# Patient Record
Sex: Male | Born: 1945
Health system: Southern US, Community
[De-identification: ages and names within clinical notes are randomized; demographics above are authoritative.]

## PROBLEM LIST (undated history)

## (undated) DIAGNOSIS — Z87442 Personal history of urinary calculi: Secondary | ICD-10-CM

## (undated) DIAGNOSIS — C801 Malignant (primary) neoplasm, unspecified: Secondary | ICD-10-CM

## (undated) DIAGNOSIS — L219 Seborrheic dermatitis, unspecified: Secondary | ICD-10-CM

## (undated) DIAGNOSIS — E785 Hyperlipidemia, unspecified: Secondary | ICD-10-CM

## (undated) DIAGNOSIS — D1802 Hemangioma of intracranial structures: Secondary | ICD-10-CM

## (undated) DIAGNOSIS — I10 Essential (primary) hypertension: Secondary | ICD-10-CM

## (undated) DIAGNOSIS — C4492 Squamous cell carcinoma of skin, unspecified: Secondary | ICD-10-CM

## (undated) DIAGNOSIS — I38 Endocarditis, valve unspecified: Secondary | ICD-10-CM

## (undated) DIAGNOSIS — N189 Chronic kidney disease, unspecified: Secondary | ICD-10-CM

## (undated) DIAGNOSIS — N529 Male erectile dysfunction, unspecified: Secondary | ICD-10-CM

## (undated) DIAGNOSIS — C4491 Basal cell carcinoma of skin, unspecified: Secondary | ICD-10-CM

## (undated) DIAGNOSIS — Z136 Encounter for screening for cardiovascular disorders: Secondary | ICD-10-CM

## (undated) HISTORY — DX: Essential (primary) hypertension: I10

## (undated) HISTORY — DX: Hyperlipidemia, unspecified: E78.5

## (undated) HISTORY — DX: Squamous cell carcinoma of skin, unspecified: C44.92

## (undated) HISTORY — DX: Basal cell carcinoma of skin, unspecified: C44.91

## (undated) HISTORY — PX: COLONOSCOPY: SHX174

## (undated) HISTORY — DX: Encounter for screening for cardiovascular disorders: Z13.6

## (undated) HISTORY — DX: Hemangioma of intracranial structures: D18.02

## (undated) HISTORY — DX: Male erectile dysfunction, unspecified: N52.9

## (undated) HISTORY — DX: Seborrheic dermatitis, unspecified: L21.9

---

## 2002-10-22 ENCOUNTER — Encounter: Admission: RE | Admit: 2002-10-22 | Discharge: 2002-10-22 | Payer: Self-pay | Admitting: Internal Medicine

## 2002-10-22 ENCOUNTER — Encounter: Payer: Self-pay | Admitting: Internal Medicine

## 2006-06-01 ENCOUNTER — Ambulatory Visit: Payer: Self-pay | Admitting: Internal Medicine

## 2006-08-24 ENCOUNTER — Ambulatory Visit: Payer: Self-pay | Admitting: Gastroenterology

## 2007-09-11 ENCOUNTER — Ambulatory Visit: Payer: Self-pay | Admitting: Unknown Physician Specialty

## 2008-08-15 ENCOUNTER — Emergency Department: Payer: Self-pay | Admitting: Emergency Medicine

## 2009-08-13 ENCOUNTER — Inpatient Hospital Stay: Payer: Self-pay | Admitting: Specialist

## 2010-03-04 LAB — HM COLONOSCOPY

## 2010-07-06 DIAGNOSIS — D239 Other benign neoplasm of skin, unspecified: Secondary | ICD-10-CM

## 2010-07-06 HISTORY — DX: Other benign neoplasm of skin, unspecified: D23.9

## 2010-08-18 DIAGNOSIS — L570 Actinic keratosis: Secondary | ICD-10-CM

## 2010-08-18 HISTORY — DX: Actinic keratosis: L57.0

## 2010-11-05 ENCOUNTER — Encounter: Payer: Self-pay | Admitting: Internal Medicine

## 2010-11-05 ENCOUNTER — Encounter: Payer: Self-pay | Admitting: Gastroenterology

## 2011-10-17 DIAGNOSIS — Z136 Encounter for screening for cardiovascular disorders: Secondary | ICD-10-CM

## 2011-10-17 HISTORY — DX: Encounter for screening for cardiovascular disorders: Z13.6

## 2011-10-30 ENCOUNTER — Ambulatory Visit: Payer: Self-pay | Admitting: Internal Medicine

## 2012-01-07 ENCOUNTER — Emergency Department: Payer: Self-pay | Admitting: *Deleted

## 2012-01-07 LAB — URINALYSIS, COMPLETE
Bacteria: NONE SEEN
Bilirubin,UR: NEGATIVE
Glucose,UR: NEGATIVE mg/dL (ref 0–75)
Leukocyte Esterase: NEGATIVE
Nitrite: NEGATIVE
Ph: 5 (ref 4.5–8.0)
Protein: NEGATIVE
RBC,UR: 265 /HPF (ref 0–5)
Specific Gravity: 1.018 (ref 1.003–1.030)
Squamous Epithelial: NONE SEEN
WBC UR: 2 /HPF (ref 0–5)

## 2012-01-08 LAB — BASIC METABOLIC PANEL
Anion Gap: 13 (ref 7–16)
BUN: 20 mg/dL — ABNORMAL HIGH (ref 7–18)
Calcium, Total: 8.7 mg/dL (ref 8.5–10.1)
Chloride: 107 mmol/L (ref 98–107)
Co2: 22 mmol/L (ref 21–32)
Creatinine: 1.04 mg/dL (ref 0.60–1.30)
EGFR (African American): >60
EGFR (Non-African Amer.): >60
Glucose: 118 mg/dL — ABNORMAL HIGH (ref 65–99)
Osmolality: 287 (ref 275–301)
Potassium: 3.6 mmol/L (ref 3.5–5.1)
Sodium: 142 mmol/L (ref 136–145)

## 2012-01-08 LAB — CBC WITH DIFFERENTIAL/PLATELET
Basophil #: 0 10*3/uL (ref 0.0–0.1)
Basophil %: 0.2 %
Eosinophil #: 0.1 10*3/uL (ref 0.0–0.7)
Eosinophil %: 0.8 %
HCT: 50.8 % (ref 40.0–52.0)
HGB: 17 g/dL (ref 13.0–18.0)
Lymphocyte #: 1.9 10*3/uL (ref 1.0–3.6)
Lymphocyte %: 21.7 %
MCH: 29.2 pg (ref 26.0–34.0)
MCHC: 33.4 g/dL (ref 32.0–36.0)
MCV: 87 fL (ref 80–100)
Monocyte #: 0.7 10*3/uL (ref 0.0–0.7)
Monocyte %: 8.1 %
Neutrophil #: 6 10*3/uL (ref 1.4–6.5)
Neutrophil %: 69.2 %
Platelet: 150 10*3/uL (ref 150–440)
RBC: 5.82 10*6/uL (ref 4.40–5.90)
RDW: 14 % (ref 11.5–14.5)
WBC: 8.7 10*3/uL (ref 3.8–10.6)

## 2012-03-04 ENCOUNTER — Encounter: Payer: Self-pay | Admitting: Internal Medicine

## 2012-03-04 ENCOUNTER — Ambulatory Visit (INDEPENDENT_AMBULATORY_CARE_PROVIDER_SITE_OTHER): Payer: Managed Care, Other (non HMO) | Admitting: Internal Medicine

## 2012-03-04 VITALS — BP 159/111 | HR 85 | Temp 97.9°F | Resp 16 | Ht 70.0 in | Wt 250.8 lb

## 2012-03-04 DIAGNOSIS — E785 Hyperlipidemia, unspecified: Secondary | ICD-10-CM

## 2012-03-04 DIAGNOSIS — N529 Male erectile dysfunction, unspecified: Secondary | ICD-10-CM | POA: Insufficient documentation

## 2012-03-04 DIAGNOSIS — I10 Essential (primary) hypertension: Secondary | ICD-10-CM

## 2012-03-04 NOTE — Assessment & Plan Note (Signed)
Blood pressure elevated today. Patient will check blood pressure at home and e-mail with readings. He will continue current medications. He will return to clinic in one month for blood pressure recheck. We will obtain recent lab work including renal function from his primary care physician.

## 2012-03-04 NOTE — Assessment & Plan Note (Signed)
Will obtain records on previous evaluation. We discussed the potential risk and benefits of medication such as Viagra. 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 He will email or call when he would like to set up this referral.

## 2012-03-04 NOTE — Assessment & Plan Note (Signed)
Will get records on recent lipid profile. Goal LDL<100. Continue statin.

## 2012-03-04 NOTE — Progress Notes (Signed)
  Subjective:    Patient ID: Jesus Peterson, male    DOB: 1946-05-19, 66 y.o.   MRN: [id number]  HPI 66 year old male with history of hypertension and erectile dysfunction presents to establish care. Regards to his hypertension, he reports that his blood pressure is typically well-controlled, typically 120-130/80-90. He denies any headache, chest pain, palpitations. He reports full compliance with his medication.  Regards to erectile dysfunction, he reports both difficulty having and maintaining an erection. 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 He would like to discuss this further with another urologist for a second opinion.  Outpatient Encounter Prescriptions as of 03/04/2012  Medication Sig Dispense Refill  . amLODipine (NORVASC) 10 MG tablet Take 10 mg by mouth daily.      Marland Kitchen aspirin 81 MG tablet Take 81 mg by mouth daily.      Marland Kitchen PRESCRIPTION MEDICATION Take 1 each by mouth daily. Diuretic for BP.      . simvastatin (ZOCOR) 40 MG tablet Take 20 mg by mouth every evening.         BP 159/111  Pulse 85  Temp(Src) 97.9 F (36.6 C) (Oral)  Resp 16  Ht 5\' 10"  (1.778 m)  Wt 250 lb 12 oz (113.739 kg)  BMI 35.98 kg/m2  SpO2 98%  Review of Systems  Constitutional: Negative for fever, chills, activity change, appetite change, fatigue and unexpected weight change.  Eyes: Negative for visual disturbance.  Respiratory: Negative for cough and shortness of breath.   Cardiovascular: Negative for chest pain, palpitations and leg swelling.  Gastrointestinal: Negative for abdominal pain and abdominal distention.  Genitourinary: Negative for dysuria, urgency and difficulty urinating.  Musculoskeletal: Negative for arthralgias and gait problem.  Skin: Negative for color change and rash.  Hematological: Negative for adenopathy.    Psychiatric/Behavioral: Negative for sleep disturbance and dysphoric mood. The patient is not nervous/anxious.        Objective:   Physical Exam  Constitutional: He is oriented to person, place, and time. He appears well-developed and well-nourished. No distress.  HENT:  Head: Normocephalic and atraumatic.  Right Ear: External ear normal.  Left Ear: External ear normal.  Nose: Nose normal.  Mouth/Throat: Oropharynx is clear and moist. No oropharyngeal exudate.  Eyes: Conjunctivae and EOM are normal. Pupils are equal, round, and reactive to light. Right eye exhibits no discharge. Left eye exhibits no discharge. No scleral icterus.  Neck: Normal range of motion. Neck supple. No tracheal deviation present. No thyromegaly present.  Cardiovascular: Normal rate, regular rhythm and normal heart sounds.  Exam reveals no gallop and no friction rub.   No murmur heard. Pulmonary/Chest: Effort normal and breath sounds normal. No respiratory distress. He has no wheezes. He has no rales. He exhibits no tenderness.  Musculoskeletal: Normal range of motion. He exhibits no edema.  Lymphadenopathy:    He has no cervical adenopathy.  Neurological: He is alert and oriented to person, place, and time. No cranial nerve deficit. Coordination normal.  Skin: Skin is warm and dry. No rash noted. He is not diaphoretic. No erythema. No pallor.  Psychiatric: He has a normal mood and affect. His behavior is normal. Judgment and thought content normal.          Assessment & Plan:

## 2012-03-12 ENCOUNTER — Encounter: Payer: Self-pay | Admitting: Internal Medicine

## 2012-04-05 ENCOUNTER — Encounter: Payer: Self-pay | Admitting: Internal Medicine

## 2012-04-05 ENCOUNTER — Ambulatory Visit (INDEPENDENT_AMBULATORY_CARE_PROVIDER_SITE_OTHER): Payer: Managed Care, Other (non HMO) | Admitting: Internal Medicine

## 2012-04-05 VITALS — BP 160/100 | HR 76 | Temp 98.3°F | Ht 70.0 in | Wt 249.5 lb

## 2012-04-05 DIAGNOSIS — K219 Gastro-esophageal reflux disease without esophagitis: Secondary | ICD-10-CM | POA: Insufficient documentation

## 2012-04-05 DIAGNOSIS — I10 Essential (primary) hypertension: Secondary | ICD-10-CM

## 2012-04-05 LAB — COMPREHENSIVE METABOLIC PANEL
ALT: 34 U/L (ref 0–53)
AST: 27 U/L (ref 0–37)
Albumin: 4.1 g/dL (ref 3.5–5.2)
Alkaline Phosphatase: 76 U/L (ref 39–117)
BUN: 20 mg/dL (ref 6–23)
CO2: 22 mEq/L (ref 19–32)
Calcium: 9.1 mg/dL (ref 8.4–10.5)
Chloride: 107 mEq/L (ref 96–112)
Creatinine, Ser: 1.1 mg/dL (ref 0.4–1.5)
GFR: 70.23 mL/min (ref >60.00)
Glucose, Bld: 129 mg/dL — ABNORMAL HIGH (ref 70–99)
Potassium: 3.8 mEq/L (ref 3.5–5.1)
Sodium: 140 mEq/L (ref 135–145)
Total Bilirubin: 1 mg/dL (ref 0.3–1.2)
Total Protein: 7.1 g/dL (ref 6.0–8.3)

## 2012-04-05 MED ORDER — DEXLANSOPRAZOLE 60 MG PO CPDR: 60.0000 mg | DELAYED_RELEASE_CAPSULE | Freq: Every day | ORAL | Status: DC

## 2012-04-05 MED ORDER — CARVEDILOL 6.25 MG PO TABS: 6.2500 mg | ORAL_TABLET | Freq: Two times a day (BID) | ORAL | Status: DC

## 2012-04-05 NOTE — Assessment & Plan Note (Signed)
Will add carvedilol. Will continue amlodipine and losartan. We'll check renal function with labs today. Followup one month. If no improvement, would consider renal artery ultrasound for evaluation of refractory hypertension.

## 2012-04-05 NOTE — Progress Notes (Signed)
Subjective:    Patient ID: Jesus Peterson, male    DOB: July 11, 1946, 66 y.o.   MRN: [id number]  HPI 66 year old male with history of hypertension presents for followup. He reports that blood pressure has been running around 150-160/100. He denies any chest pain, headache, palpitations. He reports full compliance with his losartan and amlodipine.  He is also concerned today about persistent acid reflux symptoms. He notes that his cardiologist had put him on proton next approximately 8 months ago. He took this medicine for about one month in symptoms were improved but not completely resolved. He stopped taking the medicine because he was concerned about potential side effects. He now complains of mid epigastric pain and reflux symptoms on a daily basis. This is made worse by certain foods, such as fried foods. He denies any nausea, vomiting, change in bowel habits.  Outpatient Encounter Prescriptions as of 04/05/2012  Medication Sig Dispense Refill  . amLODipine (NORVASC) 10 MG tablet Take 10 mg by mouth daily.      Marland Kitchen aspirin 81 MG tablet Take 81 mg by mouth daily.      . hydrocortisone 2.5 % cream Apply 1 application topically 2 (two) times daily.      Valerie Salts Polysacch (ALCORTIN A) 1-2-1 % GEL Apply 1 application topically daily.      Marland Kitchen ketoconazole (NIZORAL) 2 % cream Apply 1 application topically 2 (two) times daily.      Marland Kitchen losartan (COZAAR) 100 MG tablet Take 100 mg by mouth daily.      Marland Kitchen PRESCRIPTION MEDICATION Take 1 each by mouth daily. Diuretic for BP.      . simvastatin (ZOCOR) 40 MG tablet Take 20 mg by mouth every evening.      . carvedilol (COREG) 6.25 MG tablet Take 1 tablet (6.25 mg total) by mouth 2 (two) times daily with a meal.  60 tablet  3  . dexlansoprazole (DEXILANT) 60 MG capsule Take 1 capsule (60 mg total) by mouth daily.  30 capsule  3   Review of Systems  Constitutional: Negative for fever, chills, activity change, appetite change, fatigue and unexpected  weight change.  Eyes: Negative for visual disturbance.  Respiratory: Negative for cough and shortness of breath.   Cardiovascular: Negative for chest pain, palpitations and leg swelling.  Gastrointestinal: Positive for abdominal pain. Negative for nausea, diarrhea, constipation, blood in stool and abdominal distention.  Genitourinary: Negative for dysuria, urgency and difficulty urinating.  Musculoskeletal: Negative for arthralgias and gait problem.  Skin: Negative for color change and rash.  Hematological: Negative for adenopathy.  Psychiatric/Behavioral: Negative for disturbed wake/sleep cycle and dysphoric mood. The patient is not nervous/anxious.    BP 160/100  Pulse 76  Temp 98.3 F (36.8 C) (Oral)  Ht 5\' 10"  (1.778 m)  Wt 249 lb 8 oz (113.172 kg)  BMI 35.80 kg/m2  SpO2 97%     Objective:   Physical Exam  Constitutional: He is oriented to person, place, and time. He appears well-developed and well-nourished. No distress.  HENT:  Head: Normocephalic and atraumatic.  Right Ear: External ear normal.  Left Ear: External ear normal.  Nose: Nose normal.  Mouth/Throat: Oropharynx is clear and moist. No oropharyngeal exudate.  Eyes: Conjunctivae and EOM are normal. Pupils are equal, round, and reactive to light. Right eye exhibits no discharge. Left eye exhibits no discharge. No scleral icterus.  Neck: Normal range of motion. Neck supple. No tracheal deviation present. No thyromegaly present.  Cardiovascular: Normal rate, regular rhythm  and normal heart sounds.  Exam reveals no gallop and no friction rub.   No murmur heard. Pulmonary/Chest: Effort normal and breath sounds normal. No respiratory distress. He has no wheezes. He has no rales. He exhibits no tenderness.  Abdominal: Soft. Bowel sounds are normal. He exhibits no distension.  Musculoskeletal: Normal range of motion. He exhibits no edema.  Lymphadenopathy:    He has no cervical adenopathy.  Neurological: He is alert and  oriented to person, place, and time. No cranial nerve deficit. Coordination normal.  Skin: Skin is warm and dry. No rash noted. He is not diaphoretic. No erythema. No pallor.  Psychiatric: He has a normal mood and affect. His behavior is normal. Judgment and thought content normal.          Assessment & Plan:

## 2012-04-05 NOTE — Patient Instructions (Addendum)
[email or web address]

## 2012-04-05 NOTE — Assessment & Plan Note (Signed)
Symptoms are persistent despite use of proton next. Will check for H. pylori infection. Will start excellent. If H. pylori is negative and symptoms are persistent, would favor GI evaluation with endoscopy.

## 2012-04-08 LAB — HELICOBACTER PYLORI  ANTIBODY, IGM: Helicobacter pylori, IgM: 0 U/mL (ref <9.0)

## 2012-04-12 ENCOUNTER — Ambulatory Visit: Payer: Managed Care, Other (non HMO) | Admitting: Internal Medicine

## 2012-04-17 ENCOUNTER — Telehealth: Payer: Self-pay | Admitting: *Deleted

## 2012-04-17 NOTE — Telephone Encounter (Signed)
Patient called and was advised of his lab results from 04/05/2012.  He will have A1C done at his f/u visit.

## 2012-05-03 ENCOUNTER — Encounter: Payer: Self-pay | Admitting: Internal Medicine

## 2012-05-03 ENCOUNTER — Ambulatory Visit (INDEPENDENT_AMBULATORY_CARE_PROVIDER_SITE_OTHER): Payer: Managed Care, Other (non HMO) | Admitting: Internal Medicine

## 2012-05-03 ENCOUNTER — Telehealth: Payer: Self-pay | Admitting: Internal Medicine

## 2012-05-03 VITALS — BP 160/100 | HR 67 | Temp 98.6°F | Ht 70.0 in | Wt 247.8 lb

## 2012-05-03 DIAGNOSIS — K219 Gastro-esophageal reflux disease without esophagitis: Secondary | ICD-10-CM

## 2012-05-03 DIAGNOSIS — E785 Hyperlipidemia, unspecified: Secondary | ICD-10-CM

## 2012-05-03 DIAGNOSIS — R739 Hyperglycemia, unspecified: Secondary | ICD-10-CM | POA: Insufficient documentation

## 2012-05-03 DIAGNOSIS — I10 Essential (primary) hypertension: Secondary | ICD-10-CM

## 2012-05-03 DIAGNOSIS — R7309 Other abnormal glucose: Secondary | ICD-10-CM

## 2012-05-03 LAB — HEMOGLOBIN A1C: Hgb A1c MFr Bld: 6.4 % (ref 4.6–6.5)

## 2012-05-03 LAB — LIPID PANEL
Cholesterol: 139 mg/dL (ref 0–200)
HDL: 39 mg/dL — ABNORMAL LOW (ref >39.00)
LDL Cholesterol: 65 mg/dL (ref 0–99)
Total CHOL/HDL Ratio: 4
Triglycerides: 173 mg/dL — ABNORMAL HIGH (ref 0.0–149.0)
VLDL: 34.6 mg/dL (ref 0.0–40.0)

## 2012-05-03 LAB — COMPREHENSIVE METABOLIC PANEL
ALT: 28 U/L (ref 0–53)
AST: 24 U/L (ref 0–37)
Albumin: 4.2 g/dL (ref 3.5–5.2)
Alkaline Phosphatase: 72 U/L (ref 39–117)
BUN: 18 mg/dL (ref 6–23)
CO2: 22 mEq/L (ref 19–32)
Calcium: 9 mg/dL (ref 8.4–10.5)
Chloride: 111 mEq/L (ref 96–112)
Creatinine, Ser: 1.1 mg/dL (ref 0.4–1.5)
GFR: 73.25 mL/min (ref >60.00)
Glucose, Bld: 139 mg/dL — ABNORMAL HIGH (ref 70–99)
Potassium: 4.1 mEq/L (ref 3.5–5.1)
Sodium: 141 mEq/L (ref 135–145)
Total Bilirubin: 0.7 mg/dL (ref 0.3–1.2)
Total Protein: 7.2 g/dL (ref 6.0–8.3)

## 2012-05-03 MED ORDER — HYDROCHLOROTHIAZIDE 12.5 MG PO TABS: 12.5000 mg | ORAL_TABLET | Freq: Every day | ORAL | Status: DC

## 2012-05-03 NOTE — Assessment & Plan Note (Signed)
Blood pressure elevated today. Will add hydrochlorothiazide back to his regimen. He will monitor blood pressure at home. Blood pressure consistently greater than 140/90, he will e-mail or call. Otherwise, followup in 3 months.

## 2012-05-03 NOTE — Telephone Encounter (Signed)
Follow up.

## 2012-05-03 NOTE — Assessment & Plan Note (Signed)
Hyperglycemia noted on labs with blood sugar of 129. Will check A1c with labs today.

## 2012-05-03 NOTE — Progress Notes (Signed)
Subjective:    Patient ID: Jesus Peterson, male    DOB: [birth year], 66 y.o.   MRN: [id number]  HPI 66 year old male with history of hypertension, hyperlipidemia, and obesity presents for followup. He reports that he was recently seen by his cardiologist and blood pressure here was 150/90. He reports full compliance with his medication. He denies any chest pain, palpitations, headache.  On recent bloodwork he was noted to have elevated blood sugar of 129. However, this was not fasting. He has never been diagnosed with diabetes. He does have a strong family history of diabetes. He has tried to make improvements in his diet and has eliminated fried foods. He is trying to increase physical activity with goal of losing weight.  Outpatient Encounter Prescriptions as of 05/03/2012  Medication Sig Dispense Refill  . amLODipine (NORVASC) 10 MG tablet Take 10 mg by mouth daily.      Marland Kitchen aspirin 81 MG tablet Take 81 mg by mouth daily.      . carvedilol (COREG) 6.25 MG tablet Take 1 tablet (6.25 mg total) by mouth 2 (two) times daily with a meal.  60 tablet  3  . hydrocortisone 2.5 % cream Apply 1 application topically 2 (two) times daily.      Valerie Salts Polysacch (ALCORTIN A) 1-2-1 % GEL Apply 1 application topically daily.      Marland Kitchen ketoconazole (NIZORAL) 2 % cream Apply 1 application topically 2 (two) times daily.      Marland Kitchen losartan (COZAAR) 100 MG tablet Take 100 mg by mouth daily.      . simvastatin (ZOCOR) 40 MG tablet Take 20 mg by mouth every evening.      Marland Kitchen dexlansoprazole (DEXILANT) 60 MG capsule Take 1 capsule (60 mg total) by mouth daily.  30 capsule  3  . hydrochlorothiazide (HYDRODIURIL) 12.5 MG tablet Take 1 tablet (12.5 mg total) by mouth daily.  90 tablet  3  . DISCONTD: PRESCRIPTION MEDICATION Take 1 each by mouth daily. Diuretic for BP.        Review of Systems  Constitutional: Negative for fever, chills, activity change, appetite change, fatigue and unexpected weight change.    Eyes: Negative for visual disturbance.  Respiratory: Negative for cough and shortness of breath.   Cardiovascular: Negative for chest pain, palpitations and leg swelling.  Gastrointestinal: Negative for abdominal pain and abdominal distention.  Genitourinary: Negative for dysuria, urgency and difficulty urinating.  Musculoskeletal: Negative for arthralgias and gait problem.  Skin: Negative for color change and rash.  Hematological: Negative for adenopathy.  Psychiatric/Behavioral: Negative for disturbed wake/sleep cycle and dysphoric mood. The patient is not nervous/anxious.    BP 160/100  Pulse 67  Temp 98.6 F (37 C) (Oral)  Ht 5\' 10"  (1.778 m)  Wt 247 lb 12 oz (112.379 kg)  BMI 35.55 kg/m2  SpO2 98%     Objective:   Physical Exam  Constitutional: He is oriented to person, place, and time. He appears well-developed and well-nourished. No distress.  HENT:  Head: Normocephalic and atraumatic.  Right Ear: External ear normal.  Left Ear: External ear normal.  Nose: Nose normal.  Mouth/Throat: Oropharynx is clear and moist. No oropharyngeal exudate.  Eyes: Conjunctivae and EOM are normal. Pupils are equal, round, and reactive to light. Right eye exhibits no discharge. Left eye exhibits no discharge. No scleral icterus.  Neck: Normal range of motion. Neck supple. No tracheal deviation present. No thyromegaly present.  Cardiovascular: Normal rate, regular rhythm and normal heart sounds.  Exam reveals no gallop and no friction rub.   No murmur heard. Pulmonary/Chest: Effort normal and breath sounds normal. No respiratory distress. He has no wheezes. He has no rales. He exhibits no tenderness.  Musculoskeletal: Normal range of motion. He exhibits no edema.  Lymphadenopathy:    He has no cervical adenopathy.  Neurological: He is alert and oriented to person, place, and time. No cranial nerve deficit. Coordination normal.  Skin: Skin is warm and dry. No rash noted. He is not  diaphoretic. No erythema. No pallor.  Psychiatric: He has a normal mood and affect. His behavior is normal. Judgment and thought content normal.          Assessment & Plan:

## 2012-05-03 NOTE — Assessment & Plan Note (Signed)
Symptoms well controlled with Dexilant. Samples given today.

## 2012-05-03 NOTE — Assessment & Plan Note (Signed)
Will check lipids and LFTs with labs today. Continue simvastatin. 

## 2012-05-14 ENCOUNTER — Other Ambulatory Visit: Payer: Self-pay | Admitting: *Deleted

## 2012-05-14 MED ORDER — AMLODIPINE BESYLATE 10 MG PO TABS: 10.0000 mg | ORAL_TABLET | Freq: Every day | ORAL | Status: DC

## 2012-05-26 ENCOUNTER — Encounter: Payer: Self-pay | Admitting: Internal Medicine

## 2012-06-11 ENCOUNTER — Other Ambulatory Visit: Payer: Self-pay | Admitting: *Deleted

## 2012-06-11 MED ORDER — SIMVASTATIN 40 MG PO TABS: 20.0000 mg | ORAL_TABLET | Freq: Every evening | ORAL | Status: DC

## 2012-06-14 ENCOUNTER — Other Ambulatory Visit: Payer: Self-pay | Admitting: *Deleted

## 2012-06-14 DIAGNOSIS — I10 Essential (primary) hypertension: Secondary | ICD-10-CM

## 2012-06-14 MED ORDER — CARVEDILOL 6.25 MG PO TABS: 6.2500 mg | ORAL_TABLET | Freq: Two times a day (BID) | ORAL | Status: DC

## 2012-08-02 ENCOUNTER — Encounter: Payer: Self-pay | Admitting: Internal Medicine

## 2012-08-02 ENCOUNTER — Ambulatory Visit (INDEPENDENT_AMBULATORY_CARE_PROVIDER_SITE_OTHER): Payer: Managed Care, Other (non HMO) | Admitting: Internal Medicine

## 2012-08-02 VITALS — BP 160/98 | HR 64 | Temp 97.7°F | Ht 70.0 in | Wt 248.0 lb

## 2012-08-02 DIAGNOSIS — I10 Essential (primary) hypertension: Secondary | ICD-10-CM

## 2012-08-02 DIAGNOSIS — G4710 Hypersomnia, unspecified: Secondary | ICD-10-CM

## 2012-08-02 DIAGNOSIS — R7309 Other abnormal glucose: Secondary | ICD-10-CM

## 2012-08-02 DIAGNOSIS — R739 Hyperglycemia, unspecified: Secondary | ICD-10-CM

## 2012-08-02 DIAGNOSIS — Z23 Encounter for immunization: Secondary | ICD-10-CM

## 2012-08-02 DIAGNOSIS — R400 Somnolence: Secondary | ICD-10-CM

## 2012-08-02 LAB — MICROALBUMIN / CREATININE URINE RATIO
Creatinine,U: 238.7 mg/dL
Microalb Creat Ratio: 0.5 mg/g (ref 0.0–30.0)
Microalb, Ur: 1.3 mg/dL (ref 0.0–1.9)

## 2012-08-02 NOTE — Assessment & Plan Note (Signed)
Patient is concerned about symptoms of daytime somnolence and poor sleep. Symptoms are concerning for sleep apnea. Will set up sleep study. Patient has been unable to tolerate inpatient sleep study in the past so will try performing a home study. Follow up 3 months.

## 2012-08-02 NOTE — Progress Notes (Signed)
Subjective:    Patient ID: Jesus Peterson, male    DOB: 26-Jul-1946, 66 y.o.   MRN: [id number]  HPI 66 year old male with history of hypertension, hyperlipidemia, GERD presents for followup. He reports he is generally been doing well. He reports that blood pressures at home are typically 130s over 80s. He denies any headache, chest pain, palpitations. He has been trying to limit intake of salty and fried foods. He has not yet started an exercise program.  He is concerned today about poor sleep and daytime fatigue. This has been ongoing for several months. He tried to get a sleep study in the past but was unable to complete an inpatient sleep study because of difficulty falling asleep outside of his home. He is interested in doing an in home sleep study to evaluate for sleep apnea.  Outpatient Encounter Prescriptions as of 08/02/2012  Medication Sig Dispense Refill  . amLODipine (NORVASC) 10 MG tablet Take 1 tablet (10 mg total) by mouth daily.  90 tablet  3  . aspirin 81 MG tablet Take 81 mg by mouth daily.      . carvedilol (COREG) 6.25 MG tablet Take 1 tablet (6.25 mg total) by mouth 2 (two) times daily with a meal.  180 tablet  1  . hydrochlorothiazide (HYDRODIURIL) 12.5 MG tablet Take 1 tablet (12.5 mg total) by mouth daily.  90 tablet  3  . hydrocortisone 2.5 % cream Apply 1 application topically 2 (two) times daily.      Valerie Salts Polysacch (ALCORTIN A) 1-2-1 % GEL Apply 1 application topically daily.      Marland Kitchen ketoconazole (NIZORAL) 2 % cream Apply 1 application topically 2 (two) times daily.      . simvastatin (ZOCOR) 40 MG tablet Take 0.5 tablets (20 mg total) by mouth every evening.  90 tablet  1  . dexlansoprazole (DEXILANT) 60 MG capsule Take 1 capsule (60 mg total) by mouth daily.  30 capsule  3  . losartan (COZAAR) 100 MG tablet Take 100 mg by mouth daily.        Review of Systems  Constitutional: Negative for fever, chills, activity change, appetite change, fatigue and  unexpected weight change.  Eyes: Negative for visual disturbance.  Respiratory: Negative for cough and shortness of breath.   Cardiovascular: Negative for chest pain, palpitations and leg swelling.  Gastrointestinal: Negative for abdominal pain and abdominal distention.  Genitourinary: Negative for dysuria, urgency and difficulty urinating.  Musculoskeletal: Negative for arthralgias and gait problem.  Skin: Negative for color change and rash.  Hematological: Negative for adenopathy.  Psychiatric/Behavioral: Negative for disturbed wake/sleep cycle and dysphoric mood. The patient is not nervous/anxious.        Objective:   Physical Exam  Constitutional: He is oriented to person, place, and time. He appears well-developed and well-nourished. No distress.  HENT:  Head: Normocephalic and atraumatic.  Right Ear: External ear normal.  Left Ear: External ear normal.  Nose: Nose normal.  Mouth/Throat: Oropharynx is clear and moist. No oropharyngeal exudate.  Eyes: Conjunctivae normal and EOM are normal. Pupils are equal, round, and reactive to light. Right eye exhibits no discharge. Left eye exhibits no discharge. No scleral icterus.  Neck: Normal range of motion. Neck supple. No tracheal deviation present. No thyromegaly present.  Cardiovascular: Normal rate, regular rhythm and normal heart sounds.  Exam reveals no gallop and no friction rub.   No murmur heard. Pulmonary/Chest: Effort normal and breath sounds normal. No respiratory distress. He has no wheezes.  He has no rales. He exhibits no tenderness.  Musculoskeletal: Normal range of motion. He exhibits no edema.  Lymphadenopathy:    He has no cervical adenopathy.  Neurological: He is alert and oriented to person, place, and time. No cranial nerve deficit. Coordination normal.  Skin: Skin is warm and dry. No rash noted. He is not diaphoretic. No erythema. No pallor.  Psychiatric: He has a normal mood and affect. His behavior is normal.  Judgment and thought content normal.          Assessment & Plan:

## 2012-08-02 NOTE — Assessment & Plan Note (Signed)
Blood pressure has been well controlled at home on current medications. Blood pressure is elevated today likely because patient did not take his medicines this morning. Will continue to monitor. Will check renal function with labs today. Patient will call or return to clinic if blood pressure consistently greater than 140/90.

## 2012-08-02 NOTE — Assessment & Plan Note (Signed)
History of borderline diabetes in the past. We'll recheck A1c with labs today. Followup in 3 months or sooner as needed.

## 2012-08-05 ENCOUNTER — Telehealth: Payer: Self-pay | Admitting: Internal Medicine

## 2012-08-05 LAB — COMPREHENSIVE METABOLIC PANEL
ALT: 21 U/L (ref 0–53)
AST: 20 U/L (ref 0–37)
Albumin: 3.7 g/dL (ref 3.5–5.2)
Alkaline Phosphatase: 65 U/L (ref 39–117)
BUN: 19 mg/dL (ref 6–23)
CO2: 27 mEq/L (ref 19–32)
Calcium: 9 mg/dL (ref 8.4–10.5)
Chloride: 106 mEq/L (ref 96–112)
Creatinine, Ser: 1 mg/dL (ref 0.4–1.5)
GFR: 83.96 mL/min (ref >60.00)
Glucose, Bld: 147 mg/dL — ABNORMAL HIGH (ref 70–99)
Potassium: 3.5 mEq/L (ref 3.5–5.1)
Sodium: 139 mEq/L (ref 135–145)
Total Bilirubin: 0.5 mg/dL (ref 0.3–1.2)
Total Protein: 6.8 g/dL (ref 6.0–8.3)

## 2012-08-05 LAB — HEMOGLOBIN A1C: Hgb A1c MFr Bld: 6.3 % (ref 4.6–6.5)

## 2012-08-05 NOTE — Telephone Encounter (Signed)
Christy @ [facility] left message about Mr Camper order for sleep study.  She had questions about order and his insurance

## 2012-08-09 ENCOUNTER — Encounter: Payer: Self-pay | Admitting: Internal Medicine

## 2012-08-21 ENCOUNTER — Encounter: Payer: Self-pay | Admitting: Internal Medicine

## 2012-08-23 ENCOUNTER — Ambulatory Visit: Payer: Self-pay | Admitting: Unknown Physician Specialty

## 2012-08-27 ENCOUNTER — Encounter: Payer: Self-pay | Admitting: Internal Medicine

## 2012-08-27 LAB — PATHOLOGY REPORT

## 2012-10-20 ENCOUNTER — Telehealth: Payer: Self-pay | Admitting: Internal Medicine

## 2012-10-20 NOTE — Telephone Encounter (Signed)
Sleep study - severe sleep apnea. Please confirm that CPAP titration study was scheduled.

## 2012-10-21 NOTE — Telephone Encounter (Signed)
Patient advised as instructed via telephone, he stated that he had the CPAP titration test done last week and we should be getting the results soon.  He stated that he can just wait until his next visit to follow up and discuss.

## 2012-10-21 NOTE — Telephone Encounter (Signed)
Left message on patient's voicemail.

## 2012-11-08 ENCOUNTER — Encounter: Payer: Self-pay | Admitting: Internal Medicine

## 2012-11-08 ENCOUNTER — Ambulatory Visit (INDEPENDENT_AMBULATORY_CARE_PROVIDER_SITE_OTHER): Payer: Managed Care, Other (non HMO) | Admitting: Internal Medicine

## 2012-11-08 VITALS — BP 140/90 | HR 92 | Temp 98.2°F | Resp 16 | Ht 70.0 in | Wt 244.0 lb

## 2012-11-08 DIAGNOSIS — I10 Essential (primary) hypertension: Secondary | ICD-10-CM

## 2012-11-08 DIAGNOSIS — Z0000 Encounter for general adult medical examination without abnormal findings: Secondary | ICD-10-CM

## 2012-11-08 DIAGNOSIS — G4730 Sleep apnea, unspecified: Secondary | ICD-10-CM

## 2012-11-08 DIAGNOSIS — R350 Frequency of micturition: Secondary | ICD-10-CM

## 2012-11-08 LAB — POCT URINALYSIS DIPSTICK
Bilirubin, UA: NEGATIVE
Blood, UA: NEGATIVE
Glucose, UA: NEGATIVE
Ketones, UA: NEGATIVE
Leukocytes, UA: NEGATIVE
Nitrite, UA: NEGATIVE
Protein, UA: NEGATIVE
Spec Grav, UA: 1.02
Urobilinogen, UA: 1
pH, UA: 6.5

## 2012-11-08 NOTE — Assessment & Plan Note (Signed)
  BP Readings from Last 3 Encounters:  11/08/12 140/90  08/02/12 160/98  05/03/12 160/100   Blood pressure continues to be slightly elevated, however better controlled at home. Will monitor for now. Consider increasing dose of carvedilol to 12.5 twice daily if persistent blood pressure elevation of followup.

## 2012-11-08 NOTE — Progress Notes (Signed)
Subjective:    Patient ID: Jesus Peterson, male    DOB: Feb 09, 1946, 67 y.o.   MRN: [id number]  HPI 67 year old male with history of hypertension, hyperlipidemia, daytime somnolence presents for followup. In the interim since his last visit, he underwent home sleep study which showed severe sleep apnea. CPAP titration has never been scheduled. He continues to have symptoms of apnea, snoring, and daytime somnolence. He reports chronic fatigue.  In regards to hypertension, he reports that blood pressure has generally been well-controlled at home. He has had some urinary frequency with use of hydrochlorothiazide. He denies any dysuria, fever, chills, flank pain. He denies any chest pain, palpitations, headache.  Outpatient Encounter Prescriptions as of 11/08/2012  Medication Sig Dispense Refill  . amLODipine (NORVASC) 10 MG tablet Take 1 tablet (10 mg total) by mouth daily.  90 tablet  3  . aspirin 81 MG tablet Take 81 mg by mouth daily.      . carvedilol (COREG) 6.25 MG tablet Take 1 tablet (6.25 mg total) by mouth 2 (two) times daily with a meal.  180 tablet  1  . hydrocortisone 2.5 % cream Apply 1 application topically 2 (two) times daily.      Valerie Salts Polysacch (ALCORTIN A) 1-2-1 % GEL Apply 1 application topically daily.      Marland Kitchen ketoconazole (NIZORAL) 2 % cream Apply 1 application topically 2 (two) times daily.      Marland Kitchen losartan (COZAAR) 100 MG tablet Take 100 mg by mouth daily.      . simvastatin (ZOCOR) 40 MG tablet Take 0.5 tablets (20 mg total) by mouth every evening.  90 tablet  1  . dexlansoprazole (DEXILANT) 60 MG capsule Take 1 capsule (60 mg total) by mouth daily.  30 capsule  3  . [DISCONTINUED] hydrochlorothiazide (HYDRODIURIL) 12.5 MG tablet Take 1 tablet (12.5 mg total) by mouth daily.  90 tablet  3   BP 140/90  Pulse 92  Temp 98.2 F (36.8 C)  Resp 16  Ht 5\' 10"  (1.778 m)  Wt 244 lb (110.678 kg)  BMI 35.01 kg/m2  SpO2 96%  Review of Systems  Constitutional:  Positive for fatigue. Negative for fever, chills, activity change, appetite change and unexpected weight change.  Eyes: Negative for visual disturbance.  Respiratory: Positive for apnea. Negative for cough and shortness of breath.   Cardiovascular: Negative for chest pain, palpitations and leg swelling.  Gastrointestinal: Negative for abdominal pain and abdominal distention.  Genitourinary: Positive for frequency. Negative for dysuria, urgency and difficulty urinating.  Musculoskeletal: Negative for arthralgias and gait problem.  Skin: Negative for color change and rash.  Hematological: Negative for adenopathy.  Psychiatric/Behavioral: Negative for sleep disturbance and dysphoric mood. The patient is not nervous/anxious.        Objective:   Physical Exam  Constitutional: He is oriented to person, place, and time. He appears well-developed and well-nourished. No distress.  HENT:  Head: Normocephalic and atraumatic.  Right Ear: External ear normal.  Left Ear: External ear normal.  Nose: Nose normal.  Mouth/Throat: Oropharynx is clear and moist. No oropharyngeal exudate.  Eyes: Conjunctivae normal and EOM are normal. Pupils are equal, round, and reactive to light. Right eye exhibits no discharge. Left eye exhibits no discharge. No scleral icterus.  Neck: Normal range of motion. Neck supple. No tracheal deviation present. No thyromegaly present.  Cardiovascular: Normal rate, regular rhythm and normal heart sounds.  Exam reveals no gallop and no friction rub.   No murmur heard. Pulmonary/Chest:  Effort normal and breath sounds normal. No respiratory distress. He has no wheezes. He has no rales. He exhibits no tenderness.  Musculoskeletal: Normal range of motion. He exhibits no edema.  Lymphadenopathy:    He has no cervical adenopathy.  Neurological: He is alert and oriented to person, place, and time. No cranial nerve deficit. Coordination normal.  Skin: Skin is warm and dry. No rash noted.  He is not diaphoretic. No erythema. No pallor.  Psychiatric: He has a normal mood and affect. His behavior is normal. Judgment and thought content normal.          Assessment & Plan:

## 2012-11-08 NOTE — Assessment & Plan Note (Signed)
Recent sleep study showed severe sleep apnea. CPAP titration was not scheduled. Will schedule today. We'll plan to start CPAP through [facility]. Follow up here in 3 months and prn.

## 2012-11-08 NOTE — Assessment & Plan Note (Signed)
Symptoms of urinary frequency. Urinalysis normal. No other symptoms such as dysuria, fever, chills, urinary hesitancy. Suspect symptoms may be related to use of hydrochlorothiazide. Patient will stop this medication and we will recheck blood pressure at followup.

## 2012-11-15 ENCOUNTER — Encounter: Payer: Self-pay | Admitting: Internal Medicine

## 2012-11-29 ENCOUNTER — Encounter: Payer: Self-pay | Admitting: Internal Medicine

## 2012-12-10 ENCOUNTER — Telehealth: Payer: Self-pay | Admitting: Internal Medicine

## 2012-12-10 DIAGNOSIS — I10 Essential (primary) hypertension: Secondary | ICD-10-CM

## 2012-12-10 MED ORDER — CARVEDILOL 6.25 MG PO TABS: 6.2500 mg | ORAL_TABLET | Freq: Two times a day (BID) | ORAL | Status: DC

## 2012-12-10 NOTE — Telephone Encounter (Signed)
Rx sent to pharmacy [facility]

## 2012-12-10 NOTE — Telephone Encounter (Signed)
° °  carvedilol (COREG) 6.25 MG tablet  #90

## 2012-12-11 ENCOUNTER — Ambulatory Visit: Payer: Managed Care, Other (non HMO) | Admitting: Adult Health

## 2012-12-11 ENCOUNTER — Telehealth: Payer: Self-pay | Admitting: Internal Medicine

## 2012-12-11 ENCOUNTER — Ambulatory Visit (INDEPENDENT_AMBULATORY_CARE_PROVIDER_SITE_OTHER): Payer: Managed Care, Other (non HMO) | Admitting: Adult Health

## 2012-12-11 ENCOUNTER — Encounter: Payer: Self-pay | Admitting: Adult Health

## 2012-12-11 VITALS — BP 120/82 | HR 80 | Temp 99.0°F | Resp 16 | Wt 245.0 lb

## 2012-12-11 DIAGNOSIS — J069 Acute upper respiratory infection, unspecified: Secondary | ICD-10-CM

## 2012-12-11 DIAGNOSIS — R6889 Other general symptoms and signs: Secondary | ICD-10-CM

## 2012-12-11 DIAGNOSIS — J111 Influenza due to unidentified influenza virus with other respiratory manifestations: Secondary | ICD-10-CM

## 2012-12-11 LAB — POCT INFLUENZA A/B
Influenza A, POC: NEGATIVE
Influenza B, POC: NEGATIVE

## 2012-12-11 MED ORDER — HYDROCOD POLST-CHLORPHEN POLST 10-8 MG/5ML PO LQCR: 5.0000 mL | Freq: Two times a day (BID) | ORAL | Status: DC | PRN

## 2012-12-11 MED ORDER — AZITHROMYCIN 250 MG PO TABS: ORAL_TABLET | ORAL | Status: DC

## 2012-12-11 NOTE — Telephone Encounter (Signed)
Pt was checking on the paperwork that [facility]  Was sending to dr walker

## 2012-12-11 NOTE — Progress Notes (Signed)
  Subjective:    Patient ID: Jesus Peterson, male    DOB: [birth year], 67 y.o.   MRN: [id number]  HPI  Patient is a pleasant 67 year old male who presents to clinic this morning with cough, sore throat, malaise. Symptoms have been ongoing for 3 days. He reports his 39-year-old granddaughter has been sick with a sinus infection. He recently spent some time with her. He reports having a low-grade temperature, chills. He denies chest pain or shortness of breath.   Current Outpatient Prescriptions on File Prior to Visit  Medication Sig Dispense Refill  . amLODipine (NORVASC) 10 MG tablet Take 1 tablet (10 mg total) by mouth daily.  90 tablet  3  . aspirin 81 MG tablet Take 81 mg by mouth daily.      . carvedilol (COREG) 6.25 MG tablet Take 1 tablet (6.25 mg total) by mouth 2 (two) times daily with a meal.  180 tablet  0  . hydrocortisone 2.5 % cream Apply 1 application topically 2 (two) times daily.      Valerie Salts Polysacch (ALCORTIN A) 1-2-1 % GEL Apply 1 application topically daily.      Marland Kitchen ketoconazole (NIZORAL) 2 % cream Apply 1 application topically 2 (two) times daily.      Marland Kitchen losartan (COZAAR) 100 MG tablet Take 100 mg by mouth daily.      . simvastatin (ZOCOR) 40 MG tablet Take 0.5 tablets (20 mg total) by mouth every evening.  90 tablet  1  . dexlansoprazole (DEXILANT) 60 MG capsule Take 1 capsule (60 mg total) by mouth daily.  30 capsule  3   No current facility-administered medications on file prior to visit.      Review of Systems  Constitutional: Positive for fever and chills.  HENT: Positive for congestion and sore throat.   Respiratory: Positive for cough. Negative for chest tightness, shortness of breath and wheezing.   Cardiovascular: Negative for chest pain.  Gastrointestinal: Negative for nausea and vomiting.  Musculoskeletal:       Malaise.  Neurological: Negative.   Psychiatric/Behavioral: Negative.     BP 120/82  Pulse 80  Temp(Src) 99 F (37.2 C)  (Oral)  Wt 245 lb (111.131 kg)  BMI 35.15 kg/m2  SpO2 96%     Objective:   Physical Exam  Constitutional: He is oriented to person, place, and time. He appears well-developed and well-nourished.  Appearing uncomfortable.  HENT:  Head: Normocephalic and atraumatic.  Right Ear: External ear normal.  Left Ear: External ear normal.  Mouth/Throat: No oropharyngeal exudate.  Pharyngeal erythema without exudate.  Cardiovascular: Normal rate, regular rhythm, normal heart sounds and intact distal pulses.  Exam reveals no gallop.   No murmur heard. Pulmonary/Chest: Effort normal and breath sounds normal. No respiratory distress. He has no wheezes. He has no rales.  Neurological: He is alert and oriented to person, place, and time.  Skin: Skin is warm and dry.  Psychiatric: He has a normal mood and affect. His behavior is normal. Judgment and thought content normal.          Assessment & Plan:

## 2012-12-11 NOTE — Patient Instructions (Addendum)
  Please start your antibiotic today.  I have also given you a prescription for Tussionex for your cough. This medication may make you sleepy.  Please call the office if your symptoms are not improved by Monday.

## 2012-12-11 NOTE — Telephone Encounter (Signed)
He had an appointment today, do you know anything about paperwork from [facility]?

## 2012-12-11 NOTE — Telephone Encounter (Signed)
I don't have any recent paperwork from [place].

## 2012-12-11 NOTE — Assessment & Plan Note (Addendum)
Patient appearing acutely ill. He has been around his granddaughter who is currently being treated for a sinus infection. Will start azithromycin. Tussionex for cough. RTC if no improvement by Monday.

## 2012-12-12 NOTE — Telephone Encounter (Addendum)
Patient informed and verbally agreed. He is going to call [facility] and have them send [place] the information

## 2012-12-23 ENCOUNTER — Telehealth: Payer: Self-pay | Admitting: Internal Medicine

## 2012-12-23 NOTE — Telephone Encounter (Signed)
Pt came in checking on the paperwork for [facility]  (sleep study) They say they are waiting on rx from dr walker

## 2013-02-05 IMAGING — CT CT STONE STUDY
1 of 2 series · 15 of 32 positions shown, 19 images · non-contrast
Comparison: none

REASON FOR EXAM: Right flank pain, radiating to right groin.  hematuria.
r/o obstructing kidney
COMMENTS:

[Series 2: 3mm soft tissue · axial · 0.83mm/px · z∈[-1156,-700]mm · 15 of 166 slices shown, 19 images]
[im 7/166  soft-tissue]
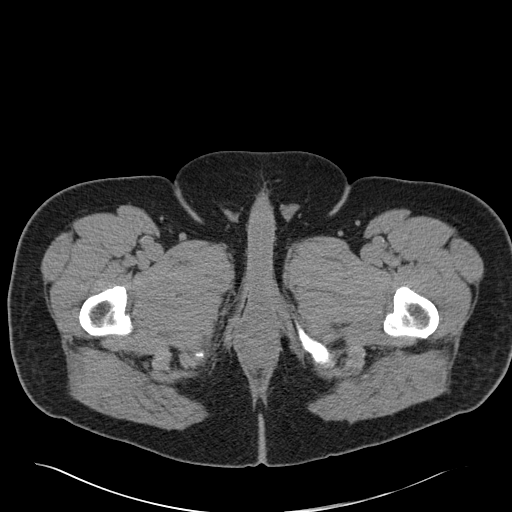
[im 7/166  bone]
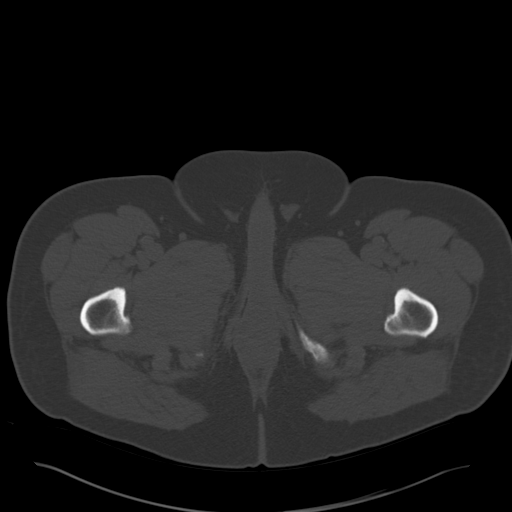
[im 21/166  soft-tissue]
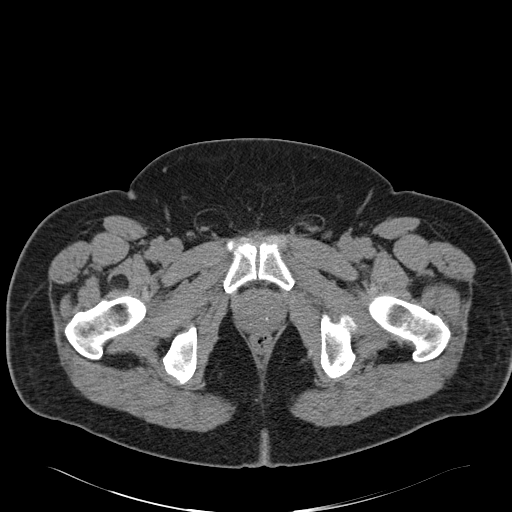
[im 35/166  soft-tissue]
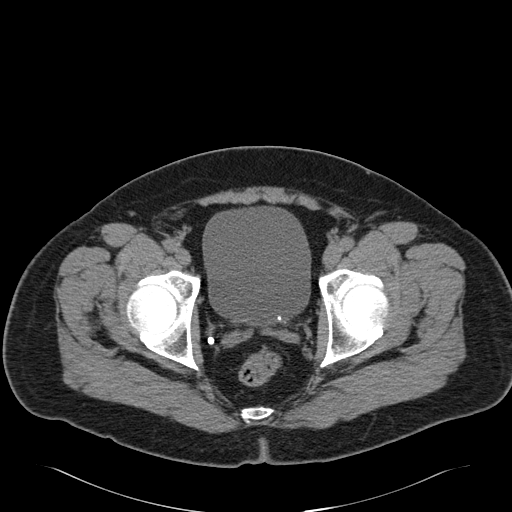
[im 49/166  soft-tissue]
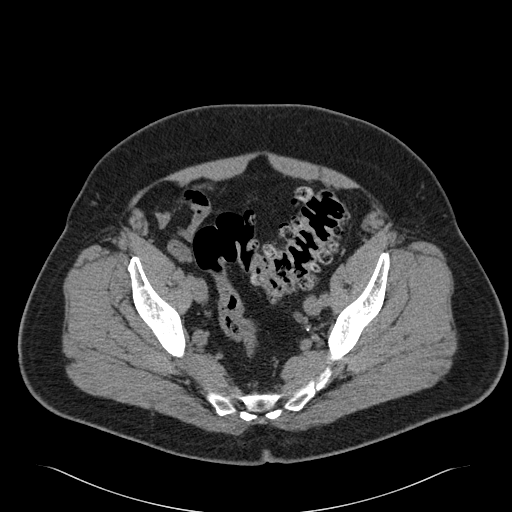
[im 56/166  soft-tissue]
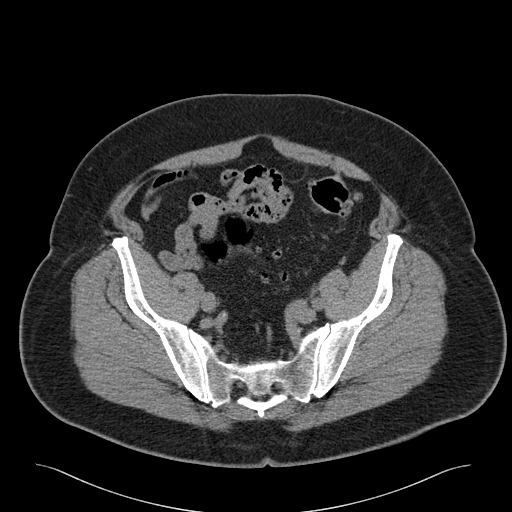
[im 69/166  soft-tissue]
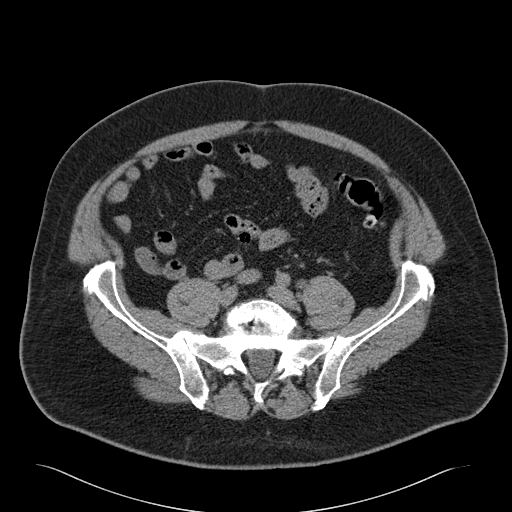
[im 83/166  soft-tissue]
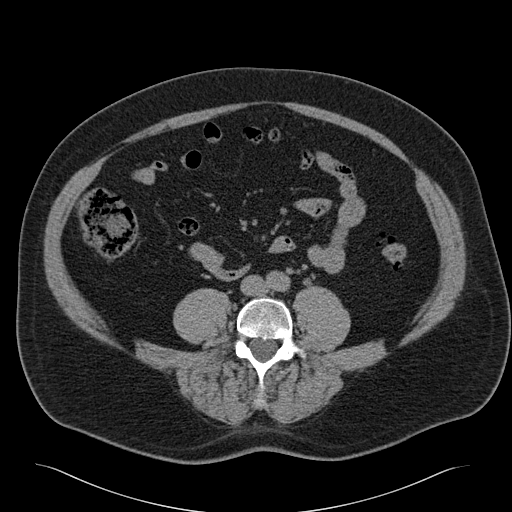
[im 97/166  soft-tissue]
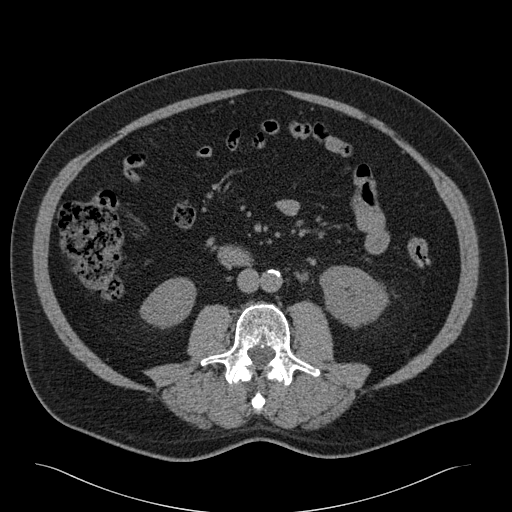
[im 111/166  soft-tissue]
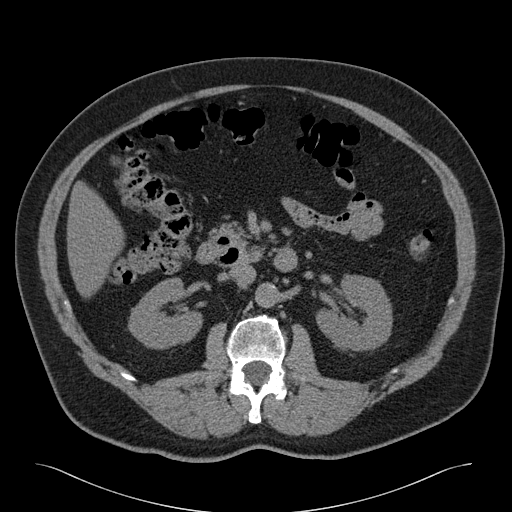
[im 111/166  bone]
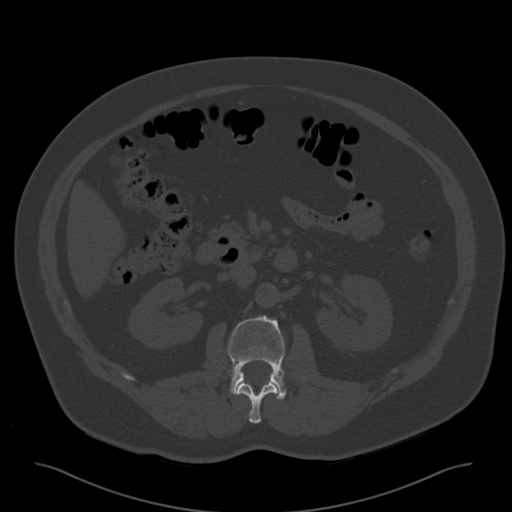
[im 117/166  soft-tissue]
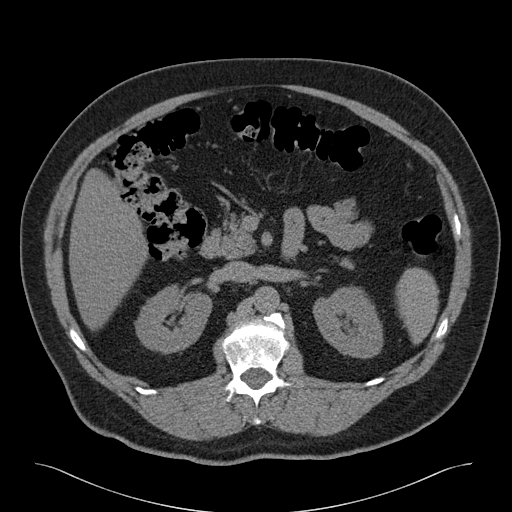
[im 131/166  soft-tissue]
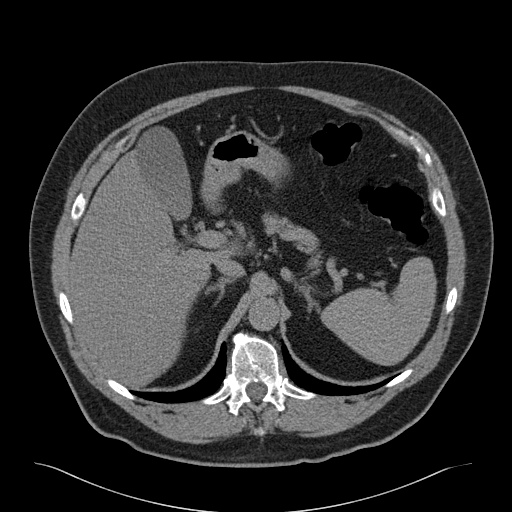
[im 138/166  lung]
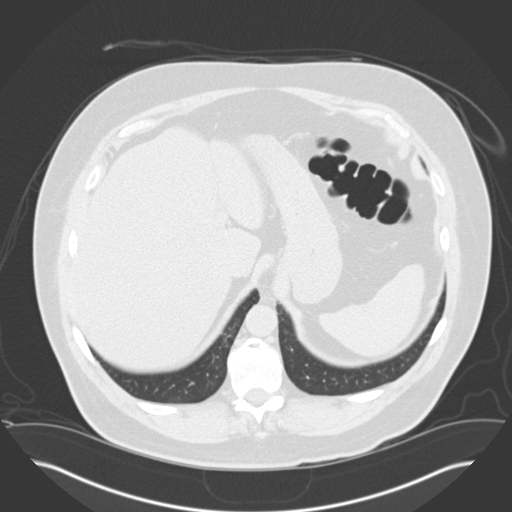
[im 145/166  soft-tissue]
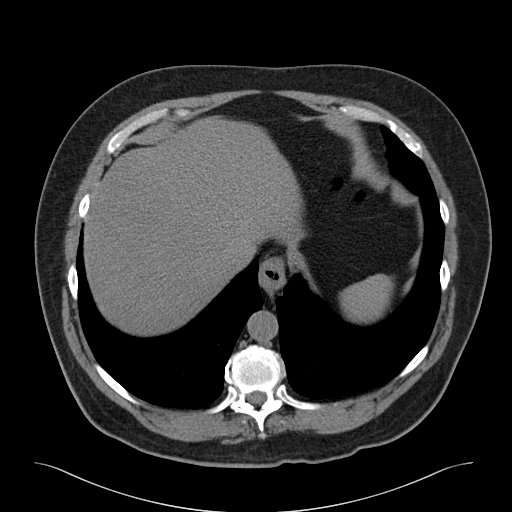
[im 145/166  lung]
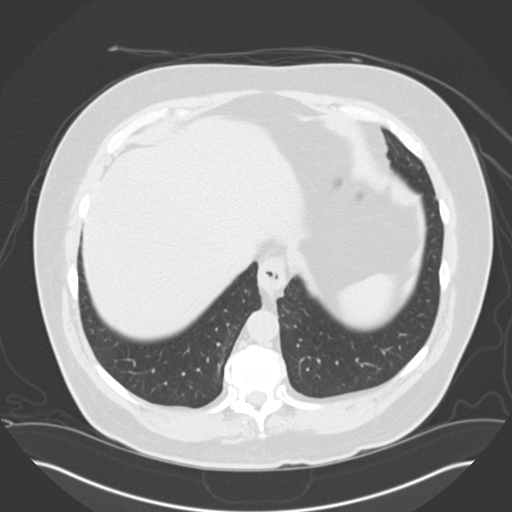
[im 152/166  lung]
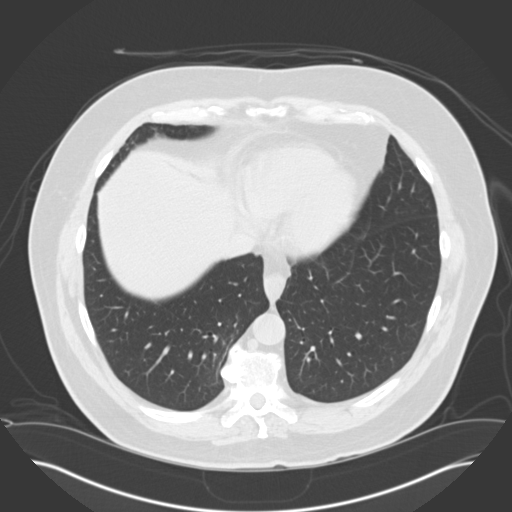
[im 159/166  soft-tissue]
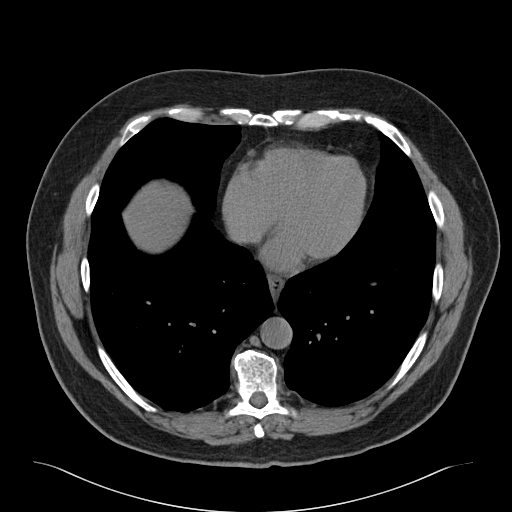
[im 159/166  lung]
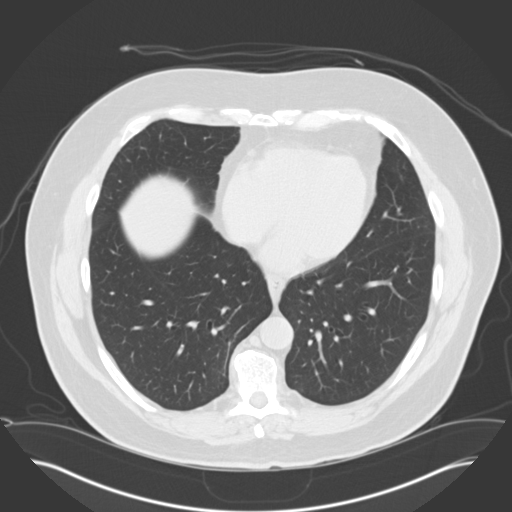

[15 of 32 positions shown; findings below may reference images not displayed]

PROCEDURE:     CT  - CT ABDOMEN /PELVIS WO (STONE)  - January 08, 2012  [DATE]

RESULT:     Axial noncontrast CT scanning was performed through the abdomen
and pelvis with reconstructions at 3 mm intervals and slice thicknesses.
Review of multiplanar reconstructed images was performed separately on the
VIA monitor.

There is mild hydronephrosis and hydroureter on the left secondary to 2 mm
diameter stone at the ureterovesical junction. It is seen on image 132. I do
not see evidence of other stones on the left. On the right I see no evidence
of stones or obstruction. The partially distended urinary bladder is normal
in appearance.

The liver, gallbladder, pancreas, spleen, nondistended stomach, adrenal
glands, and abdominal aorta exhibit no acute abnormality. The unopacified
loops of small and large bowel are normal in appearance. There is a
structure consistent with a normal appendix seen on images 88 through
98. There is no evidence of ascites. There is sigmoid diverticulosis without
evidence of acute diverticulitis. The prostate gland is mildly enlarged and
produces a prominent impression upon the urinary bladder base.

The lung bases are clear. The lumbar vertebral bodies are preserved in
height. There are are bilateral pars defects at L5 with no more than minimal
grade 1 anterolisthesis of L5 with respect to S1.
IMPRESSION: 1. There is mild hydronephrosis and hydroureter on the left secondary to 2
mm diameter stone at the UVJ.
2. There is mild enlargement of the prostate gland.
3. There is sigmoid diverticulosis without evidence of acute diverticulitis.
4. There are small fat-containing inguinal hernias bilaterally.

A preliminary report was sent to the [HOSPITAL] the conclusion
of the study.

## 2013-03-28 ENCOUNTER — Encounter: Payer: Self-pay | Admitting: Internal Medicine

## 2013-04-01 ENCOUNTER — Encounter: Payer: Self-pay | Admitting: Internal Medicine

## 2013-04-09 ENCOUNTER — Encounter: Payer: Self-pay | Admitting: Internal Medicine

## 2013-04-23 ENCOUNTER — Other Ambulatory Visit: Payer: Self-pay | Admitting: *Deleted

## 2013-04-23 DIAGNOSIS — I10 Essential (primary) hypertension: Secondary | ICD-10-CM

## 2013-04-23 MED ORDER — CARVEDILOL 6.25 MG PO TABS: 6.2500 mg | ORAL_TABLET | Freq: Two times a day (BID) | ORAL | Status: DC

## 2013-04-23 MED ORDER — SIMVASTATIN 40 MG PO TABS: 20.0000 mg | ORAL_TABLET | Freq: Every evening | ORAL | Status: DC

## 2013-04-23 NOTE — Telephone Encounter (Signed)
Eprescribed.

## 2013-04-24 ENCOUNTER — Other Ambulatory Visit: Payer: Self-pay | Admitting: Internal Medicine

## 2013-04-24 DIAGNOSIS — I10 Essential (primary) hypertension: Secondary | ICD-10-CM

## 2013-04-24 MED ORDER — CARVEDILOL 6.25 MG PO TABS: 6.2500 mg | ORAL_TABLET | Freq: Two times a day (BID) | ORAL | Status: DC

## 2013-04-24 MED ORDER — SIMVASTATIN 40 MG PO TABS: 20.0000 mg | ORAL_TABLET | Freq: Every evening | ORAL | Status: DC

## 2013-04-24 NOTE — Telephone Encounter (Signed)
Patient left voicemail stating prescription was supposed to have been sent to [facility] and not [facility]. Rx sent to [facility]

## 2013-05-01 ENCOUNTER — Encounter: Payer: Self-pay | Admitting: Internal Medicine

## 2013-05-07 ENCOUNTER — Encounter: Payer: Self-pay | Admitting: Internal Medicine

## 2013-06-04 ENCOUNTER — Other Ambulatory Visit: Payer: Self-pay | Admitting: *Deleted

## 2013-06-04 MED ORDER — AMLODIPINE BESYLATE 10 MG PO TABS: 10.0000 mg | ORAL_TABLET | Freq: Every day | ORAL | Status: DC

## 2013-08-01 ENCOUNTER — Encounter: Payer: Self-pay | Admitting: Internal Medicine

## 2013-08-06 ENCOUNTER — Telehealth: Payer: Self-pay | Admitting: Internal Medicine

## 2013-08-06 ENCOUNTER — Ambulatory Visit: Payer: Medicare Other

## 2013-08-06 NOTE — Telephone Encounter (Signed)
Patient scheduled for appt tomorrow at 730

## 2013-08-06 NOTE — Telephone Encounter (Signed)
Patient just walked in during lunch hours, no appointment was scheduled. However Jesus Peterson did go ahead and take his BP, please read below

## 2013-08-06 NOTE — Telephone Encounter (Signed)
Yes, needs a visit this week. We can work in early am.

## 2013-08-06 NOTE — Telephone Encounter (Signed)
Pt came by and had his b/p checked it was 162/81. Pt states that Dr. Dan Humphreys told him to stop by and have it checked because he was going to have dental work done but couldn't because he went in and they ran his b/p at 180/121. Pt is scheduling for sometime next week to have his gum worked on for infection. Pt was wanting to know if he needs to come in sometime before end of next week to see Dr. Dan Humphreys ??

## 2013-08-07 ENCOUNTER — Encounter: Payer: Self-pay | Admitting: Internal Medicine

## 2013-08-07 ENCOUNTER — Ambulatory Visit (INDEPENDENT_AMBULATORY_CARE_PROVIDER_SITE_OTHER): Payer: Managed Care, Other (non HMO) | Admitting: Internal Medicine

## 2013-08-07 VITALS — BP 150/90 | HR 73 | Temp 98.4°F | Ht 70.0 in | Wt 249.5 lb

## 2013-08-07 DIAGNOSIS — I10 Essential (primary) hypertension: Secondary | ICD-10-CM

## 2013-08-07 DIAGNOSIS — R7309 Other abnormal glucose: Secondary | ICD-10-CM

## 2013-08-07 DIAGNOSIS — R739 Hyperglycemia, unspecified: Secondary | ICD-10-CM

## 2013-08-07 DIAGNOSIS — K047 Periapical abscess without sinus: Secondary | ICD-10-CM

## 2013-08-07 DIAGNOSIS — Z0000 Encounter for general adult medical examination without abnormal findings: Secondary | ICD-10-CM

## 2013-08-07 LAB — MICROALBUMIN / CREATININE URINE RATIO
Creatinine,U: 150.4 mg/dL
Microalb Creat Ratio: 1 mg/g (ref 0.0–30.0)
Microalb, Ur: 1.5 mg/dL (ref 0.0–1.9)

## 2013-08-07 LAB — CBC WITH DIFFERENTIAL/PLATELET
Basophils Absolute: 0 10*3/uL (ref 0.0–0.1)
Basophils Relative: 0.3 % (ref 0.0–3.0)
Eosinophils Absolute: 0.1 10*3/uL (ref 0.0–0.7)
Eosinophils Relative: 1.2 % (ref 0.0–5.0)
HCT: 47.4 % (ref 39.0–52.0)
Hemoglobin: 16.3 g/dL (ref 13.0–17.0)
Lymphocytes Relative: 26.2 % (ref 12.0–46.0)
Lymphs Abs: 1.6 10*3/uL (ref 0.7–4.0)
MCHC: 34.4 g/dL (ref 30.0–36.0)
MCV: 86.1 fl (ref 78.0–100.0)
Monocytes Absolute: 0.5 10*3/uL (ref 0.1–1.0)
Monocytes Relative: 8.8 % (ref 3.0–12.0)
Neutro Abs: 3.8 10*3/uL (ref 1.4–7.7)
Neutrophils Relative %: 63.5 % (ref 43.0–77.0)
Platelets: 173 10*3/uL (ref 150.0–400.0)
RBC: 5.51 Mil/uL (ref 4.22–5.81)
RDW: 13.2 % (ref 11.5–14.6)
WBC: 6.1 10*3/uL (ref 4.5–10.5)

## 2013-08-07 LAB — COMPREHENSIVE METABOLIC PANEL
ALT: 35 U/L (ref 0–53)
AST: 25 U/L (ref 0–37)
Albumin: 4.2 g/dL (ref 3.5–5.2)
Alkaline Phosphatase: 85 U/L (ref 39–117)
BUN: 20 mg/dL (ref 6–23)
CO2: 26 mEq/L (ref 19–32)
Calcium: 9.6 mg/dL (ref 8.4–10.5)
Chloride: 104 mEq/L (ref 96–112)
Creatinine, Ser: 1.1 mg/dL (ref 0.4–1.5)
GFR: 74.58 mL/min (ref >60.00)
Glucose, Bld: 189 mg/dL — ABNORMAL HIGH (ref 70–99)
Potassium: 4.3 mEq/L (ref 3.5–5.1)
Sodium: 140 mEq/L (ref 135–145)
Total Bilirubin: 0.8 mg/dL (ref 0.3–1.2)
Total Protein: 7.1 g/dL (ref 6.0–8.3)

## 2013-08-07 LAB — LIPID PANEL
Cholesterol: 144 mg/dL (ref 0–200)
HDL: 39.8 mg/dL (ref >39.00)
Total CHOL/HDL Ratio: 4
Triglycerides: 212 mg/dL — ABNORMAL HIGH (ref 0.0–149.0)
VLDL: 42.4 mg/dL — ABNORMAL HIGH (ref 0.0–40.0)

## 2013-08-07 LAB — PSA, MEDICARE: PSA: 0.53 ng/ml (ref 0.10–4.00)

## 2013-08-07 LAB — LDL CHOLESTEROL, DIRECT: Direct LDL: 86.2 mg/dL

## 2013-08-07 MED ORDER — CARVEDILOL 12.5 MG PO TABS: 12.5000 mg | ORAL_TABLET | Freq: Two times a day (BID) | ORAL | Status: DC

## 2013-08-07 MED ORDER — HYDRALAZINE HCL 25 MG PO TABS: 25.0000 mg | ORAL_TABLET | Freq: Three times a day (TID) | ORAL | Status: DC

## 2013-08-07 NOTE — Patient Instructions (Signed)
Increase Carvedilol to 12.5mg  twice daily. Continue Amlodipine 10mg  daily.  If blood pressure >160/100 then take Hydralazine 25mg  and repeat blood pressure in 1 hour.  Follow up in 1 week.

## 2013-08-07 NOTE — Progress Notes (Signed)
Subjective:    Patient ID: Jesus Peterson, male    DOB: 1946-02-03, 67 y.o.   MRN: [id number]  HPI 67 year old male with history of hypertension presents for acute visit. He was recently seen by his dentist for dental abscess and extraction of a broken tooth but was noted to have blood pressure greater than 180/100. He reports feeling significant pain at the site of the abscess and anxiety about the procedure. At home, however, his blood pressure has also been elevated typically greater than 170/100. He denies chest pain, headache. He has been compliant with medications. Prior to this event blood pressure was well-controlled.  Outpatient Encounter Prescriptions as of 08/07/2013  Medication Sig Dispense Refill  . amLODipine (NORVASC) 10 MG tablet Take 1 tablet (10 mg total) by mouth daily.  90 tablet  0  . aspirin 81 MG tablet Take 81 mg by mouth daily.      . carvedilol (COREG) 12.5 MG tablet Take 1 tablet (12.5 mg total) by mouth 2 (two) times daily with a meal.  180 tablet  4  . hydrocortisone 2.5 % cream Apply 1 application topically 2 (two) times daily.      Valerie Salts Polysacch (ALCORTIN A) 1-2-1 % GEL Apply 1 application topically daily.      Marland Kitchen ketoconazole (NIZORAL) 2 % cream Apply 1 application topically 2 (two) times daily.      . simvastatin (ZOCOR) 40 MG tablet Take 0.5 tablets (20 mg total) by mouth every evening.  90 tablet  0  . hydrALAZINE (APRESOLINE) 25 MG tablet Take 1 tablet (25 mg total) by mouth 3 (three) times daily.  90 tablet  1   No facility-administered encounter medications on file as of 08/07/2013.   BP 150/90  Pulse 73  Temp(Src) 98.4 F (36.9 C) (Oral)  Ht 5\' 10"  (1.778 m)  Wt 249 lb 8 oz (113.172 kg)  BMI 35.8 kg/m2  SpO2 96%  Review of Systems  Constitutional: Negative for fever, chills, activity change, appetite change, fatigue and unexpected weight change.  HENT: Positive for dental problem.   Eyes: Negative for visual disturbance.   Respiratory: Negative for cough and shortness of breath.   Cardiovascular: Negative for chest pain, palpitations and leg swelling.  Gastrointestinal: Negative for abdominal pain and abdominal distention.  Genitourinary: Negative for dysuria, urgency and difficulty urinating.  Musculoskeletal: Negative for arthralgias and gait problem.  Skin: Negative for color change and rash.  Hematological: Negative for adenopathy.  Psychiatric/Behavioral: Negative for sleep disturbance and dysphoric mood. The patient is nervous/anxious.        Objective:   Physical Exam  Constitutional: He is oriented to person, place, and time. He appears well-developed and well-nourished. No distress.  HENT:  Head: Normocephalic and atraumatic.  Right Ear: External ear normal.  Left Ear: External ear normal.  Nose: Nose normal.  Mouth/Throat: Oropharynx is clear and moist. No oropharyngeal exudate.  Eyes: Conjunctivae and EOM are normal. Pupils are equal, round, and reactive to light. Right eye exhibits no discharge. Left eye exhibits no discharge. No scleral icterus.  Neck: Normal range of motion. Neck supple. No tracheal deviation present. No thyromegaly present.  Cardiovascular: Normal rate, regular rhythm and normal heart sounds.  Exam reveals no gallop and no friction rub.   No murmur heard. Pulmonary/Chest: Effort normal and breath sounds normal. No respiratory distress. He has no wheezes. He has no rales. He exhibits no tenderness.  Musculoskeletal: Normal range of motion. He exhibits no edema.  Lymphadenopathy:  He has no cervical adenopathy.  Neurological: He is alert and oriented to person, place, and time. No cranial nerve deficit. Coordination normal.  Skin: Skin is warm and dry. No rash noted. He is not diaphoretic. No erythema. No pallor.  Psychiatric: He has a normal mood and affect. His behavior is normal. Judgment and thought content normal.          Assessment & Plan:

## 2013-08-07 NOTE — Assessment & Plan Note (Signed)
On antibiotics with erythromycin. Will recheck BP early next week and plan for follow up dental care later in the week for tooth extraction.

## 2013-08-07 NOTE — Assessment & Plan Note (Signed)
BP Readings from Last 3 Encounters:  08/07/13 150/90  12/11/12 120/82  11/08/12 140/90   BP elevated recently, likely exacerbated by pain from dental abscess and anxiety about dental procedure. Will increase Carvedilol to 12.5mg  bid. Will also add hydralazine 25mg  po tid prn for BP >160/100. Follow up early next week. Check renal function with labs today.

## 2013-08-07 NOTE — Assessment & Plan Note (Signed)
Elevated BG noted on labs, BG 189. Will add A1c to labs.

## 2013-08-08 ENCOUNTER — Ambulatory Visit: Payer: Medicare Other

## 2013-08-08 ENCOUNTER — Encounter: Payer: Self-pay | Admitting: Internal Medicine

## 2013-08-08 DIAGNOSIS — R7309 Other abnormal glucose: Secondary | ICD-10-CM

## 2013-08-08 LAB — HEMOGLOBIN A1C: Hgb A1c MFr Bld: 6.7 % — ABNORMAL HIGH (ref 4.6–6.5)

## 2013-08-09 MED ORDER — LOSARTAN POTASSIUM 25 MG PO TABS: 25.0000 mg | ORAL_TABLET | Freq: Every day | ORAL | Status: DC

## 2013-08-10 ENCOUNTER — Encounter: Payer: Self-pay | Admitting: Internal Medicine

## 2013-08-11 ENCOUNTER — Encounter: Payer: Self-pay | Admitting: Internal Medicine

## 2013-08-11 ENCOUNTER — Ambulatory Visit (INDEPENDENT_AMBULATORY_CARE_PROVIDER_SITE_OTHER): Payer: Managed Care, Other (non HMO) | Admitting: Internal Medicine

## 2013-08-11 VITALS — BP 120/80 | HR 67 | Temp 98.3°F | Ht 70.0 in | Wt 251.8 lb

## 2013-08-11 DIAGNOSIS — I10 Essential (primary) hypertension: Secondary | ICD-10-CM

## 2013-08-11 DIAGNOSIS — E119 Type 2 diabetes mellitus without complications: Secondary | ICD-10-CM

## 2013-08-11 NOTE — Progress Notes (Signed)
Subjective:    Patient ID: Jesus Peterson, male    DOB: [birth year], 67 y.o.   MRN: [id number]  HPI 67YO male with HTN, obesity presents for follow up.  HTN - BP continued to be elevated over the weekend, 150s/90s. Pt contacted me by email and we started Losartan. He notes improvement in BP on this med. No side effects noted. No headache, Palpiations, chest pain.  Labs also recently showed elevated BG 189 and A1c 6.7%. He has never been diagnosed with DM. He is trying to increase physical activity with use of stationary bike several days per week.  Outpatient Encounter Prescriptions as of 08/11/2013  Medication Sig Dispense Refill  . amLODipine (NORVASC) 10 MG tablet Take 1 tablet (10 mg total) by mouth daily.  90 tablet  0  . aspirin 81 MG tablet Take 81 mg by mouth daily.      . carvedilol (COREG) 12.5 MG tablet Take 1 tablet (12.5 mg total) by mouth 2 (two) times daily with a meal.  180 tablet  4  . hydrocortisone 2.5 % cream Apply 1 application topically 2 (two) times daily.      Valerie Salts Polysacch (ALCORTIN A) 1-2-1 % GEL Apply 1 application topically daily.      Marland Kitchen ketoconazole (NIZORAL) 2 % cream Apply 1 application topically 2 (two) times daily.      Marland Kitchen losartan (COZAAR) 25 MG tablet Take 1 tablet (25 mg total) by mouth daily.  90 tablet  3  . simvastatin (ZOCOR) 40 MG tablet Take 0.5 tablets (20 mg total) by mouth every evening.  90 tablet  0  . [DISCONTINUED] hydrALAZINE (APRESOLINE) 25 MG tablet Take 1 tablet (25 mg total) by mouth 3 (three) times daily.  90 tablet  1   No facility-administered encounter medications on file as of 08/11/2013.   BP 120/80  Pulse 67  Temp(Src) 98.3 F (36.8 C) (Oral)  Ht 5\' 10"  (1.778 m)  Wt 251 lb 12 oz (114.193 kg)  BMI 36.12 kg/m2  SpO2 96%  Review of Systems  Constitutional: Negative for fever, chills, activity change, appetite change, fatigue and unexpected weight change.  HENT: Positive for postnasal drip and rhinorrhea.    Eyes: Negative for visual disturbance.  Respiratory: Negative for cough and shortness of breath.   Cardiovascular: Negative for chest pain, palpitations and leg swelling.  Gastrointestinal: Negative for abdominal pain and abdominal distention.  Genitourinary: Negative for dysuria, urgency and difficulty urinating.  Musculoskeletal: Negative for arthralgias and gait problem.  Skin: Negative for color change and rash.  Hematological: Negative for adenopathy.  Psychiatric/Behavioral: Negative for sleep disturbance and dysphoric mood. The patient is not nervous/anxious.        Objective:   Physical Exam  Constitutional: He is oriented to person, place, and time. He appears well-developed and well-nourished. No distress.  HENT:  Head: Normocephalic and atraumatic.  Right Ear: External ear normal.  Left Ear: External ear normal.  Nose: Nose normal.  Mouth/Throat: Oropharynx is clear and moist. No oropharyngeal exudate.  Eyes: Conjunctivae and EOM are normal. Pupils are equal, round, and reactive to light. Right eye exhibits no discharge. Left eye exhibits no discharge. No scleral icterus.  Neck: Normal range of motion. Neck supple. No tracheal deviation present. No thyromegaly present.  Cardiovascular: Normal rate, regular rhythm and normal heart sounds.  Exam reveals no gallop and no friction rub.   No murmur heard. Pulmonary/Chest: Effort normal and breath sounds normal. No respiratory distress. He has no wheezes.  He has no rales. He exhibits no tenderness.  Musculoskeletal: Normal range of motion. He exhibits no edema.  Lymphadenopathy:    He has no cervical adenopathy.  Neurological: He is alert and oriented to person, place, and time. No cranial nerve deficit. Coordination normal.  Skin: Skin is warm and dry. No rash noted. He is not diaphoretic. No erythema. No pallor.  Psychiatric: He has a normal mood and affect. His behavior is normal. Judgment and thought content normal.           Assessment & Plan:

## 2013-08-11 NOTE — Patient Instructions (Signed)
Fasting blood sugar goal 80-120.  Follow up 1 week for blood work and 3 months for recheck.

## 2013-08-11 NOTE — Assessment & Plan Note (Signed)
Lab Results  Component Value Date   HGBA1C 6.7* 08/08/2013   Blood sugars more elevated on recent labs. Encouraged healthy, low glycemic index diet. Encouraged exercise with goal of per week. Will set up diabetes education. Pt was given a glucometer today. Will have him check BG fasting periodically at home and email with readings. Follow up for repeat A1c in 3 months. If no improvement, we discussed adding Metformin.

## 2013-08-11 NOTE — Assessment & Plan Note (Signed)
BP Readings from Last 3 Encounters:  08/11/13 120/80  08/07/13 150/90  12/11/12 120/82   BP well controlled with addition of Losartan. Will check Cr and K with labs end of this week. Continue to monitor BP at home. Follow up in 3 months and prn.

## 2013-08-15 ENCOUNTER — Encounter: Payer: Self-pay | Admitting: Internal Medicine

## 2013-08-18 ENCOUNTER — Other Ambulatory Visit (INDEPENDENT_AMBULATORY_CARE_PROVIDER_SITE_OTHER): Payer: Managed Care, Other (non HMO)

## 2013-08-18 ENCOUNTER — Telehealth: Payer: Self-pay | Admitting: *Deleted

## 2013-08-18 DIAGNOSIS — E119 Type 2 diabetes mellitus without complications: Secondary | ICD-10-CM

## 2013-08-18 LAB — BASIC METABOLIC PANEL
BUN: 15 mg/dL (ref 6–23)
CO2: 28 mEq/L (ref 19–32)
Calcium: 9 mg/dL (ref 8.4–10.5)
Chloride: 107 mEq/L (ref 96–112)
Creatinine, Ser: 0.9 mg/dL (ref 0.4–1.5)
GFR: 86.86 mL/min (ref >60.00)
Glucose, Bld: 134 mg/dL — ABNORMAL HIGH (ref 70–99)
Potassium: 4.1 mEq/L (ref 3.5–5.1)
Sodium: 140 mEq/L (ref 135–145)

## 2013-08-18 NOTE — Telephone Encounter (Signed)
Spoke with patient he state he is having a tooth pulled and thought anytime you have surgery then this should be a concern. The dentist did not order this or tell him to have this done, this is something he would like done. 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 Explained to him this is not something that is usually done, usually this test is done on people taking anticoagulants.

## 2013-08-18 NOTE — Telephone Encounter (Signed)
Please read Dr. Tilman Neat note below

## 2013-08-18 NOTE — Telephone Encounter (Signed)
Why does he need a PT/INR? This is for monitoring coumadin

## 2013-08-18 NOTE — Telephone Encounter (Signed)
Pt is going to the dentist to get a tooth pulled this Thursday and would like a pt/inr done, i collect the tube for it if you want to add that order?

## 2013-08-18 NOTE — Telephone Encounter (Signed)
I don't think there is any reason to check a PT/INR, as he is not on coumadin.

## 2013-08-22 ENCOUNTER — Other Ambulatory Visit: Payer: Managed Care, Other (non HMO)

## 2013-08-22 MED ORDER — SIMVASTATIN 40 MG PO TABS: 20.0000 mg | ORAL_TABLET | Freq: Every evening | ORAL | Status: DC

## 2013-08-22 NOTE — Telephone Encounter (Signed)
Pt is needing refill on Simvastatin and he uses [facility].

## 2013-08-22 NOTE — Telephone Encounter (Signed)
Prescription sent to the pharmacy.

## 2013-08-29 ENCOUNTER — Ambulatory Visit (INDEPENDENT_AMBULATORY_CARE_PROVIDER_SITE_OTHER): Payer: Managed Care, Other (non HMO) | Admitting: Internal Medicine

## 2013-08-29 ENCOUNTER — Encounter: Payer: Self-pay | Admitting: Internal Medicine

## 2013-08-29 VITALS — BP 128/86 | HR 62 | Temp 97.5°F | Resp 12 | Ht 70.25 in | Wt 245.0 lb

## 2013-08-29 DIAGNOSIS — I10 Essential (primary) hypertension: Secondary | ICD-10-CM

## 2013-08-29 DIAGNOSIS — F411 Generalized anxiety disorder: Secondary | ICD-10-CM

## 2013-08-29 MED ORDER — ALPRAZOLAM 0.5 MG PO TABS: ORAL_TABLET | ORAL | Status: DC

## 2013-08-29 NOTE — Progress Notes (Signed)
Subjective:    Patient ID: Jesus Peterson, male    DOB: December 03, 1945, 67 y.o.   MRN: [id number]  HPI 67 year old male with history of diabetes, hypertension presents for followup. Over the last several months he has been struggling with a dental abscess. He has had multiple visits to his dentist and has been scheduled for resection of infected tooth. However, when he visits the dentist his blood pressure is typically 150/100. At home it has been better controlled. He denies any chest pain, palpitations, headache. He is compliant with his medication.  Outpatient Encounter Prescriptions as of 08/29/2013  Medication Sig  . amLODipine (NORVASC) 10 MG tablet Take 1 tablet (10 mg total) by mouth daily.  Marland Kitchen aspirin 81 MG tablet Take 81 mg by mouth daily.  . carvedilol (COREG) 12.5 MG tablet Take 1 tablet (12.5 mg total) by mouth 2 (two) times daily with a meal.  . hydrocortisone 2.5 % cream Apply 1 application topically 2 (two) times daily.  Valerie Salts Polysacch (ALCORTIN A) 1-2-1 % GEL Apply 1 application topically daily.  Marland Kitchen ketoconazole (NIZORAL) 2 % cream Apply 1 application topically 2 (two) times daily.  Marland Kitchen losartan (COZAAR) 25 MG tablet Take 1 tablet (25 mg total) by mouth daily.  . simvastatin (ZOCOR) 40 MG tablet Take 0.5 tablets (20 mg total) by mouth every evening.   BP 128/86  Pulse 62  Temp(Src) 97.5 F (36.4 C) (Oral)  Resp 12  Ht 5' 10.25" (1.784 m)  Wt 245 lb (111.131 kg)  BMI 34.92 kg/m2  SpO2 98%  Review of Systems  Constitutional: Negative for fever, chills, activity change, appetite change, fatigue and unexpected weight change.  Eyes: Negative for visual disturbance.  Respiratory: Negative for cough and shortness of breath.   Cardiovascular: Negative for chest pain, palpitations and leg swelling.  Gastrointestinal: Negative for abdominal pain and abdominal distention.  Genitourinary: Negative for dysuria, urgency and difficulty urinating.  Musculoskeletal:  Negative for arthralgias and gait problem.  Skin: Negative for color change and rash.  Hematological: Negative for adenopathy.  Psychiatric/Behavioral: Negative for sleep disturbance and dysphoric mood. The patient is nervous/anxious.        Objective:   Physical Exam  Constitutional: He is oriented to person, place, and time. He appears well-developed and well-nourished. No distress.  HENT:  Head: Normocephalic and atraumatic.  Right Ear: External ear normal.  Left Ear: External ear normal.  Nose: Nose normal.  Mouth/Throat: Oropharynx is clear and moist. No oropharyngeal exudate.  Eyes: Conjunctivae and EOM are normal. Pupils are equal, round, and reactive to light. Right eye exhibits no discharge. Left eye exhibits no discharge. No scleral icterus.  Neck: Normal range of motion. Neck supple. No tracheal deviation present. No thyromegaly present.  Cardiovascular: Normal rate, regular rhythm and normal heart sounds.  Exam reveals no gallop and no friction rub.   No murmur heard. Pulmonary/Chest: Effort normal and breath sounds normal. No respiratory distress. He has no wheezes. He has no rales. He exhibits no tenderness.  Musculoskeletal: Normal range of motion. He exhibits no edema.  Lymphadenopathy:    He has no cervical adenopathy.  Neurological: He is alert and oriented to person, place, and time. No cranial nerve deficit. Coordination normal.  Skin: Skin is warm and dry. No rash noted. He is not diaphoretic. No erythema. No pallor.  Psychiatric: He has a normal mood and affect. His behavior is normal. Judgment and thought content normal.          Assessment &  Plan:

## 2013-08-29 NOTE — Assessment & Plan Note (Signed)
Intermittent elevated blood pressure noted at dentist office. Suspect that anxiety is playing a role. Will have patient take alprazolam 0.5 mg prior to upcoming dental procedure.

## 2013-08-29 NOTE — Patient Instructions (Signed)

## 2013-08-29 NOTE — Assessment & Plan Note (Signed)
BP Readings from Last 3 Encounters:  08/29/13 128/86  08/11/13 120/80  08/07/13 150/90   In general, blood pressures have been better controlled. However, he continues to have some elevated blood sugar particularly with increased anxiety at the dentist office. Will add alprazolam to better help with symptoms of anxiety. We'll continue current blood pressure medication.

## 2013-08-29 NOTE — Progress Notes (Signed)
Pre visit review using our clinic review tool, if applicable. No additional management support is needed unless otherwise documented below in the visit note. 

## 2013-09-03 ENCOUNTER — Encounter: Payer: Self-pay | Admitting: Emergency Medicine

## 2013-09-04 ENCOUNTER — Encounter: Payer: Self-pay | Admitting: Internal Medicine

## 2013-09-08 LAB — HM DIABETES EYE EXAM

## 2013-09-13 ENCOUNTER — Encounter: Payer: Self-pay | Admitting: Internal Medicine

## 2013-09-22 ENCOUNTER — Encounter: Payer: Self-pay | Admitting: Internal Medicine

## 2013-09-24 ENCOUNTER — Telehealth: Payer: Self-pay | Admitting: *Deleted

## 2013-09-24 DIAGNOSIS — E119 Type 2 diabetes mellitus without complications: Secondary | ICD-10-CM

## 2013-09-24 MED ORDER — BAYER MICROLET LANCETS MISC: Status: DC

## 2013-09-24 MED ORDER — GLUCOSE BLOOD VI STRP: ORAL_STRIP | Status: DC

## 2013-09-24 NOTE — Telephone Encounter (Signed)
Refill Request  Contour Next test strips   Microlet Lancets

## 2013-09-24 NOTE — Telephone Encounter (Signed)
Prescription sent to the pharmacy.

## 2013-10-10 ENCOUNTER — Other Ambulatory Visit: Payer: Self-pay | Admitting: *Deleted

## 2013-10-10 DIAGNOSIS — E119 Type 2 diabetes mellitus without complications: Secondary | ICD-10-CM

## 2013-10-10 MED ORDER — GLUCOSE BLOOD VI STRP: ORAL_STRIP | Status: DC

## 2013-10-10 MED ORDER — BAYER MICROLET LANCETS MISC: Status: DC

## 2013-10-27 ENCOUNTER — Other Ambulatory Visit: Payer: Self-pay | Admitting: *Deleted

## 2013-10-27 DIAGNOSIS — I10 Essential (primary) hypertension: Secondary | ICD-10-CM

## 2013-10-27 MED ORDER — CARVEDILOL 12.5 MG PO TABS: 12.5000 mg | ORAL_TABLET | Freq: Two times a day (BID) | ORAL | Status: DC

## 2013-10-27 MED ORDER — AMLODIPINE BESYLATE 10 MG PO TABS: 10.0000 mg | ORAL_TABLET | Freq: Every day | ORAL | Status: DC

## 2013-11-07 ENCOUNTER — Encounter: Payer: Self-pay | Admitting: Internal Medicine

## 2013-11-07 ENCOUNTER — Ambulatory Visit (INDEPENDENT_AMBULATORY_CARE_PROVIDER_SITE_OTHER): Payer: Managed Care, Other (non HMO) | Admitting: Internal Medicine

## 2013-11-07 VITALS — BP 120/80 | HR 62 | Temp 97.5°F | Ht 70.25 in | Wt 250.5 lb

## 2013-11-07 DIAGNOSIS — E785 Hyperlipidemia, unspecified: Secondary | ICD-10-CM

## 2013-11-07 DIAGNOSIS — Z23 Encounter for immunization: Secondary | ICD-10-CM

## 2013-11-07 DIAGNOSIS — E119 Type 2 diabetes mellitus without complications: Secondary | ICD-10-CM

## 2013-11-07 DIAGNOSIS — I10 Essential (primary) hypertension: Secondary | ICD-10-CM

## 2013-11-07 LAB — COMPREHENSIVE METABOLIC PANEL
ALT: 32 U/L (ref 0–53)
AST: 25 U/L (ref 0–37)
Albumin: 4.2 g/dL (ref 3.5–5.2)
Alkaline Phosphatase: 82 U/L (ref 39–117)
BUN: 17 mg/dL (ref 6–23)
CO2: 27 mEq/L (ref 19–32)
Calcium: 9 mg/dL (ref 8.4–10.5)
Chloride: 106 mEq/L (ref 96–112)
Creatinine, Ser: 1 mg/dL (ref 0.4–1.5)
GFR: 77.06 mL/min (ref >60.00)
Glucose, Bld: 134 mg/dL — ABNORMAL HIGH (ref 70–99)
Potassium: 4.2 mEq/L (ref 3.5–5.1)
Sodium: 140 mEq/L (ref 135–145)
Total Bilirubin: 0.9 mg/dL (ref 0.3–1.2)
Total Protein: 7.3 g/dL (ref 6.0–8.3)

## 2013-11-07 LAB — HEMOGLOBIN A1C: Hgb A1c MFr Bld: 6.4 % (ref 4.6–6.5)

## 2013-11-07 NOTE — Progress Notes (Signed)
Pre-visit discussion using our clinic review tool. No additional management support is needed unless otherwise documented below in the visit note.  

## 2013-11-08 NOTE — Assessment & Plan Note (Signed)
Lipids well controlled on simvastatin. LFTs normal today.

## 2013-11-08 NOTE — Progress Notes (Signed)
Subjective:    Patient ID: Jesus Peterson, male    DOB: 1945/12/14, 68 y.o.   MRN: [id number]  HPI 68 year old male with history of diet-controlled diabetes, hypertension, hyperlipidemia, obesity presents for followup. He reports that he is generally feeling well. Blood sugars have been well-controlled with no sugars near 120 fasting. He is compliant with medication. He denies any new concerns today.  Outpatient Encounter Prescriptions as of 11/07/2013  Medication Sig  . amLODipine (NORVASC) 10 MG tablet Take 1 tablet (10 mg total) by mouth daily.  Marland Kitchen aspirin 81 MG tablet Take 81 mg by mouth daily.  Marland Kitchen BAYER MICROLET LANCETS lancets Use at home to test blood sugars twice a day  . carvedilol (COREG) 12.5 MG tablet Take 1 tablet (12.5 mg total) by mouth 2 (two) times daily with a meal.  . glucose blood (BAYER CONTOUR NEXT TEST) test strip Use as home to test blood sugars twice a day  . hydrocortisone 2.5 % cream Apply 1 application topically 2 (two) times daily.  Jerrye Beavers Polysacch (ALCORTIN A) 1-2-1 % GEL Apply 1 application topically daily.  Marland Kitchen ketoconazole (NIZORAL) 2 % cream Apply 1 application topically 2 (two) times daily.  . simvastatin (ZOCOR) 40 MG tablet Take 0.5 tablets (20 mg total) by mouth every evening.  Marland Kitchen losartan (COZAAR) 25 MG tablet Take 1 tablet (25 mg total) by mouth daily.  . [DISCONTINUED] ALPRAZolam (XANAX) 0.5 MG tablet Take 1 tablet 30-94minutes prior to procedure, then you may repeat x 1 at time of procedure.   BP 120/80  Pulse 62  Temp(Src) 97.5 F (36.4 C) (Oral)  Ht 5' 10.25" (1.784 m)  Wt 250 lb 8 oz (113.626 kg)  BMI 35.70 kg/m2  SpO2 96%  Review of Systems  Constitutional: Negative for fever, chills, activity change, appetite change, fatigue and unexpected weight change.  Eyes: Negative for visual disturbance.  Respiratory: Negative for cough and shortness of breath.   Cardiovascular: Negative for chest pain, palpitations and leg swelling.    Gastrointestinal: Negative for abdominal pain and abdominal distention.  Genitourinary: Negative for dysuria, urgency and difficulty urinating.  Musculoskeletal: Negative for arthralgias and gait problem.  Skin: Negative for color change and rash.  Hematological: Negative for adenopathy.  Psychiatric/Behavioral: Negative for sleep disturbance and dysphoric mood. The patient is not nervous/anxious.        Objective:   Physical Exam  Constitutional: He is oriented to person, place, and time. He appears well-developed and well-nourished. No distress.  HENT:  Head: Normocephalic and atraumatic.  Right Ear: External ear normal.  Left Ear: External ear normal.  Nose: Nose normal.  Mouth/Throat: Oropharynx is clear and moist. No oropharyngeal exudate.  Eyes: Conjunctivae and EOM are normal. Pupils are equal, round, and reactive to light. Right eye exhibits no discharge. Left eye exhibits no discharge. No scleral icterus.  Neck: Normal range of motion. Neck supple. No tracheal deviation present. No thyromegaly present.  Cardiovascular: Normal rate, regular rhythm and normal heart sounds.  Exam reveals no gallop and no friction rub.   No murmur heard. Pulmonary/Chest: Effort normal and breath sounds normal. No respiratory distress. He has no wheezes. He has no rales. He exhibits no tenderness.  Musculoskeletal: Normal range of motion. He exhibits no edema.  Lymphadenopathy:    He has no cervical adenopathy.  Neurological: He is alert and oriented to person, place, and time. No cranial nerve deficit. Coordination normal.  Skin: Skin is warm and dry. No rash noted. He is  not diaphoretic. No erythema. No pallor.  Psychiatric: He has a normal mood and affect. His behavior is normal. Judgment and thought content normal.          Assessment & Plan:

## 2013-11-08 NOTE — Assessment & Plan Note (Signed)
Lab Results  Component Value Date   HGBA1C 6.4 11/07/2013   BG well controlled with diet alone. Will continue current medications.

## 2013-11-08 NOTE — Assessment & Plan Note (Signed)
BP Readings from Last 3 Encounters:  11/07/13 120/80  08/29/13 128/86  08/11/13 120/80   BP well controlled on current medications. Will continue. Will check renal function with labs today.

## 2013-11-12 ENCOUNTER — Telehealth: Payer: Self-pay

## 2013-11-12 NOTE — Telephone Encounter (Signed)
Relevant patient education assigned to patient using Emmi. ° °

## 2013-11-19 ENCOUNTER — Telehealth: Payer: Self-pay | Admitting: Internal Medicine

## 2013-11-19 NOTE — Telephone Encounter (Signed)
Relevant patient education assigned to patient using Emmi. ° °

## 2014-01-09 ENCOUNTER — Ambulatory Visit (INDEPENDENT_AMBULATORY_CARE_PROVIDER_SITE_OTHER): Payer: Managed Care, Other (non HMO) | Admitting: Internal Medicine

## 2014-01-09 ENCOUNTER — Encounter: Payer: Self-pay | Admitting: Internal Medicine

## 2014-01-09 ENCOUNTER — Telehealth: Payer: Self-pay | Admitting: Internal Medicine

## 2014-01-09 VITALS — BP 160/100 | HR 73 | Temp 97.9°F | Wt 253.0 lb

## 2014-01-09 DIAGNOSIS — I10 Essential (primary) hypertension: Secondary | ICD-10-CM

## 2014-01-09 DIAGNOSIS — M25511 Pain in right shoulder: Secondary | ICD-10-CM | POA: Insufficient documentation

## 2014-01-09 DIAGNOSIS — R6889 Other general symptoms and signs: Secondary | ICD-10-CM

## 2014-01-09 DIAGNOSIS — M25519 Pain in unspecified shoulder: Secondary | ICD-10-CM

## 2014-01-09 MED ORDER — LOSARTAN POTASSIUM 50 MG PO TABS: 50.0000 mg | ORAL_TABLET | Freq: Every day | ORAL | Status: DC

## 2014-01-09 NOTE — Assessment & Plan Note (Signed)
Right shoulder pain dull for several weeks, acutely worsened today. Pain with abduction suggestive of rotator cuff tear or strain. Unable to take NSAIDS because of HTN. Will continue Tylenol and prn Hydocodone for severe pain. Will set up MRI shoulderfor further evaluation.

## 2014-01-09 NOTE — Assessment & Plan Note (Signed)
Recent temperature intolerance. Will check TSH with labs next week.

## 2014-01-09 NOTE — Telephone Encounter (Signed)
Patient was seen by Dr. Walker today

## 2014-01-09 NOTE — Telephone Encounter (Signed)
Pt called office directly after leaving appt.  States he forgot to get note from Dr. Walking prohibiting heavy lifting.  Pt coming back for note.

## 2014-01-09 NOTE — Patient Instructions (Signed)
Start Losartan 50mg  daily.  We will set up MRI right shoulder.

## 2014-01-09 NOTE — Telephone Encounter (Signed)
Fwd to Dr. Walker 

## 2014-01-09 NOTE — Progress Notes (Signed)
Subjective:    Patient ID: Jesus Peterson, male    DOB: Jan 08, 1946, 68 y.o.   MRN: [id number]  HPI 68YO male presents for acute visit.  Right shoulder pain - sharp,severe pain this morning when reaching back to get a kleenex this morning. Has had some dull pain intermittently, last few weeks at work. No meds for pain. No weakness or numbness. No trauma. Not taking anything for pain.  HTN - Has not recently been taking Losartan. Does not check BP at home.  No recent headache, chest pain.   His wife is concerned that he seems cold all of the time. Uses several blankets while in the home which is heated. Questions hormone imbalance.  Review of Systems  Constitutional: Negative for fever, chills, activity change, appetite change, fatigue and unexpected weight change.  Eyes: Negative for visual disturbance.  Respiratory: Negative for cough and shortness of breath.   Cardiovascular: Negative for chest pain, palpitations and leg swelling.  Gastrointestinal: Negative for abdominal pain and abdominal distention.  Genitourinary: Negative for dysuria, urgency and difficulty urinating.  Musculoskeletal: Positive for arthralgias (right shoulder pain) and myalgias. Negative for gait problem.  Skin: Negative for color change and rash.  Neurological: Negative for weakness and numbness.  Hematological: Negative for adenopathy.  Psychiatric/Behavioral: Negative for sleep disturbance and dysphoric mood. The patient is not nervous/anxious.        Objective:    BP 160/100  Pulse 73  Temp(Src) 97.9 F (36.6 C) (Oral)  Wt 253 lb (114.76 kg)  SpO2 97% Physical Exam  Constitutional: He is oriented to person, place, and time. He appears well-developed and well-nourished. No distress.  HENT:  Head: Normocephalic and atraumatic.  Right Ear: External ear normal.  Left Ear: External ear normal.  Nose: Nose normal.  Mouth/Throat: Oropharynx is clear and moist. No oropharyngeal exudate.  Eyes:  Conjunctivae and EOM are normal. Pupils are equal, round, and reactive to light. Right eye exhibits no discharge. Left eye exhibits no discharge. No scleral icterus.  Neck: Normal range of motion. Neck supple. No tracheal deviation present. No thyromegaly present.  Cardiovascular: Normal rate, regular rhythm and normal heart sounds.  Exam reveals no gallop and no friction rub.   No murmur heard. Pulmonary/Chest: Effort normal and breath sounds normal. No accessory muscle usage. Not tachypneic. No respiratory distress. He has no decreased breath sounds. He has no wheezes. He has no rhonchi. He has no rales. He exhibits no tenderness.  Musculoskeletal: Normal range of motion. He exhibits no edema.       Right shoulder: He exhibits tenderness and pain (with abduction and posterior rotation). He exhibits normal range of motion, no bony tenderness and no swelling.  Lymphadenopathy:    He has no cervical adenopathy.  Neurological: He is alert and oriented to person, place, and time. No cranial nerve deficit. Coordination normal.  Skin: Skin is warm and dry. No rash noted. He is not diaphoretic. No erythema. No pallor.  Psychiatric: He has a normal mood and affect. His behavior is normal. Judgment and thought content normal.          Assessment & Plan:   Problem List Items Addressed This Visit   Hypertension      BP Readings from Last 3 Encounters:  01/09/14 160/100  11/07/13 120/80  08/29/13 128/86   BP elevated today. Will restart Losartan 60m daily. Check Cr and K next week. Follow up in 3 weeks.    Relevant Medications  losartan (COZAAR) tablet   Other Relevant Orders      Comp Met (CMET)   Right shoulder pain - Primary     Right shoulder pain dull for several weeks, acutely worsened today. Pain with abduction suggestive of rotator cuff tear or strain. Unable to take NSAIDS because of HTN. Will continue Tylenol and prn Hydocodone for severe pain. Will set up MRI shoulderfor  further evaluation.    Relevant Orders      MR Shoulder Right Wo Contrast   Temperature intolerance     Recent temperature intolerance. Will check TSH with labs next week.    Relevant Orders      TSH       Return if symptoms worsen or fail to improve.

## 2014-01-09 NOTE — Telephone Encounter (Signed)
Pt called for appt with Dr. Gilford Rile.  States he moved wrong and has severe pain.  No appt available in office.  Transferred to triage

## 2014-01-09 NOTE — Progress Notes (Signed)
Pre visit review using our clinic review tool, if applicable. No additional management support is needed unless otherwise documented below in the visit note. 

## 2014-01-09 NOTE — Assessment & Plan Note (Signed)
BP Readings from Last 3 Encounters:  01/09/14 160/100  11/07/13 120/80  08/29/13 128/86   BP elevated today. Will restart Losartan 50mg  daily. Check Cr and K next week. Follow up in 3 weeks.

## 2014-01-12 NOTE — Telephone Encounter (Signed)
Patient was given note. 

## 2014-01-13 ENCOUNTER — Encounter: Payer: Self-pay | Admitting: Internal Medicine

## 2014-01-13 ENCOUNTER — Telehealth: Payer: Self-pay | Admitting: Internal Medicine

## 2014-01-13 NOTE — Telephone Encounter (Signed)
Pt came in to office asking for a new work note.  States he was given note previously stating no lifting for employment.  States his employer will now not even let him drive his work [employer] which has wheelchairs.  Pt states he has to be able to work, so is asking to for a new note stating that he may work, may drive, and may lift.  Pt states he will be back this afternoon to pick up note if possible.  States he needs to get back to work asap.

## 2014-01-13 NOTE — Telephone Encounter (Signed)
Fwd to Dr. Walker 

## 2014-01-13 NOTE — Telephone Encounter (Signed)
Patient aware note has been completed and upfront for pick up. Has not had MRI, still waiting on insurance and he will be careful lifting his arm.

## 2014-01-13 NOTE — Telephone Encounter (Signed)
Has he had evaluation of his shoulder with MRI yet? We can write a note saying he can return to work, however he should be cautious about lifting with his right arm.

## 2014-01-15 ENCOUNTER — Telehealth: Payer: Self-pay | Admitting: Internal Medicine

## 2014-01-15 ENCOUNTER — Other Ambulatory Visit: Payer: Managed Care, Other (non HMO)

## 2014-01-15 DIAGNOSIS — M25511 Pain in right shoulder: Secondary | ICD-10-CM

## 2014-01-15 NOTE — Telephone Encounter (Signed)
MRI shoulder denied by insurance company.

## 2014-01-21 NOTE — Addendum Note (Signed)
Addended by: Ronette Deter A on: 01/21/2014 07:50 PM   Modules accepted: Orders

## 2014-01-23 ENCOUNTER — Ambulatory Visit (INDEPENDENT_AMBULATORY_CARE_PROVIDER_SITE_OTHER)
Admission: RE | Admit: 2014-01-23 | Discharge: 2014-01-23 | Disposition: A | Discharge status: Home / Self Care | Payer: Managed Care, Other (non HMO) | Source: Ambulatory Visit | Attending: Internal Medicine | Admitting: Internal Medicine

## 2014-01-23 ENCOUNTER — Other Ambulatory Visit (INDEPENDENT_AMBULATORY_CARE_PROVIDER_SITE_OTHER): Payer: Managed Care, Other (non HMO)

## 2014-01-23 DIAGNOSIS — M25511 Pain in right shoulder: Secondary | ICD-10-CM

## 2014-01-23 DIAGNOSIS — I10 Essential (primary) hypertension: Secondary | ICD-10-CM

## 2014-01-23 DIAGNOSIS — M25519 Pain in unspecified shoulder: Secondary | ICD-10-CM

## 2014-01-23 DIAGNOSIS — R6889 Other general symptoms and signs: Secondary | ICD-10-CM

## 2014-01-23 LAB — COMPREHENSIVE METABOLIC PANEL
ALT: 31 U/L (ref 0–53)
AST: 22 U/L (ref 0–37)
Albumin: 4 g/dL (ref 3.5–5.2)
Alkaline Phosphatase: 88 U/L (ref 39–117)
BUN: 13 mg/dL (ref 6–23)
CO2: 28 mEq/L (ref 19–32)
Calcium: 9.4 mg/dL (ref 8.4–10.5)
Chloride: 104 mEq/L (ref 96–112)
Creatinine, Ser: 1 mg/dL (ref 0.4–1.5)
GFR: 78.79 mL/min (ref >60.00)
Glucose, Bld: 198 mg/dL — ABNORMAL HIGH (ref 70–99)
Potassium: 4.4 mEq/L (ref 3.5–5.1)
Sodium: 139 mEq/L (ref 135–145)
Total Bilirubin: 0.8 mg/dL (ref 0.3–1.2)
Total Protein: 6.7 g/dL (ref 6.0–8.3)

## 2014-01-23 LAB — TSH: TSH: 3.09 u[IU]/mL (ref 0.35–5.50)

## 2014-02-06 ENCOUNTER — Ambulatory Visit (INDEPENDENT_AMBULATORY_CARE_PROVIDER_SITE_OTHER): Payer: Managed Care, Other (non HMO) | Admitting: Internal Medicine

## 2014-02-06 ENCOUNTER — Encounter: Payer: Self-pay | Admitting: Internal Medicine

## 2014-02-06 VITALS — BP 140/80 | HR 62 | Temp 97.5°F | Ht 69.0 in | Wt 238.0 lb

## 2014-02-06 DIAGNOSIS — M25511 Pain in right shoulder: Secondary | ICD-10-CM

## 2014-02-06 DIAGNOSIS — E119 Type 2 diabetes mellitus without complications: Secondary | ICD-10-CM

## 2014-02-06 DIAGNOSIS — R9431 Abnormal electrocardiogram [ECG] [EKG]: Secondary | ICD-10-CM | POA: Insufficient documentation

## 2014-02-06 DIAGNOSIS — M25519 Pain in unspecified shoulder: Secondary | ICD-10-CM

## 2014-02-06 DIAGNOSIS — Z0000 Encounter for general adult medical examination without abnormal findings: Secondary | ICD-10-CM | POA: Insufficient documentation

## 2014-02-06 LAB — CBC WITH DIFFERENTIAL/PLATELET
Basophils Absolute: 0 10*3/uL (ref 0.0–0.1)
Basophils Relative: 0.3 % (ref 0.0–3.0)
Eosinophils Absolute: 0.1 10*3/uL (ref 0.0–0.7)
Eosinophils Relative: 1.9 % (ref 0.0–5.0)
HCT: 48.3 % (ref 39.0–52.0)
Hemoglobin: 16.1 g/dL (ref 13.0–17.0)
Lymphocytes Relative: 21.7 % (ref 12.0–46.0)
Lymphs Abs: 1.5 10*3/uL (ref 0.7–4.0)
MCHC: 33.5 g/dL (ref 30.0–36.0)
MCV: 88.2 fl (ref 78.0–100.0)
Monocytes Absolute: 0.6 10*3/uL (ref 0.1–1.0)
Monocytes Relative: 9.1 % (ref 3.0–12.0)
Neutro Abs: 4.7 10*3/uL (ref 1.4–7.7)
Neutrophils Relative %: 67 % (ref 43.0–77.0)
Platelets: 171 10*3/uL (ref 150.0–400.0)
RBC: 5.47 Mil/uL (ref 4.22–5.81)
RDW: 13.5 % (ref 11.5–14.6)
WBC: 7 10*3/uL (ref 4.5–10.5)

## 2014-02-06 LAB — HM DIABETES FOOT EXAM: HM Diabetic Foot Exam: NORMAL

## 2014-02-06 LAB — LIPID PANEL
Cholesterol: 136 mg/dL (ref 0–200)
HDL: 34.5 mg/dL — ABNORMAL LOW (ref >39.00)
LDL Cholesterol: 59 mg/dL (ref 0–99)
Total CHOL/HDL Ratio: 4
Triglycerides: 215 mg/dL — ABNORMAL HIGH (ref 0.0–149.0)
VLDL: 43 mg/dL — ABNORMAL HIGH (ref 0.0–40.0)

## 2014-02-06 LAB — COMPREHENSIVE METABOLIC PANEL
ALT: 36 U/L (ref 0–53)
AST: 29 U/L (ref 0–37)
Albumin: 4.3 g/dL (ref 3.5–5.2)
Alkaline Phosphatase: 84 U/L (ref 39–117)
BUN: 21 mg/dL (ref 6–23)
CO2: 23 mEq/L (ref 19–32)
Calcium: 9.8 mg/dL (ref 8.4–10.5)
Chloride: 106 mEq/L (ref 96–112)
Creatinine, Ser: 1 mg/dL (ref 0.4–1.5)
GFR: 82.58 mL/min (ref >60.00)
Glucose, Bld: 120 mg/dL — ABNORMAL HIGH (ref 70–99)
Potassium: 4.1 mEq/L (ref 3.5–5.1)
Sodium: 138 mEq/L (ref 135–145)
Total Bilirubin: 1 mg/dL (ref 0.3–1.2)
Total Protein: 7 g/dL (ref 6.0–8.3)

## 2014-02-06 LAB — PSA, MEDICARE: PSA: 0.56 ng/ml (ref 0.10–4.00)

## 2014-02-06 LAB — MICROALBUMIN / CREATININE URINE RATIO
Creatinine,U: 237.1 mg/dL
Microalb Creat Ratio: 0.6 mg/g (ref 0.0–30.0)
Microalb, Ur: 1.5 mg/dL (ref 0.0–1.9)

## 2014-02-06 LAB — HEMOGLOBIN A1C: Hgb A1c MFr Bld: 6.4 % (ref 4.6–6.5)

## 2014-02-06 MED ORDER — ALPRAZOLAM 0.5 MG PO TABS: ORAL_TABLET | ORAL | Status: DC

## 2014-02-06 NOTE — Assessment & Plan Note (Signed)
Symptoms improved with PT. Will continue.

## 2014-02-06 NOTE — Assessment & Plan Note (Signed)
General medical exam normal today. Encouraged healthy diet and exercise. Immunizations UTD. Will check labs including CBC, CMP, lipids, PSA, A1c. Appropriate screening performed. Colonoscopy UTD.

## 2014-02-06 NOTE — Assessment & Plan Note (Signed)
Non-specific EKG abnormalities noted today. Will request previous records on stress test from [facility].

## 2014-02-06 NOTE — Assessment & Plan Note (Signed)
Will check A1c today. Continue healthy diet.

## 2014-02-06 NOTE — Progress Notes (Signed)
Pre visit review using our clinic review tool, if applicable. No additional management support is needed unless otherwise documented below in the visit note. 

## 2014-02-06 NOTE — Progress Notes (Signed)
The patient is here for annual Medicare Wellness Examination and management of other chronic and acute problems.   The risk factors are reflected in the history.  The roster of all physicians providing medical care to patient - is listed in the Snapshot section of the chart.  Activities of daily living:   The patient is 100% independent in all ADLs: dressing, toileting, feeding as well as independent mobility. Patient lives with wife. No pets. Occasionally cat stays with them. Home is one story. No recent falls.  Home safety :  The patient has smoke detectors in the home.  They wear seatbelts in their car. There are no firearms at home.  There is no violence in the home. They feel safe where they live.  Infectious Risks: There is no risks for hepatitis, STDs or HIV.  There is no  history of blood transfusion.  They have no travel history to infectious disease endemic areas of the world.  Additional Health Care Providers: The patient has seen their dentist in the last six months. Dentist - [place] They have seen their eye doctor in the last year. Opthalmologist - [place], Dr. Sabra Heck They deny hearing issues. They have deferred audiologic testing in the last year.   They do not  have excessive sun exposure. Discussed the need for sun protection: hats,long sleeves and use of sunscreen if there is significant sun exposure.  Dermatologist - Dr. Lovell Sheehan  Diet: the importance of a healthy diet is discussed. They do have a healthy diet.  The benefits of regular aerobic exercise were discussed. Exercise has been limited recently because of right shoulder pain. Currently undergoing PT.  Depression screen: there are no signs or vegative symptoms of depression- irritability, change in appetite, anhedonia, sadness/tearfullness.  Cognitive assessment: the patient manages all their financial and personal affairs and is actively engaged. Manages own finances.  HCPOA - none in  place Living will in place.  The following portions of the patient's history were reviewed and updated as appropriate: allergies, current medications, past family history, past medical history,  past surgical history, past social history and problem list.  Visual acuity was not assessed per patient preference as they have regular follow up with their ophthalmologist. Hearing and body mass index were assessed and reviewed.   During the course of the visit the patient was educated and counseled about appropriate screening and preventive services including : fall prevention , diabetes screening, nutrition counseling, colorectal cancer screening, and recommended immunizations.    Review of Systems  Constitutional: Negative for fever, chills, activity change, appetite change, fatigue and unexpected weight change.  Eyes: Negative for visual disturbance.  Respiratory: Negative for cough and shortness of breath.   Cardiovascular: Negative for chest pain, palpitations and leg swelling.  Gastrointestinal: Negative for abdominal pain and abdominal distention.  Genitourinary: Negative for dysuria, urgency and difficulty urinating.  Musculoskeletal: Negative for arthralgias and gait problem.  Skin: Negative for color change and rash.  Hematological: Negative for adenopathy.  Psychiatric/Behavioral: Negative for sleep disturbance and dysphoric mood. The patient is not nervous/anxious.        Objective:    BP 140/80  Pulse 62  Temp(Src) 97.5 F (36.4 C) (Oral)  Ht 5\' 9"  (1.753 m)  Wt 238 lb (107.956 kg)  BMI 35.13 kg/m2  SpO2 95% Physical Exam  Constitutional: He is oriented to person, place, and time. He appears well-developed and well-nourished. No distress.  HENT:  Head: Normocephalic and atraumatic.  Right Ear: External ear normal.  Left Ear: External ear normal.  Nose: Nose normal.  Mouth/Throat: Oropharynx is clear and moist. No oropharyngeal exudate.  Eyes: Conjunctivae and EOM are  normal. Pupils are equal, round, and reactive to light. Right eye exhibits no discharge. Left eye exhibits no discharge. No scleral icterus.  Neck: Normal range of motion. Neck supple. No tracheal deviation present. No thyromegaly present.  Cardiovascular: Normal rate, regular rhythm and normal heart sounds.  Exam reveals no gallop and no friction rub.   No murmur heard. Pulmonary/Chest: Effort normal and breath sounds normal. No respiratory distress. He has no wheezes. He has no rales. He exhibits no tenderness.  Abdominal: Soft. Bowel sounds are normal. He exhibits no distension and no mass. There is no tenderness. There is no rebound and no guarding.  Musculoskeletal: Normal range of motion. He exhibits no edema.  Lymphadenopathy:    He has no cervical adenopathy.  Neurological: He is alert and oriented to person, place, and time. No cranial nerve deficit. Coordination normal.  Skin: Skin is warm and dry. No rash noted. He is not diaphoretic. No erythema. No pallor.  Psychiatric: He has a normal mood and affect. His behavior is normal. Judgment and thought content normal.          Assessment & Plan:   Problem List Items Addressed This Visit   Diabetes type 2, controlled     Will check A1c today. Continue healthy diet.    Relevant Orders      Comprehensive metabolic panel      Hemoglobin A1c   Medicare annual wellness visit, subsequent - Primary     General medical exam normal today. Encouraged healthy diet and exercise. Immunizations UTD. Will check labs including CBC, CMP, lipids, PSA, A1c. Appropriate screening performed. Colonoscopy UTD.    Relevant Orders      Lipid panel      Microalbumin / creatinine urine ratio      CBC with Differential      PSA, Medicare      EKG 12-Lead (Completed)   Nonspecific abnormal electrocardiogram (ECG) (EKG)     Non-specific EKG abnormalities noted today. Will request previous records on stress test from [facility].    Right shoulder  pain     Symptoms improved with PT. Will continue.        Return in about 3 months (around 05/08/2014) for Recheck of Diabetes.

## 2014-02-09 ENCOUNTER — Telehealth: Payer: Self-pay | Admitting: *Deleted

## 2014-02-09 NOTE — Telephone Encounter (Signed)
Crystal left a message stating they need a return to verify a prescription received from a patient. Called and spoke with Crystal, gave her the verbal ok to fill prescription for Alprazolam.

## 2014-04-09 ENCOUNTER — Ambulatory Visit: Payer: Managed Care, Other (non HMO) | Admitting: Internal Medicine

## 2014-05-11 ENCOUNTER — Other Ambulatory Visit: Payer: Self-pay | Admitting: *Deleted

## 2014-05-11 DIAGNOSIS — I38 Endocarditis, valve unspecified: Secondary | ICD-10-CM | POA: Insufficient documentation

## 2014-05-11 MED ORDER — AMLODIPINE BESYLATE 10 MG PO TABS: 10.0000 mg | ORAL_TABLET | Freq: Every day | ORAL | Status: DC

## 2014-05-12 ENCOUNTER — Telehealth: Payer: Self-pay | Admitting: Internal Medicine

## 2014-05-12 NOTE — Telephone Encounter (Signed)
Pt dropped off authorization for release of medical records to be filled out for [employer] (sleep mask replacement). Please call pt when forms are ready to be picked up. Forms in Dr. Derry Skill box/msn

## 2014-05-13 ENCOUNTER — Other Ambulatory Visit: Payer: Self-pay | Admitting: *Deleted

## 2014-05-13 MED ORDER — AMLODIPINE BESYLATE 10 MG PO TABS: 10.0000 mg | ORAL_TABLET | Freq: Every day | ORAL | Status: DC

## 2014-05-18 ENCOUNTER — Other Ambulatory Visit: Payer: Self-pay | Admitting: *Deleted

## 2014-05-18 ENCOUNTER — Telehealth: Payer: Self-pay | Admitting: *Deleted

## 2014-05-18 MED ORDER — AMLODIPINE BESYLATE 10 MG PO TABS
10.0000 mg | ORAL_TABLET | Freq: Every day | ORAL | Status: DC
Start: 2014-05-18 — End: 2014-12-11

## 2014-05-18 NOTE — Telephone Encounter (Signed)
Notified pt to call office and schedule an appt to have the form completed

## 2014-05-18 NOTE — Telephone Encounter (Signed)
Pt called stating that [facility] has been requesting a refill for Amlodipine with no response.  Pt chart shows that a refill for Amlodipine was sent to [place].  Sent Rx to [facility] as requested.

## 2014-05-18 NOTE — Telephone Encounter (Signed)
Per Dr Gilford Rile, Pt will need an appt to complete the requested form.

## 2014-05-18 NOTE — Telephone Encounter (Signed)
Pt called stating that Rx for Amlodipine has been requested by [facility], and they have not received any response.  Chart shows a Rx was sent last week to [facility].  Please call [place] to cancel this Rx, I already sent a new Rx to [facility].

## 2014-05-19 NOTE — Telephone Encounter (Signed)
Called and notified [place]

## 2014-05-22 ENCOUNTER — Ambulatory Visit: Payer: Managed Care, Other (non HMO) | Admitting: Internal Medicine

## 2014-07-03 ENCOUNTER — Ambulatory Visit (INDEPENDENT_AMBULATORY_CARE_PROVIDER_SITE_OTHER): Payer: Medicare Other | Admitting: Internal Medicine

## 2014-07-03 ENCOUNTER — Encounter: Payer: Self-pay | Admitting: Internal Medicine

## 2014-07-03 VITALS — BP 130/78 | HR 67 | Temp 97.8°F | Ht 69.0 in | Wt 250.8 lb

## 2014-07-03 DIAGNOSIS — E785 Hyperlipidemia, unspecified: Secondary | ICD-10-CM

## 2014-07-03 DIAGNOSIS — E119 Type 2 diabetes mellitus without complications: Secondary | ICD-10-CM

## 2014-07-03 DIAGNOSIS — G4733 Obstructive sleep apnea (adult) (pediatric): Secondary | ICD-10-CM

## 2014-07-03 DIAGNOSIS — I10 Essential (primary) hypertension: Secondary | ICD-10-CM

## 2014-07-03 MED ORDER — LOSARTAN POTASSIUM 50 MG PO TABS: 50.0000 mg | ORAL_TABLET | Freq: Every day | ORAL | Status: DC

## 2014-07-03 MED ORDER — ALPRAZOLAM 0.5 MG PO TABS: ORAL_TABLET | ORAL | Status: DC

## 2014-07-03 NOTE — Progress Notes (Signed)
Pre visit review using our clinic review tool, if applicable. No additional management support is needed unless otherwise documented below in the visit note. 

## 2014-07-03 NOTE — Patient Instructions (Signed)
Labs today.  Follow up in 3 months or sooner as needed. 

## 2014-07-03 NOTE — Assessment & Plan Note (Signed)
Will check A1c with labs today.Foot exam normal today.

## 2014-07-03 NOTE — Progress Notes (Signed)
Subjective:    Patient ID: Jesus Peterson, male    DOB: 22-Jun-1946, 68 y.o.   MRN: [id number]  HPI 68YO male presents for follow up.  DM - BG running 130-140s mostly. Checks 1x per week.   OSA - Wearing CPAP nightly. Needs a new mask. Changed health insurance, and needs approval from [place]. No current symptoms of fatigue, daytime somnolence.  No new concerns today.  Review of Systems  Constitutional: Negative for fever, chills, activity change, appetite change, fatigue and unexpected weight change.  Eyes: Negative for visual disturbance.  Respiratory: Negative for cough and shortness of breath.   Cardiovascular: Negative for chest pain, palpitations and leg swelling.  Gastrointestinal: Negative for abdominal pain and abdominal distention.  Genitourinary: Negative for dysuria, urgency and difficulty urinating.  Musculoskeletal: Negative for arthralgias and gait problem.  Skin: Negative for color change and rash.  Hematological: Negative for adenopathy.  Psychiatric/Behavioral: Negative for sleep disturbance and dysphoric mood. The patient is not nervous/anxious.        Objective:    BP 130/78  Pulse 67  Temp(Src) 97.8 F (36.6 C) (Oral)  Ht 5\' 9"  (1.753 m)  Wt 250 lb 12 oz (113.739 kg)  BMI 37.01 kg/m2  SpO2 96% Physical Exam  Constitutional: He is oriented to person, place, and time. He appears well-developed and well-nourished. No distress.  HENT:  Head: Normocephalic and atraumatic.  Right Ear: External ear normal.  Left Ear: External ear normal.  Nose: Nose normal.  Mouth/Throat: Oropharynx is clear and moist. No oropharyngeal exudate.  Eyes: Conjunctivae and EOM are normal. Pupils are equal, round, and reactive to light. Right eye exhibits no discharge. Left eye exhibits no discharge. No scleral icterus.  Neck: Normal range of motion. Neck supple. No tracheal deviation present. No thyromegaly present.  Cardiovascular: Normal rate, regular rhythm and normal heart  sounds.  Exam reveals no gallop and no friction rub.   No murmur heard. Pulmonary/Chest: Effort normal and breath sounds normal. No accessory muscle usage. Not tachypneic. No respiratory distress. He has no decreased breath sounds. He has no wheezes. He has no rhonchi. He has no rales. He exhibits no tenderness.  Musculoskeletal: Normal range of motion. He exhibits no edema.  Lymphadenopathy:    He has no cervical adenopathy.  Neurological: He is alert and oriented to person, place, and time. No cranial nerve deficit. Coordination normal.  Skin: Skin is warm and dry. No rash noted. He is not diaphoretic. No erythema. No pallor.  Psychiatric: He has a normal mood and affect. His behavior is normal. Judgment and thought content normal.          Assessment & Plan:   Problem List Items Addressed This Visit     Unprioritized   Diabetes type 2, controlled - Primary     Will check A1c with labs today.Foot exam normal today.    Relevant Medications      losartan (COZAAR) tablet   Other Relevant Orders      Comprehensive metabolic panel      Hemoglobin A1c      Lipid panel      Microalbumin / creatinine urine ratio   Hypertension      BP Readings from Last 3 Encounters:  07/03/14 130/78  02/06/14 140/80  01/09/14 160/100   BP well controlled. Renal function with labs. Continue Losartan and Carvedilol.    Relevant Medications      losartan (COZAAR) tablet   Obstructive sleep apnea  Compliant with CPAP, doing well. Needs new mask. Paperwork faxed to [employer].     Other and unspecified hyperlipidemia     Will check lipids and LFTs with labs today. Continue Simvastatin.    Relevant Medications      losartan (COZAAR) tablet       Return in about 3 months (around 10/02/2014) for Recheck of Diabetes.

## 2014-07-03 NOTE — Assessment & Plan Note (Signed)
BP Readings from Last 3 Encounters:  07/03/14 130/78  02/06/14 140/80  01/09/14 160/100   BP well controlled. Renal function with labs. Continue Losartan and Carvedilol.

## 2014-07-03 NOTE — Assessment & Plan Note (Signed)
Will check lipids and LFTs  with labs today. Continue Simvastatin. 

## 2014-07-03 NOTE — Assessment & Plan Note (Signed)
Compliant with CPAP, doing well. Needs new mask. Paperwork faxed to [employer].

## 2014-07-04 LAB — COMPREHENSIVE METABOLIC PANEL
ALT: 28 U/L (ref 0–53)
AST: 24 U/L (ref 0–37)
Albumin: 4.4 g/dL (ref 3.5–5.2)
Alkaline Phosphatase: 85 U/L (ref 39–117)
BUN: 21 mg/dL (ref 6–23)
CO2: 27 mEq/L (ref 19–32)
Calcium: 9.4 mg/dL (ref 8.4–10.5)
Chloride: 101 mEq/L (ref 96–112)
Creat: 1.18 mg/dL (ref 0.50–1.35)
Glucose, Bld: 103 mg/dL — ABNORMAL HIGH (ref 70–99)
Potassium: 3.5 mEq/L (ref 3.5–5.3)
Sodium: 141 mEq/L (ref 135–145)
Total Bilirubin: 0.6 mg/dL (ref 0.2–1.2)
Total Protein: 6.9 g/dL (ref 6.0–8.3)

## 2014-07-04 LAB — HEMOGLOBIN A1C
Hgb A1c MFr Bld: 6.8 % — ABNORMAL HIGH (ref <5.7)
Mean Plasma Glucose: 148 mg/dL — ABNORMAL HIGH (ref <117)

## 2014-07-04 LAB — LIPID PANEL
Cholesterol: 154 mg/dL (ref 0–200)
HDL: 37 mg/dL — ABNORMAL LOW (ref >39)
Total CHOL/HDL Ratio: 4.2 Ratio
Triglycerides: 490 mg/dL — ABNORMAL HIGH (ref <150)

## 2014-07-06 LAB — MICROALBUMIN / CREATININE URINE RATIO

## 2014-07-07 ENCOUNTER — Other Ambulatory Visit: Payer: Self-pay | Admitting: *Deleted

## 2014-07-07 MED ORDER — CARVEDILOL 12.5 MG PO TABS: 12.5000 mg | ORAL_TABLET | Freq: Two times a day (BID) | ORAL | Status: DC

## 2014-07-07 MED ORDER — SIMVASTATIN 40 MG PO TABS: 20.0000 mg | ORAL_TABLET | Freq: Every evening | ORAL | Status: DC

## 2014-07-17 ENCOUNTER — Telehealth: Payer: Self-pay | Admitting: Internal Medicine

## 2014-07-17 NOTE — Telephone Encounter (Signed)
Pt dropped off medicare transition patient checklist to be filled out and faxed to [facility]. Please fax and advise pt. Form in Dr. Thomes Dinning [email or web address]

## 2014-07-23 NOTE — Telephone Encounter (Signed)
Faxed on 07/07/14, again on 07/23/14

## 2014-07-24 DIAGNOSIS — L210 Seborrhea capitis: Secondary | ICD-10-CM | POA: Diagnosis not present

## 2014-07-24 DIAGNOSIS — L570 Actinic keratosis: Secondary | ICD-10-CM | POA: Diagnosis not present

## 2014-07-24 DIAGNOSIS — L219 Seborrheic dermatitis, unspecified: Secondary | ICD-10-CM | POA: Diagnosis not present

## 2014-07-24 DIAGNOSIS — L820 Inflamed seborrheic keratosis: Secondary | ICD-10-CM | POA: Diagnosis not present

## 2014-08-05 ENCOUNTER — Telehealth: Payer: Self-pay | Admitting: Internal Medicine

## 2014-08-05 ENCOUNTER — Other Ambulatory Visit: Payer: Self-pay | Admitting: *Deleted

## 2014-08-05 DIAGNOSIS — E119 Type 2 diabetes mellitus without complications: Secondary | ICD-10-CM

## 2014-08-05 MED ORDER — GLUCOSE BLOOD VI STRP: ORAL_STRIP | Status: DC

## 2014-08-05 MED ORDER — BAYER MICROLET LANCETS MISC: Status: DC

## 2014-08-05 NOTE — Telephone Encounter (Signed)
Jesus Peterson stopped by saying he needs a Refill for Lancets and Strips sent to the [facility] on [address]. He's out of Lancets. The pharmacy told him Dr. Gilford Rile would have to send a Rx. He also mentioned "[facility]" but I'm not sure what he was talking about. Please call the patient if you have questions. Pt ph# [phone] Thank you.

## 2014-08-14 ENCOUNTER — Other Ambulatory Visit: Payer: Self-pay | Admitting: *Deleted

## 2014-08-14 DIAGNOSIS — E119 Type 2 diabetes mellitus without complications: Secondary | ICD-10-CM

## 2014-08-14 MED ORDER — GLUCOSE BLOOD VI STRP: ORAL_STRIP | Status: DC

## 2014-08-14 MED ORDER — BAYER MICROLET LANCETS MISC: Status: DC

## 2014-08-14 NOTE — Telephone Encounter (Signed)
The patient called stating he is hoping to get his lancet and diabetic supplies sent to the [facility] on [address].  He states they are not there.  Thanks!

## 2014-10-14 ENCOUNTER — Encounter: Payer: Self-pay | Admitting: *Deleted

## 2014-10-30 ENCOUNTER — Ambulatory Visit (INDEPENDENT_AMBULATORY_CARE_PROVIDER_SITE_OTHER): Payer: Medicare Other | Admitting: Internal Medicine

## 2014-10-30 ENCOUNTER — Encounter: Payer: Self-pay | Admitting: Internal Medicine

## 2014-10-30 VITALS — BP 149/91 | HR 74 | Temp 97.6°F | Ht 69.0 in | Wt 249.5 lb

## 2014-10-30 DIAGNOSIS — R109 Unspecified abdominal pain: Secondary | ICD-10-CM

## 2014-10-30 LAB — POCT URINALYSIS DIPSTICK
Bilirubin, UA: NEGATIVE
Blood, UA: NEGATIVE
Glucose, UA: NEGATIVE
Ketones, UA: NEGATIVE
Leukocytes, UA: NEGATIVE
Nitrite, UA: NEGATIVE
Protein, UA: NEGATIVE
Spec Grav, UA: 1.02
Urobilinogen, UA: 1
pH, UA: 6.5

## 2014-10-30 NOTE — Patient Instructions (Signed)
Call immediately if fever, chills, worsening pain or other concerns.  We will send urine for culture and call with results.

## 2014-10-30 NOTE — Progress Notes (Signed)
Subjective:    Patient ID: Jesus Peterson, male    DOB: 1945-10-22, 69 y.o.   MRN: [id number]  HPI 69YO male presents for acute visit.  Flank pain - Aching pain in flank right>left. Off and on last couple of weeks. No sharp or severe pain. Not changed with movement. Two weeks ago had some tingling at urethral meatus. Also notes some dark coloration to urine. No dysuria. No change in urinary flow. Not taking any medication for pain. No fever, chills.  Past medical, surgical, family and social history per today's encounter.  Review of Systems  Constitutional: Negative for fever, chills, activity change, appetite change, fatigue and unexpected weight change.  Eyes: Negative for visual disturbance.  Respiratory: Negative for cough and shortness of breath.   Cardiovascular: Negative for chest pain, palpitations and leg swelling.  Gastrointestinal: Negative for nausea, vomiting, abdominal pain, diarrhea, constipation and abdominal distention.  Genitourinary: Positive for flank pain. Negative for dysuria, urgency, frequency, decreased urine volume, penile swelling, scrotal swelling, difficulty urinating, penile pain and testicular pain.  Musculoskeletal: Negative for arthralgias and gait problem.  Skin: Negative for color change and rash.  Hematological: Negative for adenopathy.  Psychiatric/Behavioral: Negative for sleep disturbance and dysphoric mood. The patient is not nervous/anxious.        Objective:    BP 149/91 mmHg  Pulse 74  Temp(Src) 97.6 F (36.4 C) (Oral)  Ht 5\' 9"  (1.753 m)  Wt 249 lb 8 oz (113.172 kg)  BMI 36.83 kg/m2  SpO2 97% Physical Exam  Constitutional: He is oriented to person, place, and time. He appears well-developed and well-nourished. No distress.  HENT:  Head: Normocephalic and atraumatic.  Right Ear: External ear normal.  Left Ear: External ear normal.  Nose: Nose normal.  Mouth/Throat: Oropharynx is clear and moist. No oropharyngeal exudate.  Eyes:  Conjunctivae and EOM are normal. Pupils are equal, round, and reactive to light. Right eye exhibits no discharge. Left eye exhibits no discharge. No scleral icterus.  Neck: Normal range of motion. Neck supple. No tracheal deviation present. No thyromegaly present.  Cardiovascular: Normal rate, regular rhythm and normal heart sounds.  Exam reveals no gallop and no friction rub.   No murmur heard. Pulmonary/Chest: Effort normal and breath sounds normal. No accessory muscle usage. No tachypnea. No respiratory distress. He has no decreased breath sounds. He has no wheezes. He has no rhonchi. He has no rales. He exhibits no tenderness.  Abdominal: There is no tenderness (no CVA tenderness).  Musculoskeletal: Normal range of motion. He exhibits no edema.  Lymphadenopathy:    He has no cervical adenopathy.  Neurological: He is alert and oriented to person, place, and time. No cranial nerve deficit. Coordination normal.  Skin: Skin is warm and dry. No rash noted. He is not diaphoretic. No erythema. No pallor.  Psychiatric: He has a normal mood and affect. His behavior is normal. Judgment and thought content normal.          Assessment & Plan:   Problem List Items Addressed This Visit      Unprioritized   Flank pain - Primary    Bilateral mild flank pain over last few weeks. Improving. Question if he may have already passed a stone. Urinalysis neg for blood. Will send urine culture. Will continue to monitor for now. If any recurrent or worsening pain, fever, chills, or other concerns, he will call and RTC.      Relevant Orders   POCT Urinalysis Dipstick (Completed)  CULTURE, URINE COMPREHENSIVE       Return in about 4 weeks (around 11/27/2014) for Wellness Visit.

## 2014-10-30 NOTE — Progress Notes (Signed)
Pre visit review using our clinic review tool, if applicable. No additional management support is needed unless otherwise documented below in the visit note. 

## 2014-10-30 NOTE — Assessment & Plan Note (Signed)
Bilateral mild flank pain over last few weeks. Improving. Question if he may have already passed a stone. Urinalysis neg for blood. Will send urine culture. Will continue to monitor for now. If any recurrent or worsening pain, fever, chills, or other concerns, he will call and RTC.

## 2014-11-01 LAB — CULTURE, URINE COMPREHENSIVE
Colony Count: NO GROWTH
Organism ID, Bacteria: NO GROWTH

## 2014-12-11 ENCOUNTER — Other Ambulatory Visit: Payer: Self-pay | Admitting: *Deleted

## 2014-12-11 ENCOUNTER — Telehealth: Payer: Self-pay | Admitting: Internal Medicine

## 2014-12-11 DIAGNOSIS — I10 Essential (primary) hypertension: Secondary | ICD-10-CM

## 2014-12-11 MED ORDER — SIMVASTATIN 40 MG PO TABS: 20.0000 mg | ORAL_TABLET | Freq: Every evening | ORAL | Status: DC

## 2014-12-11 MED ORDER — AMLODIPINE BESYLATE 10 MG PO TABS: 10.0000 mg | ORAL_TABLET | Freq: Every day | ORAL | Status: DC

## 2014-12-11 MED ORDER — CARVEDILOL 12.5 MG PO TABS: 12.5000 mg | ORAL_TABLET | Freq: Two times a day (BID) | ORAL | Status: DC

## 2014-12-11 MED ORDER — LOSARTAN POTASSIUM 50 MG PO TABS: 50.0000 mg | ORAL_TABLET | Freq: Every day | ORAL | Status: DC

## 2014-12-11 NOTE — Telephone Encounter (Signed)
Patient stated that [facility] said they did not get the fax for C pap supplies, please refax, Fax # [phone]

## 2014-12-14 ENCOUNTER — Other Ambulatory Visit: Payer: Self-pay | Admitting: *Deleted

## 2014-12-14 DIAGNOSIS — I10 Essential (primary) hypertension: Secondary | ICD-10-CM

## 2014-12-14 MED ORDER — CARVEDILOL 12.5 MG PO TABS: 12.5000 mg | ORAL_TABLET | Freq: Two times a day (BID) | ORAL | Status: DC

## 2014-12-14 MED ORDER — SIMVASTATIN 40 MG PO TABS: 20.0000 mg | ORAL_TABLET | Freq: Every evening | ORAL | Status: DC

## 2014-12-14 MED ORDER — AMLODIPINE BESYLATE 10 MG PO TABS: 10.0000 mg | ORAL_TABLET | Freq: Every day | ORAL | Status: DC

## 2014-12-14 MED ORDER — LOSARTAN POTASSIUM 50 MG PO TABS: 50.0000 mg | ORAL_TABLET | Freq: Every day | ORAL | Status: DC

## 2014-12-17 NOTE — Telephone Encounter (Signed)
Spoke with Ronnell Guadalajara at [facility], advised him that we havent received any order request for CPAP supplies.  He will be contacting the pt and sending a order request to [place].

## 2014-12-18 ENCOUNTER — Telehealth: Payer: Self-pay | Admitting: *Deleted

## 2014-12-18 NOTE — Telephone Encounter (Signed)
Pt came in requesting verification of his Losartin Rx.  States the writing on the bottle is different for previous Rx.  Pt also requesting status of C Pap order.  I returned pts call advised him that the Rx sent to [facility] on 2.29.16 was the same as previous Rx.  Further advised pt that Almyra Free spoke with Marshell Levan at [facility] on 3.3.16 and he was to send the order form for supplies.  We have not received the order.  Pt verbalized understanding.

## 2015-01-06 ENCOUNTER — Telehealth: Payer: Self-pay | Admitting: Internal Medicine

## 2015-01-06 DIAGNOSIS — E119 Type 2 diabetes mellitus without complications: Secondary | ICD-10-CM | POA: Diagnosis not present

## 2015-01-06 NOTE — Telephone Encounter (Signed)
Pt needs refill on CPAP supplies. Pt gets supplies from [place]. Please advise pt/msn

## 2015-01-07 NOTE — Telephone Encounter (Signed)
Order sent to [facility].

## 2015-01-13 DIAGNOSIS — E119 Type 2 diabetes mellitus without complications: Secondary | ICD-10-CM | POA: Diagnosis not present

## 2015-01-21 ENCOUNTER — Ambulatory Visit (INDEPENDENT_AMBULATORY_CARE_PROVIDER_SITE_OTHER): Payer: Medicare Other | Admitting: Podiatry

## 2015-01-21 ENCOUNTER — Encounter: Payer: Self-pay | Admitting: Podiatry

## 2015-01-21 ENCOUNTER — Telehealth: Payer: Self-pay

## 2015-01-21 VITALS — BP 167/95 | HR 73 | Resp 16 | Ht 71.0 in | Wt 240.0 lb

## 2015-01-21 DIAGNOSIS — L600 Ingrowing nail: Secondary | ICD-10-CM | POA: Diagnosis not present

## 2015-01-21 NOTE — Patient Instructions (Signed)
Ingrown Toenail An ingrown toenail occurs when the sharp edge of your toenail grows into the skin. Causes of ingrown toenails include toenails clipped too far back or poorly fitting shoes. Activities involving sudden stops (basketball, tennis) causing "toe jamming" may lead to an ingrown nail. HOME CARE INSTRUCTIONS   Soak the whole foot in warm soapy water for 20 minutes, 3 times per day.  You may lift the edge of the nail away from the sore skin by wedging a small piece of cotton under the corner of the nail. Be careful not to dig (traumatize) and cause more injury to the area.  Wear shoes that fit well. While the ingrown nail is causing problems, sandals may be beneficial.  Trim your toenails regularly and carefully. Cut your toenails straight across, not in a curve. This will prevent injury to the skin at the corners of the toenail.  Keep your feet clean and dry.  Crutches may be helpful early in treatment if walking is painful.  Antibiotics, if prescribed, should be taken as directed.  Return for a wound check in 2 days or as directed.  Only take over-the-counter or prescription medicines for pain, discomfort, or fever as directed by your caregiver. SEEK IMMEDIATE MEDICAL CARE IF:   You have a fever.  You have increasing pain, redness, swelling, or heat at the wound site.  Your toe is not better in 7 days. If conservative treatment is not successful, surgical removal of a portion or all of the nail may be necessary. MAKE SURE YOU:   Understand these instructions.  Will watch your condition.  Will get help right away if you are not doing well or get worse. Document Released: 09/29/2000 Document Revised: 12/25/2011 Document Reviewed: 09/23/2008 ExitCare Patient Information 2015 [employer]. This information is not intended to replace advice given to you by your health care provider. Make sure you discuss any questions you have with your health care provider.  

## 2015-01-21 NOTE — Telephone Encounter (Signed)
Fine to send

## 2015-01-21 NOTE — Telephone Encounter (Signed)
Received phone call from [place] at [facility] in regards to a titration CPAP request.  A signed order is needed with a note: Patient is using and benefiting from CPAP.  This can be a written on script and faxed over.  Please route back to me and I will follow up with [place].

## 2015-01-22 ENCOUNTER — Encounter: Payer: Self-pay | Admitting: Podiatry

## 2015-01-22 NOTE — Progress Notes (Signed)
Patient ID: Jesus Peterson, male   DOB: [birth year], 69 y.o.   MRN: [id number]  Subjective: 69 year old male presents the office today with complaints of painful ingrown toenail on his left big toe. He states that he typically tries to trim his toenails himself but he did not want to trim the nail himself at this time to make the situation worse. He states there is been a small amount of redness developing intermittently around the nail borders however he denies any drainage. He has a states that other nails are also ingrown and elongated and asking them to be looked at. No other complaints at this time.  Review of systems: Also systems reviewed and negative except otherwise stated above  Objective: AAO x3, NAD DP/PT pulses palpable bilaterally, CRT less than 3 seconds Protective sensation intact with Simms Weinstein monofilament, vibratory sensation intact, Achilles tendon reflex intact On the left hallux is evidence of incurvation on both the medial and lateral nail border with discomfort on the distal aspect of the nail border. There is no pain on the proximal medial/lateral nail border. There is a very faint rim of erythema along the medial nail border without any associated edema, ascending cellulitis. There is no drainage or purulence expressed. No malodor. There is also evidence of incurvation along the lesser digit nail borders bilaterally. There is no tenderness around those nails at this time. No areas of tenderness to bilateral lower extremities. MMT 5/5, ROM WNL.  No open lesions or pre-ulcerative lesions.  No overlying edema, erythema, increase in warmth to bilateral lower extremities.  No pain with calf compression, swelling, warmth, erythema bilaterally.    Assessment: 69 year old male symptomatic ingrown toenail   Plan: -Treatment options were discussed the patient including alternatives, risks, complications.  -At this time the patient is elected to proceed with a slant back  procedure. The nail sharply debrided without complication/bleeding to patient comfort. I discussed the patient that in the future if the area remains symptomatic or if there is any further problems would likely need a partial nail avulsion. He wishes to hold off on that at this time. Remaining nails were also debrided without complication/bleeding. Follow-up as needed.

## 2015-01-27 NOTE — Telephone Encounter (Signed)
[place] from [facility] called and is hoping to get a signed order for CPap titration along with a note stating the patient benefits from the c-pap machine.   She stated she is going to send over an order to sign.  (for the patient to receive the cpap machine)  Callback - [phone] ext [phone]

## 2015-01-27 NOTE — Telephone Encounter (Signed)
There was confusion in previous communications about CPAP titration and CPAP supplies.  Pt needs new supplies.  Spoke with [place] from [facility] and advised her of this.  She will be sending an order for supplies for [place] to sign and fax back asap.

## 2015-01-29 NOTE — Telephone Encounter (Signed)
Received and faxed back to [facility]

## 2015-02-12 ENCOUNTER — Ambulatory Visit (INDEPENDENT_AMBULATORY_CARE_PROVIDER_SITE_OTHER): Payer: Medicare Other

## 2015-02-12 VITALS — BP 130/72 | HR 71 | Temp 98.2°F | Resp 14 | Ht 71.0 in | Wt 251.8 lb

## 2015-02-12 DIAGNOSIS — E119 Type 2 diabetes mellitus without complications: Secondary | ICD-10-CM | POA: Diagnosis not present

## 2015-02-12 DIAGNOSIS — Z0000 Encounter for general adult medical examination without abnormal findings: Secondary | ICD-10-CM | POA: Diagnosis not present

## 2015-02-12 LAB — HEMOGLOBIN A1C: Hgb A1c MFr Bld: 6.8 % — ABNORMAL HIGH (ref 4.6–6.5)

## 2015-02-12 NOTE — Progress Notes (Signed)
Subjective:   Jesus Peterson is a 69 y.o. male who presents for Medicare Annual/Subsequent preventive examination.  Review of Systems:   Cardiac Risk Factors include: advanced age (>90men, >5 women);hypertension;male gender     Objective:    Vitals: BP 130/72 mmHg  Pulse 71  Temp(Src) 98.2 F (36.8 C) (Oral)  Resp 14  Ht 5\' 11"  (1.803 m)  Wt 251 lb 12 oz (114.193 kg)  BMI 35.13 kg/m2  SpO2 96%  Tobacco History  Smoking status  . Never Smoker   Smokeless tobacco  . Not on file     Counseling given: Not Answered   Past Medical History  Diagnosis Date  . Kidney stone     several  . Hypertension   . Hyperlipidemia   . Treadmill stress test negative for angina pectoris 2013    Dr. Nehemiah Massed, normal per pt  . Venous angioma of brain     Pons, found 2/2 tinnitus, followed on MRI  . Erectile dysfunction    No past surgical history on file. Family History  Problem Relation Age of Onset  . Stroke Mother   . Heart disease Mother     s/p CABG  . Heart disease Father   . Heart disease Brother     s/p CABG  . Heart disease Brother     s/p angioplasty  . Arthritis Brother    History  Sexual Activity  . Sexual Activity: Not on file    Outpatient Encounter Prescriptions as of 02/12/2015  Medication Sig  . ALPRAZolam (XANAX) 0.5 MG tablet Take 1 tablet 30-84minutes prior to procedure, then you may repeat x 1 at time of procedure.  Marland Kitchen amLODipine (NORVASC) 10 MG tablet Take 1 tablet (10 mg total) by mouth daily.  Marland Kitchen aspirin 81 MG tablet Take 81 mg by mouth daily.  Marland Kitchen BAYER MICROLET LANCETS lancets Use at home to test blood sugars twice a day  . carvedilol (COREG) 12.5 MG tablet Take 1 tablet (12.5 mg total) by mouth 2 (two) times daily with a meal.  . glucose blood (BAYER CONTOUR NEXT TEST) test strip Use as home to test blood sugars twice a day  . hydrocortisone 2.5 % cream Apply 1 application topically 2 (two) times daily.  Jerrye Beavers Polysacch (ALCORTIN  A) 1-2-1 % GEL Apply 1 application topically daily.  Marland Kitchen ketoconazole (NIZORAL) 2 % cream Apply 1 application topically 2 (two) times daily.  Marland Kitchen losartan (COZAAR) 50 MG tablet Take 1 tablet (50 mg total) by mouth daily.  . simvastatin (ZOCOR) 40 MG tablet Take 0.5 tablets (20 mg total) by mouth every evening.    Activities of Daily Living In your present state of health, do you have any difficulty performing the following activities: 02/12/2015  Hearing? Y  Vision? N  Difficulty concentrating or making decisions? N  Walking or climbing stairs? N  Dressing or bathing? N  Doing errands, shopping? N  Preparing Food and eating ? N  Using the Toilet? N  In the past six months, have you accidently leaked urine? N  Do you have problems with loss of bowel control? N  Managing your Medications? N  Managing your Finances? N  Housekeeping or managing your Housekeeping? N    Patient Care Team: Jackolyn Confer, MD as PCP - General (Internal Medicine) Corey Skains, MD as Referring Physician (Internal Medicine) Manya Silvas, MD (Gastroenterology) Oneta Rack, MD (Dermatology)   Assessment:     Exercise Activities and  Dietary recommendations Current Exercise Habits:: Home exercise routine;The patient has a physically strenous job, but has no regular exercise apart from work., Type of exercise: walking, Time (Minutes): 30, Frequency (Times/Week): 1, Weekly Exercise (Minutes/Week): 30, Intensity: Mild  Goals    . Peak Blood Glucose < 180    . Weight < 200 lb (90.719 kg)      Fall Risk Fall Risk  02/12/2015 02/06/2014 11/08/2012  Falls in the past year? No No No   Depression Screen PHQ 2/9 Scores 02/12/2015 02/06/2014 11/08/2012  PHQ - 2 Score 1 0 0    Cognitive Testing MMSE - Mini Mental State Exam 02/12/2015  Orientation to time 5  Orientation to Place 5  Registration 3  Attention/ Calculation 5  Recall 3  Language- name 2 objects 2  Language- repeat 1  Language- follow 3  step command 3  Language- read & follow direction 1  Write a sentence 1  Copy design 1  Total score 30    Immunization History  Administered Date(s) Administered  . Influenza Split 03/05/2011, 08/02/2012, 07/30/2014  . Influenza-Unspecified 07/28/2013  . Pneumococcal Conjugate-13 11/07/2013  . Pneumococcal Polysaccharide-23 03/05/2011   Screening Tests Health Maintenance  Topic Date Due  . TETANUS/TDAP  04/02/1964  . ZOSTAVAX  04/02/2005  . OPHTHALMOLOGY EXAM  09/08/2014  . HEMOGLOBIN A1C  01/01/2015  . INFLUENZA VACCINE  05/17/2015  . FOOT EXAM  07/04/2015  . URINE MICROALBUMIN  07/04/2015  . COLONOSCOPY  03/04/2020  . PNA vac Low Risk Adult  Completed      Plan:    During the course of the visit the patient was educated and counseled about the following appropriate screening and preventive services:   Vaccines to include Pneumoccal, Influenza, Hepatitis B, Td, Zostavax, HCV  Electrocardiogram  Cardiovascular Disease  Colorectal cancer screening  Diabetes screening  Prostate Cancer Screening  Glaucoma screening  Nutrition counseling   Smoking cessation counseling  Patient Instructions (the written plan) was given to the patient.    Geni Bers, LPN  05/18/2121

## 2015-02-12 NOTE — Progress Notes (Signed)
Annual Wellness Visit as completed by Health Coach was reviewed in full.  

## 2015-02-12 NOTE — Patient Instructions (Addendum)
Mr. Klatt , Thank you for taking time to come for your Medicare Wellness Visit. I appreciate your ongoing commitment to your health goals. Please review the following plan we discussed and let me know if I can assist you in the future.   These are the goals we discussed: Goals    . Peak Blood Glucose < 180    . Weight < 200 lb (90.719 kg)       This is a list of the screening recommended for you and due dates:  Health Maintenance  Topic Date Due  . Tetanus Vaccine  04/02/1964  . Shingles Vaccine  04/02/2005  . Eye exam for diabetics  09/08/2014  . Hemoglobin A1C  01/01/2015  . Flu Shot  05/17/2015  . Complete foot exam   07/04/2015  . Urine Protein Check  07/04/2015  . Colon Cancer Screening  03/04/2020  . Pneumonia vaccines  Completed    Screening for Type 2 Diabetes Screening is a way to check for type 2 diabetes in people who do not have symptoms of the disease, but who may likely develop diabetes in the future. Diabetes can lead to serious health problems, but finding diabetes early allows for early treatment. DIABETES RISK FACTORS   Family history of diabetes.  Diseases of the pancreas.  Obesity or being overweight.  Certain racial or ethnic groups:  American [place].  Pacific Islander.  Hispanic.  Asian.  African American.  High blood pressure (hypertension).  History of diabetes while pregnant (gestational diabetes).  Delivering a baby that weighed over 9 pounds.  Being inactive.  High cholesterol or triglycerides.  Age, especially over 36 years of age. WHO IS SCREENED Adults  Adults who have no risk factors and no symptoms should be screened starting at age 33. If the screening tests are normal, they should be repeated every 3 years.  Adults who do not have symptoms, but are overweight, should be screened before age 3.  Adults who do not have symptoms, but have 1 or more risk factors, should be screened.  Adults who have an A1c (3 month  average of blood glucose) greater than 5.7% or who had an impaired glucose tolerance (IGT) or impaired fasting glucose (IFG) on a previous test should be screened.  Pregnant women with or without risk factors should be screened.  Women who gave birth and had gestational diabetes should be screened. This testing should be done 6 to 12 weeks after the child is born. Children or Adolescents  Children and adolescents should be screened for type 2 diabetes if they are overweight and have 2 of the following risk factors:  Having a family history of type 2 diabetes.  Being a member of a high risk race or ethnic group.  Having signs of insulin resistance or conditions associated with insulin resistance.  Having a mother who had gestational diabetes while pregnant with him or her.  Screening should start at age 73 or at the onset of puberty, whichever comes first. This should be repeated every 2 years. SCREENING In a screening, your caregiver may:  Ask questions about your overall health. This will include questions about the health of close family members, too.  Ask about any diabetes-like symptoms you may have.  Perform a physical exam.  Order some tests that may include:  A fasting plasma glucose test. This measures the level of glucose in your blood. It is done after you have had nothing to eat but water (fasted) for 8 hours.  A random blood glucose test. This test is done without the need to fast.  An oral glucose tolerance test. This is a blood test done in 2 parts. First, a blood sample is taken after you have fasted. Then, another sample is taken after you drink a liquid that contains a lot of sugar.  An A1c test. This test shows how much glucose has been in your blood over the past 2 to 3 months. Document Released: 07/29/2009 Document Revised: 12/25/2011 Document Reviewed: 05/10/2011 [facility] Patient Information 2015 [place], [place]. This information is not intended to replace  advice given to you by your health care provider. Make sure you discuss any questions you have with your health care provider.

## 2015-04-23 ENCOUNTER — Other Ambulatory Visit: Payer: Self-pay | Admitting: Internal Medicine

## 2015-04-23 NOTE — Telephone Encounter (Signed)
Sent mychart on need for appt 

## 2015-04-26 DIAGNOSIS — Z85828 Personal history of other malignant neoplasm of skin: Secondary | ICD-10-CM | POA: Diagnosis not present

## 2015-04-26 DIAGNOSIS — L814 Other melanin hyperpigmentation: Secondary | ICD-10-CM | POA: Diagnosis not present

## 2015-04-26 DIAGNOSIS — L821 Other seborrheic keratosis: Secondary | ICD-10-CM | POA: Diagnosis not present

## 2015-04-26 DIAGNOSIS — L820 Inflamed seborrheic keratosis: Secondary | ICD-10-CM | POA: Diagnosis not present

## 2015-04-26 DIAGNOSIS — D485 Neoplasm of uncertain behavior of skin: Secondary | ICD-10-CM | POA: Diagnosis not present

## 2015-04-26 DIAGNOSIS — D229 Melanocytic nevi, unspecified: Secondary | ICD-10-CM | POA: Diagnosis not present

## 2015-04-26 DIAGNOSIS — L578 Other skin changes due to chronic exposure to nonionizing radiation: Secondary | ICD-10-CM | POA: Diagnosis not present

## 2015-04-26 DIAGNOSIS — L918 Other hypertrophic disorders of the skin: Secondary | ICD-10-CM | POA: Diagnosis not present

## 2015-04-26 DIAGNOSIS — L219 Seborrheic dermatitis, unspecified: Secondary | ICD-10-CM | POA: Diagnosis not present

## 2015-04-28 ENCOUNTER — Encounter: Payer: Self-pay | Admitting: Internal Medicine

## 2015-05-07 DIAGNOSIS — D2262 Melanocytic nevi of left upper limb, including shoulder: Secondary | ICD-10-CM | POA: Diagnosis not present

## 2015-05-07 DIAGNOSIS — L821 Other seborrheic keratosis: Secondary | ICD-10-CM | POA: Diagnosis not present

## 2015-05-07 DIAGNOSIS — D225 Melanocytic nevi of trunk: Secondary | ICD-10-CM | POA: Diagnosis not present

## 2015-05-07 DIAGNOSIS — X32XXXA Exposure to sunlight, initial encounter: Secondary | ICD-10-CM | POA: Diagnosis not present

## 2015-05-07 DIAGNOSIS — L570 Actinic keratosis: Secondary | ICD-10-CM | POA: Diagnosis not present

## 2015-05-07 DIAGNOSIS — D2271 Melanocytic nevi of right lower limb, including hip: Secondary | ICD-10-CM | POA: Diagnosis not present

## 2015-05-12 ENCOUNTER — Other Ambulatory Visit: Payer: Self-pay | Admitting: Internal Medicine

## 2015-05-21 ENCOUNTER — Ambulatory Visit (INDEPENDENT_AMBULATORY_CARE_PROVIDER_SITE_OTHER): Payer: Medicare Other | Admitting: Internal Medicine

## 2015-05-21 ENCOUNTER — Encounter: Payer: Self-pay | Admitting: Internal Medicine

## 2015-05-21 VITALS — BP 139/80 | HR 63 | Temp 97.5°F | Ht 71.0 in | Wt 250.4 lb

## 2015-05-21 DIAGNOSIS — I10 Essential (primary) hypertension: Secondary | ICD-10-CM | POA: Diagnosis not present

## 2015-05-21 DIAGNOSIS — E119 Type 2 diabetes mellitus without complications: Secondary | ICD-10-CM

## 2015-05-21 DIAGNOSIS — E785 Hyperlipidemia, unspecified: Secondary | ICD-10-CM

## 2015-05-21 LAB — COMPREHENSIVE METABOLIC PANEL
ALT: 26 U/L (ref 0–53)
AST: 20 U/L (ref 0–37)
Albumin: 4.2 g/dL (ref 3.5–5.2)
Alkaline Phosphatase: 78 U/L (ref 39–117)
BUN: 20 mg/dL (ref 6–23)
CO2: 23 mEq/L (ref 19–32)
Calcium: 9 mg/dL (ref 8.4–10.5)
Chloride: 105 mEq/L (ref 96–112)
Creatinine, Ser: 0.93 mg/dL (ref 0.40–1.50)
GFR: 85.34 mL/min (ref >60.00)
Glucose, Bld: 130 mg/dL — ABNORMAL HIGH (ref 70–99)
Potassium: 3.5 mEq/L (ref 3.5–5.1)
Sodium: 138 mEq/L (ref 135–145)
Total Bilirubin: 1 mg/dL (ref 0.2–1.2)
Total Protein: 6.8 g/dL (ref 6.0–8.3)

## 2015-05-21 LAB — LIPID PANEL
Cholesterol: 145 mg/dL (ref 0–200)
HDL: 36.3 mg/dL — ABNORMAL LOW (ref >39.00)
NonHDL: 108.92
Total CHOL/HDL Ratio: 4
Triglycerides: 252 mg/dL — ABNORMAL HIGH (ref 0.0–149.0)
VLDL: 50.4 mg/dL — ABNORMAL HIGH (ref 0.0–40.0)

## 2015-05-21 LAB — HEMOGLOBIN A1C: Hgb A1c MFr Bld: 6.8 % — ABNORMAL HIGH (ref 4.6–6.5)

## 2015-05-21 LAB — MICROALBUMIN / CREATININE URINE RATIO
Creatinine,U: 261.2 mg/dL
Microalb Creat Ratio: 0.6 mg/g (ref 0.0–30.0)
Microalb, Ur: 1.6 mg/dL (ref 0.0–1.9)

## 2015-05-21 LAB — HM DIABETES FOOT EXAM: HM Diabetic Foot Exam: NORMAL

## 2015-05-21 LAB — LDL CHOLESTEROL, DIRECT: Direct LDL: 84 mg/dL

## 2015-05-21 MED ORDER — ALPRAZOLAM 0.5 MG PO TABS: ORAL_TABLET | ORAL | Status: DC

## 2015-05-21 NOTE — Assessment & Plan Note (Signed)
Will check A1c with labs. Continue healthy diet and exercise. Foot exam normal today. Eye exam has been scheduled.

## 2015-05-21 NOTE — Patient Instructions (Signed)
Labs today.  Follow up in 6 months or sooner as needed. 

## 2015-05-21 NOTE — Progress Notes (Signed)
   Subjective:    Patient ID: Jesus Peterson, male    DOB: [birth year], 69 y.o.   MRN: [id number]  HPI  69YO male presents for follow up.  DM - BG typically near 150s fasting. Trying to follow a healthy diet and exercise.  HTN - Compliant with medications. No CP, HA, palpitations.  Past medical, surgical, family and social history per today's encounter.   Review of Systems  Constitutional: Negative for fever, chills, activity change, appetite change, fatigue and unexpected weight change.  Eyes: Negative for visual disturbance.  Respiratory: Negative for cough and shortness of breath.   Cardiovascular: Negative for chest pain, palpitations and leg swelling.  Gastrointestinal: Negative for abdominal pain and abdominal distention.  Genitourinary: Negative for dysuria, urgency and difficulty urinating.  Musculoskeletal: Negative for myalgias, arthralgias and gait problem.  Skin: Negative for color change and rash.  Hematological: Negative for adenopathy.  Psychiatric/Behavioral: Negative for sleep disturbance and dysphoric mood. The patient is not nervous/anxious.        Objective:    BP 139/80 mmHg  Pulse 63  Temp(Src) 97.5 F (36.4 C) (Oral)  Ht 5\' 11"  (1.803 m)  Wt 250 lb 6 oz (113.569 kg)  BMI 34.94 kg/m2  SpO2 98% Physical Exam  Constitutional: He is oriented to person, place, and time. He appears well-developed and well-nourished. No distress.  HENT:  Head: Normocephalic and atraumatic.  Right Ear: External ear normal.  Left Ear: External ear normal.  Nose: Nose normal.  Mouth/Throat: Oropharynx is clear and moist. No oropharyngeal exudate.  Eyes: Conjunctivae and EOM are normal. Pupils are equal, round, and reactive to light. Right eye exhibits no discharge. Left eye exhibits no discharge. No scleral icterus.  Neck: Normal range of motion. Neck supple. No tracheal deviation present. No thyromegaly present.  Cardiovascular: Normal rate, regular rhythm and normal heart  sounds.  Exam reveals no gallop and no friction rub.   No murmur heard. Pulmonary/Chest: Effort normal and breath sounds normal. No accessory muscle usage. No tachypnea. No respiratory distress. He has no decreased breath sounds. He has no wheezes. He has no rhonchi. He has no rales. He exhibits no tenderness.  Musculoskeletal: Normal range of motion. He exhibits no edema.  Lymphadenopathy:    He has no cervical adenopathy.  Neurological: He is alert and oriented to person, place, and time. No cranial nerve deficit. Coordination normal.  Skin: Skin is warm and dry. No rash noted. He is not diaphoretic. No erythema. No pallor.  Psychiatric: He has a normal mood and affect. His behavior is normal. Judgment and thought content normal.          Assessment & Plan:   Problem List Items Addressed This Visit      Unprioritized   Diabetes type 2, controlled - Primary    Will check A1c with labs. Continue healthy diet and exercise. Foot exam normal today. Eye exam has been scheduled.      Relevant Orders   Comprehensive metabolic panel   Hemoglobin A1c   Lipid panel   Microalbumin / creatinine urine ratio   Hyperlipidemia    Check lipids with labs today. Continue Simvastatin.      Hypertension    BP Readings from Last 3 Encounters:  05/21/15 139/80  02/12/15 130/72  01/21/15 167/95   BP well controlled. Renal function with labs. Continue current medications.          Return in about 6 months (around 11/21/2015) for Physical.

## 2015-05-21 NOTE — Progress Notes (Signed)
Pre visit review using our clinic review tool, if applicable. No additional management support is needed unless otherwise documented below in the visit note. 

## 2015-05-21 NOTE — Assessment & Plan Note (Signed)
Check lipids with labs today. Continue Simvastatin.

## 2015-05-21 NOTE — Assessment & Plan Note (Signed)
BP Readings from Last 3 Encounters:  05/21/15 139/80  02/12/15 130/72  01/21/15 167/95   BP well controlled. Renal function with labs. Continue current medications.

## 2015-06-03 DIAGNOSIS — H35033 Hypertensive retinopathy, bilateral: Secondary | ICD-10-CM | POA: Diagnosis not present

## 2015-06-03 DIAGNOSIS — H2513 Age-related nuclear cataract, bilateral: Secondary | ICD-10-CM | POA: Diagnosis not present

## 2015-06-10 ENCOUNTER — Ambulatory Visit (INDEPENDENT_AMBULATORY_CARE_PROVIDER_SITE_OTHER): Payer: Medicare Other | Admitting: Podiatry

## 2015-06-10 ENCOUNTER — Encounter: Payer: Self-pay | Admitting: Podiatry

## 2015-06-10 VITALS — BP 139/83 | HR 77 | Resp 18

## 2015-06-10 DIAGNOSIS — B351 Tinea unguium: Secondary | ICD-10-CM

## 2015-06-10 DIAGNOSIS — M79676 Pain in unspecified toe(s): Secondary | ICD-10-CM | POA: Diagnosis not present

## 2015-06-10 DIAGNOSIS — L600 Ingrowing nail: Secondary | ICD-10-CM

## 2015-06-10 NOTE — Progress Notes (Signed)
Patient ID: DRAYLON MERCADEL, male   DOB: [birth year], 69 y.o.   MRN: [id number]  Subjective: 69 y.o. returns the office today for painful, elongated, thickened toenails which he is unable to trim himself. He also states that he gets ingrown toenails if the nails are not trimmed. Denies any redness or drainage around the nails. Denies any acute changes since last appointment and no new complaints today. Denies any systemic complaints such as fevers, chills, nausea, vomiting.   Objective: AAO 3, NAD DP/PT pulses palpable, CRT less than 3 seconds Nails hypertrophic, dystrophic, elongated, brittle, discolored 10.  There is incurvation of the nails particular the hallux.There is tenderness overlying the nails 1-5 bilaterally. There is no surrounding erythema or drainage along the nail sites. No open lesions or pre-ulcerative lesions are identified. No other areas of tenderness bilateral lower extremities. No overlying edema, erythema, increased warmth. No pain with calf compression, swelling, warmth, erythema.  Assessment: Patient presents with symptomatic onychomycosis/ingrown toenails  Plan: -Treatment options including alternatives, risks, complications were discussed -Nails sharply debrided 10 without complication/bleeding. -Discussed daily foot inspection. If there are any changes, to call the office immediately.  -Follow-up in 3 months or sooner if any problems are to arise. In the meantime, encouraged to call the office with any questions, concerns, changes symptoms.  Celesta Gentile, DPM

## 2015-07-29 ENCOUNTER — Other Ambulatory Visit: Payer: Self-pay | Admitting: Internal Medicine

## 2015-08-03 ENCOUNTER — Ambulatory Visit: Payer: Medicare Other

## 2015-08-13 ENCOUNTER — Encounter: Payer: Self-pay | Admitting: Internal Medicine

## 2015-08-13 ENCOUNTER — Ambulatory Visit (INDEPENDENT_AMBULATORY_CARE_PROVIDER_SITE_OTHER): Payer: Medicare Other | Admitting: Internal Medicine

## 2015-08-13 VITALS — BP 138/81 | HR 61 | Temp 97.4°F | Ht 71.0 in | Wt 252.1 lb

## 2015-08-13 DIAGNOSIS — E785 Hyperlipidemia, unspecified: Secondary | ICD-10-CM | POA: Diagnosis not present

## 2015-08-13 DIAGNOSIS — E119 Type 2 diabetes mellitus without complications: Secondary | ICD-10-CM | POA: Diagnosis not present

## 2015-08-13 DIAGNOSIS — I10 Essential (primary) hypertension: Secondary | ICD-10-CM | POA: Diagnosis not present

## 2015-08-13 DIAGNOSIS — Z125 Encounter for screening for malignant neoplasm of prostate: Secondary | ICD-10-CM

## 2015-08-13 LAB — COMPREHENSIVE METABOLIC PANEL
ALT: 21 U/L (ref 0–53)
AST: 15 U/L (ref 0–37)
Albumin: 4.1 g/dL (ref 3.5–5.2)
Alkaline Phosphatase: 74 U/L (ref 39–117)
BUN: 16 mg/dL (ref 6–23)
CO2: 24 mEq/L (ref 19–32)
Calcium: 9.4 mg/dL (ref 8.4–10.5)
Chloride: 108 mEq/L (ref 96–112)
Creatinine, Ser: 1.07 mg/dL (ref 0.40–1.50)
GFR: 72.54 mL/min (ref >60.00)
Glucose, Bld: 158 mg/dL — ABNORMAL HIGH (ref 70–99)
Potassium: 4.4 mEq/L (ref 3.5–5.1)
Sodium: 145 mEq/L (ref 135–145)
Total Bilirubin: 0.6 mg/dL (ref 0.2–1.2)
Total Protein: 6.9 g/dL (ref 6.0–8.3)

## 2015-08-13 LAB — PSA, MEDICARE: PSA: 0.7 ng/ml (ref 0.10–4.00)

## 2015-08-13 LAB — HEMOGLOBIN A1C: Hgb A1c MFr Bld: 6.9 % — ABNORMAL HIGH (ref 4.6–6.5)

## 2015-08-13 NOTE — Assessment & Plan Note (Signed)
LFTs with labs. Continue Simvastatin.

## 2015-08-13 NOTE — Progress Notes (Signed)
Subjective:    Patient ID: Jesus Peterson, male    DOB: 21-Jun-1946, 69 y.o.   MRN: [id number]  HPI  69YO male presents for follow up.  DM - BG well controlled. No BG over 200. Compliant with medications.  Feeling well. Compliant with medications and CPAP. Planning to travel to [facility] to spend some time with his son.   Wt Readings from Last 3 Encounters:  08/13/15 252 lb 2 oz (114.363 kg)  05/21/15 250 lb 6 oz (113.569 kg)  02/12/15 251 lb 12 oz (114.193 kg)   BP Readings from Last 3 Encounters:  08/13/15 138/81  06/10/15 139/83  05/21/15 139/80    Past Medical History  Diagnosis Date  . Kidney stone     several  . Hypertension   . Hyperlipidemia   . Treadmill stress test negative for angina pectoris 2013    Dr. Nehemiah Massed, normal per pt  . Venous angioma of brain ([place])     Pons, found 2/2 tinnitus, followed on MRI  . Erectile dysfunction    Family History  Problem Relation Age of Onset  . Stroke Mother   . Heart disease Mother     s/p CABG  . Heart disease Father   . Heart disease Brother     s/p CABG  . Heart disease Brother     s/p angioplasty  . Arthritis Brother    No past surgical history on file. Social History   Social History  . Marital Status: Married    Spouse Name: N/A  . Number of Children: N/A  . Years of Education: N/A   Social History Main Topics  . Smoking status: Never Smoker   . Smokeless tobacco: None  . Alcohol Use: No  . Drug Use: None  . Sexual Activity: Not Asked   Other Topics Concern  . None   Social History Narrative   Lives in [place] with wife, 44years. Children - son 34 and daughter 21.   Work - Scientist, research (physical sciences)   Diet - regular   Exercise - limited, walks occas    Review of Systems  Constitutional: Negative for fever, chills, activity change, appetite change, fatigue and unexpected weight change.  Eyes: Negative for visual disturbance.  Respiratory: Negative for cough and shortness of breath.   Cardiovascular: Negative for chest  pain, palpitations and leg swelling.  Gastrointestinal: Negative for nausea, vomiting, abdominal pain, diarrhea, constipation and abdominal distention.  Genitourinary: Negative for dysuria, urgency and difficulty urinating.  Musculoskeletal: Negative for myalgias, arthralgias and gait problem.  Skin: Negative for color change and rash.  Hematological: Negative for adenopathy.  Psychiatric/Behavioral: Negative for suicidal ideas, sleep disturbance and dysphoric mood. The patient is not nervous/anxious.        Objective:    BP 138/81 mmHg  Pulse 61  Temp(Src) 97.4 F (36.3 C) (Oral)  Ht 5\' 11"  (1.803 m)  Wt 252 lb 2 oz (114.363 kg)  BMI 35.18 kg/m2  SpO2 98% Physical Exam  Constitutional: He is oriented to person, place, and time. He appears well-developed and well-nourished. No distress.  HENT:  Head: Normocephalic and atraumatic.  Right Ear: External ear normal.  Left Ear: External ear normal.  Nose: Nose normal.  Mouth/Throat: Oropharynx is clear and moist. No oropharyngeal exudate.  Eyes: Conjunctivae and EOM are normal. Pupils are equal, round, and reactive to light. Right eye exhibits no discharge. Left eye exhibits no discharge. No scleral icterus.  Neck: Normal range of motion. Neck supple. No tracheal deviation present.  No thyromegaly present.  Cardiovascular: Normal rate, regular rhythm and normal heart sounds.  Exam reveals no gallop and no friction rub.   No murmur heard. Pulmonary/Chest: Effort normal and breath sounds normal. No accessory muscle usage. No tachypnea. No respiratory distress. He has no decreased breath sounds. He has no wheezes. He has no rhonchi. He has no rales. He exhibits no tenderness.  Musculoskeletal: Normal range of motion. He exhibits no edema.  Lymphadenopathy:    He has no cervical adenopathy.  Neurological: He is alert and oriented to person, place, and time. No cranial nerve deficit. Coordination normal.  Skin: Skin is warm and dry. No rash  noted. He is not diaphoretic. No erythema. No pallor.  Psychiatric: He has a normal mood and affect. His behavior is normal. Judgment and thought content normal.          Assessment & Plan:   Problem List Items Addressed This Visit      Unprioritized   Diabetes type 2, controlled ([place]) - Primary    BG well controlled. Will check A1c with labs.      Relevant Orders   Comprehensive metabolic panel   Hemoglobin A1c   Hyperlipidemia    LFTs with labs. Continue Simvastatin.      Hypertension    BP Readings from Last 3 Encounters:  08/13/15 138/81  06/10/15 139/83  05/21/15 139/80   BP well controlled. Renal function with labs. Continue current medication.      Screening for prostate cancer    Discussed potential limitations of PSA testing. Will check PSA with labs.      Relevant Orders   PSA, Medicare       Return in about 6 months (around 02/11/2016) for Physical.

## 2015-08-13 NOTE — Assessment & Plan Note (Signed)
Discussed potential limitations of PSA testing. Will check PSA with labs.

## 2015-08-13 NOTE — Progress Notes (Signed)
Pre visit review using our clinic review tool, if applicable. No additional management support is needed unless otherwise documented below in the visit note. 

## 2015-08-13 NOTE — Patient Instructions (Signed)
Labs today.  Follow up in 6 months or sooner as needed. 

## 2015-08-13 NOTE — Assessment & Plan Note (Signed)
BG well controlled. Will check A1c with labs.

## 2015-08-13 NOTE — Assessment & Plan Note (Signed)
BP Readings from Last 3 Encounters:  08/13/15 138/81  06/10/15 139/83  05/21/15 139/80   BP well controlled. Renal function with labs. Continue current medication.

## 2015-09-16 ENCOUNTER — Ambulatory Visit: Payer: Medicare Other

## 2015-09-17 ENCOUNTER — Ambulatory Visit (INDEPENDENT_AMBULATORY_CARE_PROVIDER_SITE_OTHER): Payer: Medicare Other | Admitting: Sports Medicine

## 2015-09-17 ENCOUNTER — Encounter: Payer: Self-pay | Admitting: Sports Medicine

## 2015-09-17 DIAGNOSIS — E119 Type 2 diabetes mellitus without complications: Secondary | ICD-10-CM

## 2015-09-17 DIAGNOSIS — B351 Tinea unguium: Secondary | ICD-10-CM

## 2015-09-17 DIAGNOSIS — M79676 Pain in unspecified toe(s): Secondary | ICD-10-CM

## 2015-09-17 NOTE — Progress Notes (Signed)
Patient ID: Jesus Peterson, male   DOB: [birth year], 69 y.o.   MRN: [id number] Subjective: Jesus Peterson is a 69 y.o. male patient with history of type 2 diabetes who presents to office today complaining of long, painful nails  while ambulating in shoes; unable to trim. States that his toenails tend to ingrow, but hasn't been a problem lately. Patient states that the glucose reading this morning was not recorded. Patient denies any new changes in medication or new problems. Patient denies any new cramping, numbness, burning or tingling in the legs.  Patient Active Problem List   Diagnosis Date Noted  . Screening for prostate cancer 08/13/2015  . Obstructive sleep apnea 07/03/2014  . Medicare annual wellness visit, subsequent 02/06/2014  . Nonspecific abnormal electrocardiogram (ECG) (EKG) 02/06/2014  . Diabetes type 2, controlled ([place]) 08/11/2013  . GERD (gastroesophageal reflux disease) 04/05/2012  . Hypertension 03/04/2012  . Erectile dysfunction 03/04/2012  . Hyperlipidemia 03/04/2012   Current Outpatient Prescriptions on File Prior to Visit  Medication Sig Dispense Refill  . ALPRAZolam (XANAX) 0.5 MG tablet Take 1 tablet 30-54minutes prior to procedure, then you may repeat x 1 at time of procedure. 10 tablet 3  . amLODipine (NORVASC) 10 MG tablet TAKE 1 TABLET EVERY DAY 90 tablet 0  . aspirin 81 MG tablet Take 81 mg by mouth daily.    Marland Kitchen BAYER MICROLET LANCETS lancets Use at home to test blood sugars twice a day 100 each 6  . carvedilol (COREG) 12.5 MG tablet TAKE 1 TABLET TWICE DAILY WITH MEALS 180 tablet 0  . glucose blood (BAYER CONTOUR NEXT TEST) test strip Use as home to test blood sugars twice a day 100 each 6  . hydrocortisone 2.5 % cream Apply 1 application topically 2 (two) times daily.    Jerrye Beavers Polysacch (ALCORTIN A) 1-2-1 % GEL Apply 1 application topically daily.    Marland Kitchen ketoconazole (NIZORAL) 2 % cream Apply 1 application topically 2 (two) times daily.    Marland Kitchen  losartan (COZAAR) 50 MG tablet TAKE 1 TABLET EVERY DAY 90 tablet 3  . simvastatin (ZOCOR) 40 MG tablet Take 0.5 tablets (20 mg total) by mouth every evening. 90 tablet 1   No current facility-administered medications on file prior to visit.   Allergies  Allergen Reactions  . Contrast Media [Iodinated Diagnostic Agents]   . Penicillins     Labs: HEMOGLOBIN A1C- no recent lab  Objective: General: Patient is awake, alert, and oriented x 3 and in no acute distress.  Integument: Skin is warm, dry and supple bilateral. Nails are tender, long, thickened and  dystrophic with subungual debris, consistent with onychomycosis, 1-5 bilateral. No signs of infection. No open lesions or preulcerative lesions present bilateral. Remaining integument unremarkable.  Vasculature:  Dorsalis Pedis pulse 2/4 bilateral. Posterior Tibial pulse  1/4 bilateral.  Capillary fill time <3 sec 1-5 bilateral. Positive hair growth to the level of the digits. Temperature gradient within normal limits. No varicosities present bilateral. No edema present bilateral.   Neurology: The patient has intact sensation measured with a 5.07/10g Semmes Weinstein Monofilament at all pedal sites bilateral . Vibratory sensation intact bilateral with tuning fork. No Babinski sign present bilateral.   Musculoskeletal: No gross pedal deformities noted bilateral. Muscular strength 5/5 in all lower extremity muscular groups bilateral without pain or limitation on range of motion . No tenderness with calf compression bilateral.  Assessment and Plan: Problem List Items Addressed This Visit    None  Visit Diagnoses    Dermatophytosis of nail    -  Primary    Pain of toe, unspecified laterality        Diabetes mellitus without complication ([place])           -Examined patient. -Discussed and educated patient on diabetic foot care, especially with  regards to the vascular, neurological and musculoskeletal systems.  -Stressed the  importance of good glycemic control and the detriment of not  controlling glucose levels in relation to the foot. -Mechanically debrided all nails 1-5 bilateral using sterile nail nipper and filed with dremel without incident  -Answered all patient questions -Patient to return in 3 months for at risk foot care -Patient advised to call the office if any problems or questions arise in the  Meantime.  Landis Martins, DPM

## 2015-11-01 ENCOUNTER — Other Ambulatory Visit: Payer: Self-pay | Admitting: Internal Medicine

## 2015-11-14 ENCOUNTER — Other Ambulatory Visit: Payer: Self-pay | Admitting: Internal Medicine

## 2015-11-16 ENCOUNTER — Other Ambulatory Visit: Payer: Self-pay

## 2015-11-16 ENCOUNTER — Other Ambulatory Visit: Payer: Self-pay | Admitting: Internal Medicine

## 2015-11-16 MED ORDER — BAYER MICROLET LANCETS MISC: Status: DC

## 2015-11-23 ENCOUNTER — Other Ambulatory Visit: Payer: Self-pay

## 2015-11-23 MED ORDER — GLUCOSE BLOOD VI STRP: ORAL_STRIP | Status: DC

## 2015-11-23 MED ORDER — BLOOD GLUCOSE MONITOR KIT: PACK | Status: DC

## 2015-11-23 MED ORDER — BAYER MICROLET LANCETS MISC: Status: DC

## 2015-12-17 ENCOUNTER — Ambulatory Visit: Payer: Medicare Other | Admitting: Sports Medicine

## 2015-12-23 ENCOUNTER — Ambulatory Visit (INDEPENDENT_AMBULATORY_CARE_PROVIDER_SITE_OTHER): Payer: Medicare Other | Admitting: Podiatry

## 2015-12-23 ENCOUNTER — Encounter: Payer: Self-pay | Admitting: Podiatry

## 2015-12-23 DIAGNOSIS — B351 Tinea unguium: Secondary | ICD-10-CM | POA: Diagnosis not present

## 2015-12-23 DIAGNOSIS — M79676 Pain in unspecified toe(s): Secondary | ICD-10-CM

## 2015-12-23 DIAGNOSIS — L600 Ingrowing nail: Secondary | ICD-10-CM

## 2015-12-23 NOTE — Progress Notes (Signed)
Patient ID: Jesus Peterson, male   DOB: 10/28/1945, 70 y.o.   MRN: [id number]  Subjective: 70 y.o. returns the office today for painful, elongated, thickened toenails which he is unable to trim himself. Denies any redness or drainage around the nails. Denies any acute changes since last appointment and no new complaints today. Denies any systemic complaints such as fevers, chills, nausea, vomiting.   Objective: AAO 3, NAD DP/PT pulses palpable, CRT less than 3 seconds; pedal hair present Nails hypertrophic, dystrophic, elongated, brittle, discolored 10.  There is incurvation of the nails particular the hallux bilaterally.There is tenderness overlying the nails 1-5 bilaterally. There is no surrounding erythema or drainage along the nail sites. No open lesions or pre-ulcerative lesions are identified. No other areas of tenderness bilateral lower extremities. No overlying edema, erythema, increased warmth. No pain with calf compression, swelling, warmth, erythema.  Assessment: Patient presents with symptomatic onychomycosis/ingrown toenails  Plan: -Treatment options including alternatives, risks, complications were discussed -Nails sharply debrided 10 without complication/bleeding. -Discussed daily foot inspection. If there are any changes, to call the office immediately.  -Follow-up in 3 months or sooner if any problems are to arise. In the meantime, encouraged to call the office with any questions, concerns, changes symptoms.  Celesta Gentile, DPM

## 2016-02-11 ENCOUNTER — Encounter: Payer: Self-pay | Admitting: Internal Medicine

## 2016-02-11 ENCOUNTER — Ambulatory Visit (INDEPENDENT_AMBULATORY_CARE_PROVIDER_SITE_OTHER): Payer: Medicare Other | Admitting: Internal Medicine

## 2016-02-11 VITALS — BP 124/82 | HR 66 | Temp 97.5°F | Ht 71.0 in | Wt 251.0 lb

## 2016-02-11 DIAGNOSIS — E119 Type 2 diabetes mellitus without complications: Secondary | ICD-10-CM

## 2016-02-11 DIAGNOSIS — I10 Essential (primary) hypertension: Secondary | ICD-10-CM

## 2016-02-11 DIAGNOSIS — Z0000 Encounter for general adult medical examination without abnormal findings: Secondary | ICD-10-CM | POA: Diagnosis not present

## 2016-02-11 DIAGNOSIS — Z125 Encounter for screening for malignant neoplasm of prostate: Secondary | ICD-10-CM

## 2016-02-11 DIAGNOSIS — E785 Hyperlipidemia, unspecified: Secondary | ICD-10-CM

## 2016-02-11 LAB — MICROALBUMIN / CREATININE URINE RATIO
Creatinine,U: 277.9 mg/dL
Microalb Creat Ratio: 0.6 mg/g (ref 0.0–30.0)
Microalb, Ur: 1.8 mg/dL (ref 0.0–1.9)

## 2016-02-11 LAB — COMPREHENSIVE METABOLIC PANEL
ALT: 22 U/L (ref 0–53)
AST: 15 U/L (ref 0–37)
Albumin: 4.3 g/dL (ref 3.5–5.2)
Alkaline Phosphatase: 74 U/L (ref 39–117)
BUN: 24 mg/dL — ABNORMAL HIGH (ref 6–23)
CO2: 25 mEq/L (ref 19–32)
Calcium: 9.2 mg/dL (ref 8.4–10.5)
Chloride: 101 mEq/L (ref 96–112)
Creatinine, Ser: 1.05 mg/dL (ref 0.40–1.50)
GFR: 74.03 mL/min (ref >60.00)
Glucose, Bld: 141 mg/dL — ABNORMAL HIGH (ref 70–99)
Potassium: 4.3 mEq/L (ref 3.5–5.1)
Sodium: 134 mEq/L — ABNORMAL LOW (ref 135–145)
Total Bilirubin: 0.9 mg/dL (ref 0.2–1.2)
Total Protein: 6.8 g/dL (ref 6.0–8.3)

## 2016-02-11 LAB — LIPID PANEL
Cholesterol: 146 mg/dL (ref 0–200)
HDL: 37.8 mg/dL — ABNORMAL LOW (ref >39.00)
NonHDL: 107.9
Total CHOL/HDL Ratio: 4
Triglycerides: 269 mg/dL — ABNORMAL HIGH (ref 0.0–149.0)
VLDL: 53.8 mg/dL — ABNORMAL HIGH (ref 0.0–40.0)

## 2016-02-11 LAB — HEMOGLOBIN A1C: Hgb A1c MFr Bld: 6.9 % — ABNORMAL HIGH (ref 4.6–6.5)

## 2016-02-11 LAB — PSA, MEDICARE: PSA: 0.44 ng/ml (ref 0.10–4.00)

## 2016-02-11 LAB — LDL CHOLESTEROL, DIRECT: Direct LDL: 75 mg/dL

## 2016-02-11 NOTE — Patient Instructions (Signed)

## 2016-02-11 NOTE — Progress Notes (Signed)
Subjective:    Patient ID: Jesus Peterson, male    DOB: 1946/02/24, 70 y.o.   MRN: [id number]  HPI  The patient is here for annual Medicare Wellness Examination and management of other chronic and acute problems.   The risk factors are reflected in the history.  The roster of all physicians providing medical care to patient - is listed in the Snapshot section of the chart.  Activities of daily living:   The patient is 100% independent in all ADLs: dressing, toileting, feeding as well as independent mobility. Patient lives with wife. No pets. Occasionally cat stays with them. Home is one story. No recent falls.  Home safety :  The patient has smoke detectors in the home.  They wear seatbelts in their car. There are no firearms at home.  There is no violence in the home. They feel safe where they live.  Infectious Risks: There is no risks for hepatitis, STDs or HIV.  There is no  history of blood transfusion.  They have no travel history to infectious disease endemic areas of the world.  Additional Health Care Providers: The patient has seen their dentist in the last six months. Dentist - [place] They have seen their eye doctor in the last year. Opthalmologist - [place], Dr. Sabra Heck They deny hearing issues. They have deferred audiologic testing in the last year.   They do not  have excessive sun exposure. Discussed the need for sun protection: hats,long sleeves and use of sunscreen if there is significant sun exposure.  Dermatologist - Dr. Aubery Lapping  Diet: the importance of a healthy diet is discussed. They do have a healthy diet.  The benefits of regular aerobic exercise were discussed. Stays active  Depression screen: there are no signs or vegative symptoms of depression- irritability, change in appetite, anhedonia, sadness/tearfullness.  Cognitive assessment: the patient manages all their financial and personal affairs and is actively engaged. Manages own  finances.  HCPOA - none in place Living will in place.  The following portions of the patient's history were reviewed and updated as appropriate: allergies, current medications, past family history, past medical history,  past surgical history, past social history and problem list.  Visual acuity was not assessed per patient preference as they have regular follow up with their ophthalmologist. Hearing and body mass index were assessed and reviewed.   During the course of the visit the patient was educated and counseled about appropriate screening and preventive services including : fall prevention , diabetes screening, nutrition counseling, colorectal cancer screening, and recommended immunizations.     DIABETES - BG near 130-170. Working on [employer] and exercise.   Wt Readings from Last 3 Encounters:  02/11/16 251 lb (113.853 kg)  08/13/15 252 lb 2 oz (114.363 kg)  05/21/15 250 lb 6 oz (113.569 kg)   BP Readings from Last 3 Encounters:  02/11/16 124/82  08/13/15 138/81  06/10/15 139/83    Past Medical History  Diagnosis Date  . Kidney stone     several  . Hypertension   . Hyperlipidemia   . Treadmill stress test negative for angina pectoris 2013    Dr. Nehemiah Massed, normal per pt  . Venous angioma of brain ([place])     Pons, found 2/2 tinnitus, followed on MRI  . Erectile dysfunction    Family History  Problem Relation Age of Onset  . Stroke Mother   . Heart disease Mother     s/p CABG  . Heart disease Father   .  Heart disease Brother     s/p CABG  . Heart disease Brother     s/p angioplasty  . Arthritis Brother    History reviewed. No pertinent past surgical history. Social History   Social History  . Marital Status: Married    Spouse Name: N/A  . Number of Children: N/A  . Years of Education: N/A   Social History Main Topics  . Smoking status: Never Smoker   . Smokeless tobacco: None  . Alcohol Use: No  . Drug Use: None  . Sexual Activity: Not Asked    Other Topics Concern  . None   Social History Narrative   Lives in [place] with wife, 44years. Children - son 79 and daughter 34.   Work - Scientist, research (physical sciences)   Diet - regular   Exercise - limited, walks occas    Review of Systems  Constitutional: Negative for fever, chills, activity change, appetite change, fatigue and unexpected weight change.  Eyes: Negative for visual disturbance.  Respiratory: Negative for cough and shortness of breath.   Cardiovascular: Negative for chest pain, palpitations and leg swelling.  Gastrointestinal: Negative for nausea, vomiting, abdominal pain, diarrhea, constipation and abdominal distention.  Genitourinary: Negative for dysuria, urgency, decreased urine volume and difficulty urinating.  Musculoskeletal: Negative for arthralgias and gait problem.  Skin: Negative for color change and rash.  Neurological: Negative for weakness.  Hematological: Negative for adenopathy.  Psychiatric/Behavioral: Negative for suicidal ideas, sleep disturbance and dysphoric mood. The patient is not nervous/anxious.        Objective:    BP 124/82 mmHg  Pulse 66  Temp(Src) 97.5 F (36.4 C) (Oral)  Ht 5\' 11"  (1.803 m)  Wt 251 lb (113.853 kg)  BMI 35.02 kg/m2  SpO2 96% Physical Exam  Constitutional: He is oriented to person, place, and time. He appears well-developed and well-nourished. No distress.  HENT:  Head: Normocephalic and atraumatic.  Right Ear: External ear normal.  Left Ear: External ear normal.  Nose: Nose normal.  Mouth/Throat: Oropharynx is clear and moist. No oropharyngeal exudate.  Eyes: Conjunctivae and EOM are normal. Pupils are equal, round, and reactive to light. Right eye exhibits no discharge. Left eye exhibits no discharge. No scleral icterus.  Neck: Normal range of motion. Neck supple. No tracheal deviation present. No thyromegaly present.  Cardiovascular: Normal rate, regular rhythm and normal heart sounds.  Exam reveals no gallop and no friction rub.    No murmur heard. Pulmonary/Chest: Effort normal and breath sounds normal. No respiratory distress. He has no wheezes. He has no rales. He exhibits no tenderness.  Abdominal: Soft. Bowel sounds are normal. He exhibits no distension and no mass. There is no tenderness. There is no rebound and no guarding.  Musculoskeletal: Normal range of motion. He exhibits no edema.  Lymphadenopathy:    He has no cervical adenopathy.  Neurological: He is alert and oriented to person, place, and time. No cranial nerve deficit. Coordination normal.  Skin: Skin is warm and dry. No rash noted. He is not diaphoretic. No erythema. No pallor.  Psychiatric: He has a normal mood and affect. His behavior is normal. Judgment and thought content normal.          Assessment & Plan:  Patient was given a handout regarding current recommendations for health maintenance and preventative care on the AVS.  Problem List Items Addressed This Visit      Unprioritized   Diabetes type 2, controlled ([place])   Relevant Orders   Comprehensive metabolic panel  Hyperlipidemia   Relevant Orders   Hemoglobin A1c   Lipid panel   Hypertension   Medicare annual wellness visit, subsequent - Primary    General medical exam normal today. Encouraged healthy diet and exercise. Immunizations UTD. Will check labs including CBC, CMP, lipids, PSA, A1c. Appropriate screening performed. Colonoscopy UTD. Info for Cologuard given.        Relevant Orders   Microalbumin / creatinine urine ratio   Screening for prostate cancer   Relevant Orders   PSA, Medicare       Return in about 6 months (around 08/12/2016) for Recheck of Diabetes.  Ronette Deter, MD Internal Medicine [facility]

## 2016-02-11 NOTE — Assessment & Plan Note (Signed)
General medical exam normal today. Encouraged healthy diet and exercise. Immunizations UTD. Will check labs including CBC, CMP, lipids, PSA, A1c. Appropriate screening performed. Colonoscopy UTD. Info for Cologuard given.

## 2016-02-25 ENCOUNTER — Other Ambulatory Visit: Payer: Self-pay | Admitting: Internal Medicine

## 2016-02-28 ENCOUNTER — Other Ambulatory Visit: Payer: Self-pay | Admitting: Internal Medicine

## 2016-02-29 DIAGNOSIS — L918 Other hypertrophic disorders of the skin: Secondary | ICD-10-CM | POA: Diagnosis not present

## 2016-02-29 DIAGNOSIS — D485 Neoplasm of uncertain behavior of skin: Secondary | ICD-10-CM | POA: Diagnosis not present

## 2016-02-29 DIAGNOSIS — C44622 Squamous cell carcinoma of skin of right upper limb, including shoulder: Secondary | ICD-10-CM | POA: Diagnosis not present

## 2016-03-03 ENCOUNTER — Telehealth: Payer: Self-pay | Admitting: *Deleted

## 2016-03-03 MED ORDER — LOSARTAN POTASSIUM 50 MG PO TABS: 50.0000 mg | ORAL_TABLET | Freq: Every day | ORAL | Status: DC

## 2016-03-03 NOTE — Telephone Encounter (Signed)
Patient requested a medicatio refill for losartan and amlodipine Pharmacy [facility]

## 2016-03-03 NOTE — Telephone Encounter (Signed)
Refilled the Losartan only as the norvasc was refilled on the 15th already, thanks

## 2016-03-07 DIAGNOSIS — Z1211 Encounter for screening for malignant neoplasm of colon: Secondary | ICD-10-CM | POA: Diagnosis not present

## 2016-03-07 DIAGNOSIS — Z1212 Encounter for screening for malignant neoplasm of rectum: Secondary | ICD-10-CM | POA: Diagnosis not present

## 2016-03-07 LAB — COLOGUARD: Cologuard: NEGATIVE

## 2016-03-09 DIAGNOSIS — C44622 Squamous cell carcinoma of skin of right upper limb, including shoulder: Secondary | ICD-10-CM | POA: Diagnosis not present

## 2016-03-24 LAB — FECAL OCCULT BLOOD, GUAIAC: Fecal Occult Blood: NEGATIVE

## 2016-03-27 ENCOUNTER — Encounter: Payer: Self-pay | Admitting: Internal Medicine

## 2016-03-30 ENCOUNTER — Encounter: Payer: Self-pay | Admitting: Internal Medicine

## 2016-03-30 ENCOUNTER — Ambulatory Visit (INDEPENDENT_AMBULATORY_CARE_PROVIDER_SITE_OTHER): Payer: Medicare Other | Admitting: Podiatry

## 2016-03-30 DIAGNOSIS — M79676 Pain in unspecified toe(s): Secondary | ICD-10-CM

## 2016-03-30 DIAGNOSIS — B351 Tinea unguium: Secondary | ICD-10-CM | POA: Diagnosis not present

## 2016-04-02 NOTE — Progress Notes (Signed)
Patient ID: Jesus Peterson, male   DOB: Aug 04, 1946, 70 y.o.   MRN: [id number]  Subjective: 70 y.o. returns the office today for painful, elongated, thickened toenails which he is unable to trim himself. Denies any redness or drainage around the nails. Denies any acute changes since last appointment and no new complaints today. Denies any systemic complaints such as fevers, chills, nausea, vomiting.   Objective: AAO 3, NAD DP/PT pulses palpable, CRT less than 3 seconds; pedal hair present Nails hypertrophic, dystrophic, elongated, brittle, discolored 10.  There is incurvation of the nails particular the hallux bilaterally.There is tenderness overlying the nails 1-5 bilaterally. There is no surrounding erythema or drainage along the nail sites. No open lesions or pre-ulcerative lesions are identified. No other areas of tenderness bilateral lower extremities. No overlying edema, erythema, increased warmth. No pain with calf compression, swelling, warmth, erythema.  Assessment: Patient presents with symptomatic onychomycosis/ingrown toenails  Plan: -Treatment options including alternatives, risks, complications were discussed -Nails sharply debrided 10 without complication/bleeding. -Discussed daily foot inspection. If there are any changes, to call the office immediately.  -Follow-up in 3 months or sooner if any problems are to arise. In the meantime, encouraged to call the office with any questions, concerns, changes symptoms.  Celesta Gentile, DPM

## 2016-05-26 DIAGNOSIS — H5203 Hypermetropia, bilateral: Secondary | ICD-10-CM | POA: Diagnosis not present

## 2016-05-26 DIAGNOSIS — H25813 Combined forms of age-related cataract, bilateral: Secondary | ICD-10-CM | POA: Diagnosis not present

## 2016-05-26 DIAGNOSIS — H43393 Other vitreous opacities, bilateral: Secondary | ICD-10-CM | POA: Diagnosis not present

## 2016-06-12 ENCOUNTER — Other Ambulatory Visit: Payer: Self-pay

## 2016-06-12 MED ORDER — SIMVASTATIN 40 MG PO TABS: 20.0000 mg | ORAL_TABLET | Freq: Every evening | ORAL | 1 refills | Status: DC

## 2016-06-15 ENCOUNTER — Telehealth: Payer: Self-pay | Admitting: Internal Medicine

## 2016-06-15 MED ORDER — AMLODIPINE BESYLATE 10 MG PO TABS: 10.0000 mg | ORAL_TABLET | Freq: Every day | ORAL | 2 refills | Status: DC

## 2016-06-15 MED ORDER — CARVEDILOL 12.5 MG PO TABS: ORAL_TABLET | ORAL | 2 refills | Status: DC

## 2016-06-15 MED ORDER — SIMVASTATIN 40 MG PO TABS: 20.0000 mg | ORAL_TABLET | Freq: Every evening | ORAL | 1 refills | Status: DC

## 2016-06-15 NOTE — Telephone Encounter (Signed)
Pt made an appt to see Dr. [name] on 9/15. He is in need of his medications to be refilled. He needs amLODipine (NORVASC) 10 MG tablet , simvastatin (ZOCOR) 40 MG tablet, carvedilol (COREG) 12.5 MG tablet. These are filled by [facility].

## 2016-06-15 NOTE — Telephone Encounter (Signed)
Refilled. thanks

## 2016-06-30 ENCOUNTER — Ambulatory Visit (INDEPENDENT_AMBULATORY_CARE_PROVIDER_SITE_OTHER): Payer: Medicare Other | Admitting: Family Medicine

## 2016-06-30 ENCOUNTER — Encounter: Payer: Self-pay | Admitting: Family Medicine

## 2016-06-30 VITALS — BP 138/78 | HR 67 | Temp 98.4°F | Wt 253.0 lb

## 2016-06-30 DIAGNOSIS — I10 Essential (primary) hypertension: Secondary | ICD-10-CM

## 2016-06-30 DIAGNOSIS — D1800 Hemangioma unspecified site: Secondary | ICD-10-CM

## 2016-06-30 DIAGNOSIS — E785 Hyperlipidemia, unspecified: Secondary | ICD-10-CM | POA: Diagnosis not present

## 2016-06-30 DIAGNOSIS — E119 Type 2 diabetes mellitus without complications: Secondary | ICD-10-CM

## 2016-06-30 DIAGNOSIS — Z1159 Encounter for screening for other viral diseases: Secondary | ICD-10-CM | POA: Diagnosis not present

## 2016-06-30 DIAGNOSIS — M19042 Primary osteoarthritis, left hand: Secondary | ICD-10-CM

## 2016-06-30 NOTE — Patient Instructions (Signed)
Nice to see you. We'll check some lab work and call with the results. Please see an eye doctor yearly for diabetic eye exam. You can take ibuprofen or Aleve as needed over-the-counter for your left wrist.

## 2016-06-30 NOTE — Assessment & Plan Note (Signed)
At goal. Continue current medications. Labs per below.

## 2016-06-30 NOTE — Assessment & Plan Note (Signed)
Suspect symptoms related to osteoarthritis in his left hand. Advised on intermittent anti-inflammatories. Continue to monitor.

## 2016-06-30 NOTE — Progress Notes (Signed)
Pre visit review using our clinic review tool, if applicable. No additional management support is needed unless otherwise documented below in the visit note. 

## 2016-06-30 NOTE — Assessment & Plan Note (Signed)
Patient with angioma of pons. Neurologically intact. Continue to monitor. Given return precautions.

## 2016-06-30 NOTE — Progress Notes (Signed)
  [name], MD Phone: [phone]  Jesus Peterson is a 70 y.o. male who presents today for follow-up.  HYPERTENSION Disease Monitoring: Blood pressure range-130/80      Chest pain- no      Dyspnea- no Medications: Compliance- taking losartan, carvedilol, amlodipine    Edema- no  DIABETES Disease Monitoring: Blood Sugar ranges-not checking Polyuria/phagia/dipsia- some mild polyuria Has never been on medications. Diet controlled.  HYPERLIPIDEMIA Disease Monitoring: See symptoms for Hypertension Medications: Compliance- taking simvastatin Right upper quadrant pain- no  Muscle aches- no  Left wrist discomfort: Patient notes for about the last year he's had a mild discomfort at times in his left first carpal metacarpal joint. Notes it occasionally swells. Is a mild ache. No injury. Has not done anything for this.  Angioma: Patient reports he has an angioma in his pons. His been there for many years. Has not followed with neurology recently. Asymptomatic. No headaches, numbness, weakness, or vision changes.  PMH: nonsmoker.   ROS see history of present illness  Objective  Physical Exam Vitals:   06/30/16 1504  BP: 138/78  Pulse: 67  Temp: 98.4 F (36.9 C)    BP Readings from Last 3 Encounters:  06/30/16 138/78  02/11/16 124/82  08/13/15 138/81   Wt Readings from Last 3 Encounters:  06/30/16 253 lb (114.8 kg)  02/11/16 251 lb (113.9 kg)  08/13/15 252 lb 2 oz (114.4 kg)    Physical Exam  Constitutional: No distress.  Cardiovascular: Normal rate, regular rhythm and normal heart sounds.   Pulmonary/Chest: Effort normal and breath sounds normal.  Musculoskeletal: He exhibits no edema.  Left first carpometacarpal joint with minimal swelling, nontender, no snuffbox tenderness, no tenderness in his hands bilaterally  Neurological: He is alert. Gait normal.  CN 2-12 intact, 5/5 strength in bilateral biceps, triceps, grip, quads, hamstrings, plantar and  dorsiflexion, sensation to light touch intact in bilateral UE and LE, normal gait, 2+ patellar reflexes, no pronator drift, negative Romberg  Skin: Skin is warm and dry. He is not diaphoretic.     Assessment/Plan: Please see individual problem list.  Hypertension At goal. Continue current medications. Labs per below.  Diabetes type 2, controlled Diet controlled. Check A1c.  Hyperlipidemia Tolerating simvastatin.  Osteoarthritis of left hand Suspect symptoms related to osteoarthritis in his left hand. Advised on intermittent anti-inflammatories. Continue to monitor.  Angioma Patient with angioma of pons. Neurologically intact. Continue to monitor. Given return precautions.   Orders Placed This Encounter  Procedures  . Comp Met (CMET)  . HgB A1c  . Hepatitis C Antibody    [name], MD [place]

## 2016-06-30 NOTE — Assessment & Plan Note (Signed)
Diet controlled.  Check A1c.

## 2016-06-30 NOTE — Assessment & Plan Note (Signed)
Tolerating simvastatin.

## 2016-07-01 LAB — COMPREHENSIVE METABOLIC PANEL
ALT: 19 U/L (ref 9–46)
AST: 20 U/L (ref 10–35)
Albumin: 3.9 g/dL (ref 3.6–5.1)
Alkaline Phosphatase: 58 U/L (ref 40–115)
BUN: 25 mg/dL (ref 7–25)
CO2: 19 mmol/L — ABNORMAL LOW (ref 20–31)
Calcium: 8.8 mg/dL (ref 8.6–10.3)
Chloride: 108 mmol/L (ref 98–110)
Creat: 1.15 mg/dL (ref 0.70–1.18)
Glucose, Bld: 128 mg/dL — ABNORMAL HIGH (ref 65–99)
Potassium: 3.6 mmol/L (ref 3.5–5.3)
Sodium: 139 mmol/L (ref 135–146)
Total Bilirubin: 0.6 mg/dL (ref 0.2–1.2)
Total Protein: 6.1 g/dL (ref 6.1–8.1)

## 2016-07-01 LAB — HEMOGLOBIN A1C
Hgb A1c MFr Bld: 6.8 % — ABNORMAL HIGH (ref <5.7)
Mean Plasma Glucose: 148 mg/dL

## 2016-07-01 LAB — HEPATITIS C ANTIBODY: HCV Ab: NEGATIVE

## 2016-07-04 ENCOUNTER — Ambulatory Visit: Payer: Medicare Other | Admitting: Podiatry

## 2016-07-07 ENCOUNTER — Telehealth: Payer: Self-pay | Admitting: *Deleted

## 2016-07-07 DIAGNOSIS — E878 Other disorders of electrolyte and fluid balance, not elsewhere classified: Secondary | ICD-10-CM

## 2016-07-07 NOTE — Telephone Encounter (Signed)
Pt coming in for labs next week.  Need lab orders placed. ?CMET

## 2016-07-10 NOTE — Telephone Encounter (Signed)
Order placed

## 2016-07-13 ENCOUNTER — Encounter: Payer: Self-pay | Admitting: Podiatry

## 2016-07-13 ENCOUNTER — Ambulatory Visit (INDEPENDENT_AMBULATORY_CARE_PROVIDER_SITE_OTHER): Payer: Medicare Other | Admitting: Podiatry

## 2016-07-13 DIAGNOSIS — B351 Tinea unguium: Secondary | ICD-10-CM | POA: Diagnosis not present

## 2016-07-13 DIAGNOSIS — M79676 Pain in unspecified toe(s): Secondary | ICD-10-CM

## 2016-07-13 NOTE — Progress Notes (Signed)
Patient ID: Jesus Peterson, male   DOB: 01-Nov-1945, 70 y.o.   MRN: [id number]  Subjective: 70 y.o. returns the office today for painful, elongated, thickened toenails which he is unable to trim himself. Denies any redness or drainage around the nails. Denies any acute changes since last appointment and no new complaints today. Denies any systemic complaints such as fevers, chills, nausea, vomiting.   Objective: AAO 3, NAD DP/PT pulses palpable, CRT less than 3 seconds; pedal hair present Nails hypertrophic, dystrophic, elongated, brittle, discolored 10.  There is incurvation of the nails particular the hallux bilaterally left > right.There is tenderness overlying the nails 1-5 bilaterally. There is no surrounding erythema or drainage along the nail sites. No open lesions or pre-ulcerative lesions are identified. No other areas of tenderness bilateral lower extremities. No overlying edema, erythema, increased warmth. No pain with calf compression, swelling, warmth, erythema.  Assessment: Patient presents with symptomatic onychomycosis/ingrown toenails  Plan: -Treatment options including alternatives, risks, complications were discussed -Nails sharply debrided 10 without complication/bleeding. -Discussed daily foot inspection. If there are any changes, to call the office immediately.  -Follow-up in 3 months or sooner if any problems are to arise. In the meantime, encouraged to call the office with any questions, concerns, changes symptoms.  Celesta Gentile, DPM

## 2016-07-14 ENCOUNTER — Other Ambulatory Visit (INDEPENDENT_AMBULATORY_CARE_PROVIDER_SITE_OTHER): Payer: Medicare Other

## 2016-07-14 DIAGNOSIS — E878 Other disorders of electrolyte and fluid balance, not elsewhere classified: Secondary | ICD-10-CM

## 2016-07-14 LAB — COMPREHENSIVE METABOLIC PANEL
ALT: 21 U/L (ref 0–53)
AST: 15 U/L (ref 0–37)
Albumin: 4.1 g/dL (ref 3.5–5.2)
Alkaline Phosphatase: 70 U/L (ref 39–117)
BUN: 28 mg/dL — ABNORMAL HIGH (ref 6–23)
CO2: 27 mEq/L (ref 19–32)
Calcium: 9.3 mg/dL (ref 8.4–10.5)
Chloride: 104 mEq/L (ref 96–112)
Creatinine, Ser: 1.03 mg/dL (ref 0.40–1.50)
GFR: 75.6 mL/min (ref >60.00)
Glucose, Bld: 138 mg/dL — ABNORMAL HIGH (ref 70–99)
Potassium: 4.1 mEq/L (ref 3.5–5.1)
Sodium: 139 mEq/L (ref 135–145)
Total Bilirubin: 0.7 mg/dL (ref 0.2–1.2)
Total Protein: 7.4 g/dL (ref 6.0–8.3)

## 2016-08-18 ENCOUNTER — Ambulatory Visit: Payer: Medicare Other | Admitting: Internal Medicine

## 2016-09-05 ENCOUNTER — Telehealth: Payer: Self-pay | Admitting: Family Medicine

## 2016-09-05 NOTE — Telephone Encounter (Signed)
LM for patient to return call.

## 2016-09-05 NOTE — Telephone Encounter (Signed)
Pt called and stated that he received a letter from [facility] regarding how times a day he test his blood sugar. Just needs to be clarified to how many times a day that he should be testing so that they can cover the supplies.   Call pt @ [phone]

## 2016-09-05 NOTE — Telephone Encounter (Signed)
Patient can check his blood sugar one time a day. This should be fasting before breakfast. Thanks.

## 2016-09-05 NOTE — Telephone Encounter (Signed)
Please advise 

## 2016-09-06 NOTE — Telephone Encounter (Signed)
Pt returned phone call. Please call him at [phone].

## 2016-09-06 NOTE — Telephone Encounter (Signed)
Patient advised of below  

## 2016-09-11 DIAGNOSIS — L570 Actinic keratosis: Secondary | ICD-10-CM | POA: Diagnosis not present

## 2016-09-11 DIAGNOSIS — Z1283 Encounter for screening for malignant neoplasm of skin: Secondary | ICD-10-CM | POA: Diagnosis not present

## 2016-09-11 DIAGNOSIS — L218 Other seborrheic dermatitis: Secondary | ICD-10-CM | POA: Diagnosis not present

## 2016-09-11 DIAGNOSIS — D2239 Melanocytic nevi of other parts of face: Secondary | ICD-10-CM | POA: Diagnosis not present

## 2016-09-30 ENCOUNTER — Encounter: Payer: Self-pay | Admitting: Family Medicine

## 2016-10-05 ENCOUNTER — Telehealth: Payer: Self-pay | Admitting: *Deleted

## 2016-10-05 ENCOUNTER — Ambulatory Visit (INDEPENDENT_AMBULATORY_CARE_PROVIDER_SITE_OTHER): Payer: Medicare Other | Admitting: Podiatry

## 2016-10-05 ENCOUNTER — Ambulatory Visit: Payer: Medicare Other | Admitting: Podiatry

## 2016-10-05 DIAGNOSIS — L603 Nail dystrophy: Secondary | ICD-10-CM

## 2016-10-05 DIAGNOSIS — B351 Tinea unguium: Secondary | ICD-10-CM

## 2016-10-05 DIAGNOSIS — L608 Other nail disorders: Secondary | ICD-10-CM

## 2016-10-05 DIAGNOSIS — M79609 Pain in unspecified limb: Secondary | ICD-10-CM

## 2016-10-05 DIAGNOSIS — E0843 Diabetes mellitus due to underlying condition with diabetic autonomic (poly)neuropathy: Secondary | ICD-10-CM

## 2016-10-05 NOTE — Telephone Encounter (Signed)
Pt has requested a call in reference to having suggestion of treatment for his neck Please contact pt at [phone]

## 2016-10-05 NOTE — Telephone Encounter (Signed)
Please advise, he had sent a mychart on the 16th and is wanting to know what to do, not happy that he can't get in to see you today.  Thanks

## 2016-10-05 NOTE — Telephone Encounter (Signed)
Patient stated that he has a inflamed muscle in neck, pt has trouble moving neck. This happened following sleeping in a recliner. Pt used heating pad through out the night. Pt requested an appt.today  *Patient was transferred to the Nurse Line

## 2016-10-05 NOTE — Telephone Encounter (Signed)
Patient sent a message regarding this on the 16th, needs an appt, we have no appts available today , could get set up for the earliest next week.  Please advise once [facility] note is available.

## 2016-10-05 NOTE — Telephone Encounter (Signed)
Attempted to reach patient, busy signal, not able to leave a message. thanks

## 2016-10-05 NOTE — Telephone Encounter (Signed)
Patient can try Tylenol 1000 mg twice daily or ibuprofen 600 mg twice daily for the discomfort. He should take the ibuprofen with food. If he has pain radiating down his arms, has numbness, weakness, or worsening pain he should be evaluated. I can see him next week if he wants to wait until then.

## 2016-10-06 NOTE — Progress Notes (Signed)
SUBJECTIVE Patient with a history of diabetes mellitus presents to office today complaining of elongated, thickened nails. Pain while ambulating in shoes. Patient is unable to trim their own nails.   Allergies  Allergen Reactions  . Contrast Media [Iodinated Diagnostic Agents]   . Penicillins     OBJECTIVE General Patient is awake, alert, and oriented x 3 and in no acute distress. Derm Skin is dry and supple bilateral. Negative open lesions or macerations. Remaining integument unremarkable. Nails are tender, long, thickened and dystrophic with subungual debris, consistent with onychomycosis, 1-5 bilateral. No signs of infection noted. Vasc  DP and PT pedal pulses palpable bilaterally. Temperature gradient within normal limits.  Neuro Epicritic and protective threshold sensation diminished bilaterally.  Musculoskeletal Exam No symptomatic pedal deformities noted bilateral. Muscular strength within normal limits.  ASSESSMENT 1. Diabetes Mellitus w/ peripheral neuropathy 2. Onychomycosis of nail due to dermatophyte bilateral 3. Pain in foot bilateral  PLAN OF CARE 1. Patient evaluated today. 2. Instructed to maintain good pedal hygiene and foot care. Stressed importance of controlling blood sugar.  3. Mechanical debridement of nails 1-5 bilaterally performed using a nail nipper. Filed with dremel without incident.  4. Return to clinic in 3 mos.     Brent M. Evans, DPM [facility]  Dr. Brent M. Evans, DPM   [address]                                        [place], [place] [address]                Office [phone]  Fax [phone]   

## 2016-10-06 NOTE — Telephone Encounter (Signed)
Attempted to reach patient again, left Message.

## 2016-10-10 NOTE — Telephone Encounter (Signed)
Two calls made, note closed until patient returns the call, thanks

## 2016-10-18 ENCOUNTER — Encounter: Payer: Self-pay | Admitting: Family Medicine

## 2016-10-18 ENCOUNTER — Ambulatory Visit (INDEPENDENT_AMBULATORY_CARE_PROVIDER_SITE_OTHER): Payer: Medicare Other | Admitting: Family Medicine

## 2016-10-18 VITALS — BP 132/82 | HR 64 | Temp 97.6°F | Wt 254.8 lb

## 2016-10-18 DIAGNOSIS — E119 Type 2 diabetes mellitus without complications: Secondary | ICD-10-CM

## 2016-10-18 DIAGNOSIS — S161XXA Strain of muscle, fascia and tendon at neck level, initial encounter: Secondary | ICD-10-CM

## 2016-10-18 DIAGNOSIS — M542 Cervicalgia: Secondary | ICD-10-CM | POA: Insufficient documentation

## 2016-10-18 LAB — BASIC METABOLIC PANEL
BUN: 25 mg/dL — ABNORMAL HIGH (ref 6–23)
CO2: 24 mEq/L (ref 19–32)
Calcium: 9.4 mg/dL (ref 8.4–10.5)
Chloride: 104 mEq/L (ref 96–112)
Creatinine, Ser: 1.08 mg/dL (ref 0.40–1.50)
GFR: 71.53 mL/min (ref >60.00)
Glucose, Bld: 188 mg/dL — ABNORMAL HIGH (ref 70–99)
Potassium: 4.3 mEq/L (ref 3.5–5.1)
Sodium: 138 mEq/L (ref 135–145)

## 2016-10-18 LAB — HEMOGLOBIN A1C: Hgb A1c MFr Bld: 7.2 % — ABNORMAL HIGH (ref 4.6–6.5)

## 2016-10-18 NOTE — Assessment & Plan Note (Signed)
Appears to have had somewhat worsening control recently. We'll check an A1c today. Discussed potential for medication treatment versus diet and exercise.

## 2016-10-18 NOTE — Progress Notes (Signed)
  [name], MD Phone: [phone]  Jesus Peterson is a 71 y.o. male who presents today for same-day visit.  Patient notes left-sided neck pain for the last 2 weeks. Notes it in the posterior portion of his neck. Started after sleeping on his neck wrong. Notes it is severe at times and feels as though it is a sudden onset severe pain that goes away relatively quickly. Notes that has occurred several times last week. Has not occurred in the last week. No radiation down his arms. No numbness or weakness. Notes discomfort on turning his head to the left though has full range of motion. Has been taking ibuprofen 400 mg daily.  Diabetes: No polyuria or polydipsia. Blood sugars run in the 170s or 180s. Has not been working on diet or exercise. Would prefer to stay away for medication.  PMH: nonsmoker.   ROS see history of present illness  Objective  Physical Exam Vitals:   10/18/16 1322  BP: 132/82  Pulse: 64  Temp: 97.6 F (36.4 C)    BP Readings from Last 3 Encounters:  10/18/16 132/82  06/30/16 138/78  02/11/16 124/82   Wt Readings from Last 3 Encounters:  10/18/16 254 lb 12.8 oz (115.6 kg)  06/30/16 253 lb (114.8 kg)  02/11/16 251 lb (113.9 kg)    Physical Exam  Constitutional: No distress.  HENT:  Head: Normocephalic and atraumatic.  Cardiovascular: Normal rate, regular rhythm and normal heart sounds.   Pulmonary/Chest: Effort normal.  Musculoskeletal:  No midline spine tenderness, no midline spine step-off, no muscular neck tenderness, full range of motion of the neck, some discomfort on rotation to the left and flexion of the neck  Neurological: He is alert.  5/5 strength in bilateral biceps, triceps, grip, quads, hamstrings, plantar and dorsiflexion, sensation to light touch intact in bilateral UE and LE, normal gait  Skin: Skin is warm and dry. He is not diaphoretic.     Assessment/Plan: Please see individual problem list.  Cervical muscle strain, initial  encounter Suspect muscle strain as cause of patient's discomfort. Relatively benign exam today. Discussed 600 mg of ibuprofen every 8 hours as needed. If he has recurrence of this severe discomfort he can start on a schedule of 600 mg every 8 hours for 2 days. Given exercises to complete as well. Discuss proper sleeping technique. Given return precautions.  Diabetes type 2, controlled Appears to have had somewhat worsening control recently. We'll check an A1c today. Discussed potential for medication treatment versus diet and exercise.   Orders Placed This Encounter  Procedures  . HgB A1c  . Basic Metabolic Panel (BMET)    [name], MD [place]

## 2016-10-18 NOTE — Progress Notes (Signed)
Pre visit review using our clinic review tool, if applicable. No additional management support is needed unless otherwise documented below in the visit note. 

## 2016-10-18 NOTE — Assessment & Plan Note (Signed)
Suspect muscle strain as cause of patient's discomfort. Relatively benign exam today. Discussed 600 mg of ibuprofen every 8 hours as needed. If he has recurrence of this severe discomfort he can start on a schedule of 600 mg every 8 hours for 2 days. Given exercises to complete as well. Discuss proper sleeping technique. Given return precautions.

## 2016-10-18 NOTE — Patient Instructions (Signed)
Nice to see you. You likely strained a muscle in your neck. You can take ibuprofen 600 mg every 8 hours as needed for pain. You can also use heat. Please do the following exercises. We'll check some lab work and call you with the results.   Cervical Strain and Sprain Rehab Ask your health care provider which exercises are safe for you. Do exercises exactly as told by your health care provider and adjust them as directed. It is normal to feel mild stretching, pulling, tightness, or discomfort as you do these exercises, but you should stop right away if you feel sudden pain or your pain gets worse.Do not begin these exercises until told by your health care provider. Stretching and range of motion exercises These exercises warm up your muscles and joints and improve the movement and flexibility of your neck. These exercises also help to relieve pain, numbness, and tingling. Exercise A: Cervical side bend 1. Using good posture, sit on a stable chair or stand up. 2. Without moving your shoulders, slowly tilt your left / right ear to your shoulder until you feel a stretch in your neck muscles. You should be looking straight ahead. 3. Hold for __________ seconds. 4. Repeat with the other side of your neck. Repeat __________ times. Complete this exercise __________ times a day. Exercise B: Cervical rotation 1. Using good posture, sit on a stable chair or stand up. 2. Slowly turn your head to the side as if you are looking over your left / right shoulder.  Keep your eyes level with the ground.  Stop when you feel a stretch along the side and the back of your neck. 3. Hold for __________ seconds. 4. Repeat this by turning to your other side. Repeat __________ times. Complete this exercise __________ times a day. Exercise C: Thoracic extension and pectoral stretch 1. Roll a towel or a small blanket so it is about 4 inches (10 cm) in diameter. 2. Lie down on your back on a firm surface. 3. Put the  towel lengthwise, under your spine in the middle of your back. It should not be not under your shoulder blades. The towel should line up with your spine from your middle back to your lower back. 4. Put your hands behind your head and let your elbows fall out to your sides. 5. Hold for __________ seconds. Repeat __________ times. Complete this exercise __________ times a day. Strengthening exercises These exercises build strength and endurance in your neck. Endurance is the ability to use your muscles for a long time, even after your muscles get tired. Exercise D: Upper cervical flexion, isometric 1. Lie on your back with a thin pillow behind your head and a small rolled-up towel under your neck. 2. Gently tuck your chin toward your chest and nod your head down to look toward your feet. Do not lift your head off the pillow. 3. Hold for __________ seconds. 4. Release the tension slowly. Relax your neck muscles completely before you repeat this exercise. Repeat __________ times. Complete this exercise __________ times a day. Exercise E: Cervical extension, isometric 1. Stand about 6 inches (15 cm) away from a wall, with your back facing the wall. 2. Place a soft object, about 6-8 inches (15-20 cm) in diameter, between the back of your head and the wall. A soft object could be a small pillow, a ball, or a folded towel. 3. Gently tilt your head back and press into the soft object. Keep your jaw and forehead  relaxed. 4. Hold for __________ seconds. 5. Release the tension slowly. Relax your neck muscles completely before you repeat this exercise. Repeat __________ times. Complete this exercise __________ times a day. Posture and body mechanics   Body mechanics refers to the movements and positions of your body while you do your daily activities. Posture is part of body mechanics. Good posture and healthy body mechanics can help to relieve stress in your body's tissues and joints. Good posture means  that your spine is in its natural S-curve position (your spine is neutral), your shoulders are pulled back slightly, and your head is not tipped forward. The following are general guidelines for applying improved posture and body mechanics to your everyday activities. Standing  When standing, keep your spine neutral and keep your feet about hip-width apart. Keep a slight bend in your knees. Your ears, shoulders, and hips should line up.  When you do a task in which you stand in one place for a long time, place one foot up on a stable object that is 2-4 inches (5-10 cm) high, such as a footstool. This helps keep your spine neutral. Sitting  When sitting, keep your spine neutral and your keep feet flat on the floor. Use a footrest, if necessary, and keep your thighs parallel to the floor. Avoid rounding your shoulders, and avoid tilting your head forward.  When working at a desk or a computer, keep your desk at a height where your hands are slightly lower than your elbows. Slide your chair under your desk so you are close enough to maintain good posture.  When working at a computer, place your monitor at a height where you are looking straight ahead and you do not have to tilt your head forward or downward to look at the screen. Resting When lying down and resting, avoid positions that are most painful for you. Try to support your neck in a neutral position. You can use a contour pillow or a small rolled-up towel. Your pillow should support your neck but not push on it. This information is not intended to replace advice given to you by your health care provider. Make sure you discuss any questions you have with your health care provider. Document Released: 10/02/2005 Document Revised: 06/08/2016 Document Reviewed: 09/08/2015 Elsevier Interactive Patient Education  2017 [employer]   Diet Recommendations  Starchy (carb) foods: Bread, rice, pasta, potatoes, corn, cereal, grits, crackers, bagels,  muffins, all baked goods.  (Fruits, milk, and yogurt also have carbohydrate, but most of these foods will not spike your blood sugar as the starchy foods will.)  A few fruits do cause high blood sugars; use small portions of bananas (limit to 1/2 at a time), grapes, watermelon, oranges, and most tropical fruits.    Protein foods: Meat, fish, poultry, eggs, dairy foods, and beans such as pinto and kidney beans (beans also provide carbohydrate).   1. Eat at least 3 meals and 1-2 snacks per day. Never go more than 4-5 hours while awake without eating. Eat breakfast within the first hour of getting up.   2. Limit starchy foods to TWO per meal and ONE per snack. ONE portion of a starchy  food is equal to the following:   - ONE slice of bread (or its equivalent, such as half of a hamburger bun).   - 1/2 cup of a "scoopable" starchy food such as potatoes or rice.   - 15 grams of carbohydrate as shown on food label.  3. Include  at every meal: a protein food, a carb food, and vegetables and/or fruit.   - Obtain twice the volume of veg's as protein or carbohydrate foods for both lunch and dinner.   - Fresh or frozen veg's are best.   - Keep frozen veg's on hand for a quick vegetable serving.

## 2016-10-20 ENCOUNTER — Telehealth: Payer: Self-pay | Admitting: Family Medicine

## 2016-10-20 ENCOUNTER — Other Ambulatory Visit: Payer: Self-pay | Admitting: Family Medicine

## 2016-10-20 MED ORDER — METFORMIN HCL 500 MG PO TABS
500.0000 mg | ORAL_TABLET | Freq: Two times a day (BID) | ORAL | 3 refills | Status: DC
Start: 2016-10-20 — End: 2017-12-24

## 2016-10-20 NOTE — Telephone Encounter (Signed)
Pt called back in regards to results. Thank you!  Call pt @ [phone]

## 2016-10-20 NOTE — Telephone Encounter (Signed)
See other message

## 2016-10-23 ENCOUNTER — Encounter: Payer: Self-pay | Admitting: Family Medicine

## 2016-11-03 ENCOUNTER — Encounter: Payer: Self-pay | Admitting: Family Medicine

## 2016-11-03 LAB — LIPID PANEL
Cholesterol: 156 mg/dL (ref 0–200)
HDL: 49 mg/dL (ref 35–70)
LDL Cholesterol: 55 mg/dL
Triglycerides: 261 mg/dL — AB (ref 40–160)

## 2016-11-03 LAB — BASIC METABOLIC PANEL
BUN: 16 mg/dL (ref 4–21)
Creatinine: 1.1 mg/dL (ref 0.6–1.3)
Glucose: 160 mg/dL
Potassium: 4.6 mmol/L (ref 3.4–5.3)
Sodium: 141 mmol/L (ref 137–147)

## 2016-11-03 LAB — HEMOGLOBIN A1C: Hemoglobin A1C: 7

## 2016-11-10 ENCOUNTER — Telehealth: Payer: Self-pay | Admitting: Family Medicine

## 2016-11-10 ENCOUNTER — Encounter: Payer: Self-pay | Admitting: Family Medicine

## 2016-11-10 NOTE — Telephone Encounter (Signed)
Pt dropped off test results that Dr. [name] asked for. Results are in Dr. Ellen Henri color folder.

## 2016-11-10 NOTE — Telephone Encounter (Signed)
noted 

## 2016-12-04 ENCOUNTER — Telehealth: Payer: Self-pay | Admitting: Family Medicine

## 2016-12-04 MED ORDER — BLOOD GLUCOSE MONITOR KIT: PACK | 0 refills | Status: DC

## 2016-12-04 NOTE — Telephone Encounter (Signed)
Prescription printed. Please fax to his pharmacy.

## 2016-12-04 NOTE — Telephone Encounter (Signed)
Faxed to [facility] per patient request

## 2016-12-04 NOTE — Telephone Encounter (Signed)
Please advise 

## 2016-12-04 NOTE — Telephone Encounter (Signed)
Pt was into the office today. He would like to get a new sugar readings meter. He would like to have the most up to date meter that would be cover by his insurance . You can reach him at [phone].

## 2016-12-06 ENCOUNTER — Other Ambulatory Visit: Payer: Self-pay

## 2016-12-06 MED ORDER — GLUCOSE BLOOD VI STRP: ORAL_STRIP | 11 refills | Status: DC

## 2016-12-07 DIAGNOSIS — L918 Other hypertrophic disorders of the skin: Secondary | ICD-10-CM | POA: Diagnosis not present

## 2016-12-15 ENCOUNTER — Telehealth: Payer: Self-pay | Admitting: Family Medicine

## 2016-12-15 NOTE — Telephone Encounter (Signed)
Pt called and stated that he is still having some neck pain and wanted to know if he needs to come in for a visit or just an xray. Please advise, thank you!  Call pt @ [phone]

## 2016-12-18 NOTE — Telephone Encounter (Signed)
Please advise 

## 2016-12-19 NOTE — Telephone Encounter (Signed)
I would suggest reevaluation to determine if x-ray is the first step in evaluating this or if there is need for MRI. Thanks.

## 2016-12-19 NOTE — Telephone Encounter (Signed)
Patient scheduled for appointment.

## 2016-12-20 ENCOUNTER — Ambulatory Visit (INDEPENDENT_AMBULATORY_CARE_PROVIDER_SITE_OTHER): Payer: Medicare Other | Admitting: Family Medicine

## 2016-12-20 ENCOUNTER — Ambulatory Visit (INDEPENDENT_AMBULATORY_CARE_PROVIDER_SITE_OTHER): Payer: Medicare Other

## 2016-12-20 ENCOUNTER — Encounter: Payer: Self-pay | Admitting: Family Medicine

## 2016-12-20 VITALS — BP 136/80 | HR 61 | Temp 97.6°F | Wt 250.4 lb

## 2016-12-20 DIAGNOSIS — M50321 Other cervical disc degeneration at C4-C5 level: Secondary | ICD-10-CM | POA: Diagnosis not present

## 2016-12-20 DIAGNOSIS — M542 Cervicalgia: Secondary | ICD-10-CM | POA: Diagnosis not present

## 2016-12-20 NOTE — Progress Notes (Signed)
  Jesus Rumps, MD Phone: [phone]  Jesus Peterson is a 71 y.o. male who presents today for follow-up.  Patient seen previously for neck pain. Felt to be cervical spine strain. 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 Bothersome if he turns his neck the wrong way. Does not bother him every day. Has not completely gone away. No radiation down his arms. No numbness or weakness. Was having spasms previously though none now. Ibuprofen occasionally as needed is somewhat beneficial. Has not done the exercises previously prescribed.  ROS see history of present illness  Objective  Physical Exam Vitals:   12/20/16 1316  BP: 136/80  Pulse: 61  Temp: 97.6 F (36.4 C)    BP Readings from Last 3 Encounters:  12/20/16 136/80  10/18/16 132/82  06/30/16 138/78   Wt Readings from Last 3 Encounters:  12/20/16 250 lb 6.4 oz (113.6 kg)  10/18/16 254 lb 12.8 oz (115.6 kg)  06/30/16 253 lb (114.8 kg)    Physical Exam  Constitutional: No distress.  Cardiovascular: Normal rate, regular rhythm and normal heart sounds.   Pulmonary/Chest: Effort normal and breath sounds normal.  Musculoskeletal:  No midline neck tenderness, no midline neck step-off, no muscular neck tenderness, no lateral abnormalities in the neck on palpation  Neurological: He is alert.  5/5 strength in bilateral biceps, triceps, grip, quads, hamstrings, plantar and dorsiflexion, sensation to light touch intact in bilateral UE and LE  Skin: He is not diaphoretic.     Assessment/Plan: Please see individual problem list.  Neck pain Suspect osteoarthritis. Continues to have issues with this. We will obtain an x-ray of his neck and refer for physical therapy. Given return precautions.   Orders Placed This Encounter  Procedures  . DG Cervical Spine Complete    Standing Status:   Future    Standing Expiration Date:   02/19/2018    Order Specific Question:   Reason for Exam  (SYMPTOM  OR DIAGNOSIS REQUIRED)    Answer:   persistent neck pain over the past 3 months, no radiation    Order Specific Question:   Preferred imaging location?    Answer:   [facility]  . Ambulatory referral to Physical Therapy    Referral Priority:   Routine    Referral Type:   Physical Medicine    Referral Reason:   Specialty Services Required    Requested Specialty:   Physical Therapy    Number of Visits Requested:   1    Jesus Rumps, MD [place]

## 2016-12-20 NOTE — Assessment & Plan Note (Signed)
Suspect osteoarthritis. Continues to have issues with this. We will obtain an x-ray of his neck and refer for physical therapy. Given return precautions.

## 2016-12-20 NOTE — Progress Notes (Signed)
Pre visit review using our clinic review tool, if applicable. No additional management support is needed unless otherwise documented below in the visit note. 

## 2016-12-20 NOTE — Patient Instructions (Signed)
Nice to see you. We'll get an x-ray today and get the scheduled with physical therapy. If you develop numbness, weakness, tingling, or worsening pain please seek medical attention immediately.

## 2016-12-29 ENCOUNTER — Ambulatory Visit: Payer: Medicare Other | Admitting: Family Medicine

## 2017-01-11 DIAGNOSIS — M62838 Other muscle spasm: Secondary | ICD-10-CM | POA: Diagnosis not present

## 2017-01-11 DIAGNOSIS — M542 Cervicalgia: Secondary | ICD-10-CM | POA: Diagnosis not present

## 2017-01-18 ENCOUNTER — Encounter: Payer: Self-pay | Admitting: Family Medicine

## 2017-01-18 DIAGNOSIS — M62838 Other muscle spasm: Secondary | ICD-10-CM | POA: Diagnosis not present

## 2017-01-18 DIAGNOSIS — M542 Cervicalgia: Secondary | ICD-10-CM | POA: Diagnosis not present

## 2017-01-22 ENCOUNTER — Other Ambulatory Visit: Payer: Self-pay | Admitting: Family Medicine

## 2017-01-22 DIAGNOSIS — M62838 Other muscle spasm: Secondary | ICD-10-CM | POA: Diagnosis not present

## 2017-01-22 DIAGNOSIS — E119 Type 2 diabetes mellitus without complications: Secondary | ICD-10-CM

## 2017-01-22 DIAGNOSIS — M542 Cervicalgia: Secondary | ICD-10-CM | POA: Diagnosis not present

## 2017-01-23 ENCOUNTER — Other Ambulatory Visit: Payer: Self-pay | Admitting: Family Medicine

## 2017-01-23 ENCOUNTER — Ambulatory Visit (INDEPENDENT_AMBULATORY_CARE_PROVIDER_SITE_OTHER): Payer: Medicare Other | Admitting: Podiatry

## 2017-01-23 DIAGNOSIS — L608 Other nail disorders: Secondary | ICD-10-CM

## 2017-01-23 DIAGNOSIS — M79609 Pain in unspecified limb: Secondary | ICD-10-CM

## 2017-01-23 DIAGNOSIS — B351 Tinea unguium: Secondary | ICD-10-CM | POA: Diagnosis not present

## 2017-01-23 DIAGNOSIS — E0843 Diabetes mellitus due to underlying condition with diabetic autonomic (poly)neuropathy: Secondary | ICD-10-CM

## 2017-01-23 DIAGNOSIS — L603 Nail dystrophy: Secondary | ICD-10-CM

## 2017-01-24 MED ORDER — BLOOD GLUCOSE MONITOR KIT: PACK | 0 refills | Status: DC

## 2017-01-24 NOTE — Telephone Encounter (Signed)
faxed

## 2017-01-24 NOTE — Telephone Encounter (Signed)
Please advise 

## 2017-01-24 NOTE — Progress Notes (Signed)
SUBJECTIVE Patient with a history of diabetes mellitus presents to office today complaining of elongated, thickened nails. Pain while ambulating in shoes. Patient is unable to trim their own nails.   Allergies  Allergen Reactions  . Contrast Media [Iodinated Diagnostic Agents]   . Penicillins     OBJECTIVE General Patient is awake, alert, and oriented x 3 and in no acute distress. Derm Skin is dry and supple bilateral. Negative open lesions or macerations. Remaining integument unremarkable. Nails are tender, long, thickened and dystrophic with subungual debris, consistent with onychomycosis, 1-5 bilateral. No signs of infection noted. Vasc  DP and PT pedal pulses palpable bilaterally. Temperature gradient within normal limits.  Neuro Epicritic and protective threshold sensation diminished bilaterally.  Musculoskeletal Exam No symptomatic pedal deformities noted bilateral. Muscular strength within normal limits.  ASSESSMENT 1. Diabetes Mellitus w/ peripheral neuropathy 2. Onychomycosis of nail due to dermatophyte bilateral 3. Pain in foot bilateral  PLAN OF CARE 1. Patient evaluated today. 2. Instructed to maintain good pedal hygiene and foot care. Stressed importance of controlling blood sugar.  3. Mechanical debridement of nails 1-5 bilaterally performed using a nail nipper. Filed with dremel without incident.  4. Return to clinic in 3 mos.     Edrick Kins, DPM [facility]  Dr. Edrick Kins, [address]                                        [place], [place] [address]                Office [phone]  Fax [phone]

## 2017-01-29 ENCOUNTER — Telehealth: Payer: Self-pay

## 2017-01-29 NOTE — Telephone Encounter (Signed)
Refill request from [facility], patient would like a new glucometer

## 2017-01-31 MED ORDER — BLOOD GLUCOSE MONITOR KIT: PACK | 0 refills | Status: DC

## 2017-01-31 NOTE — Telephone Encounter (Signed)
Please fax

## 2017-01-31 NOTE — Telephone Encounter (Signed)
Faxed to [facility]

## 2017-02-05 ENCOUNTER — Encounter: Payer: Self-pay | Admitting: Family Medicine

## 2017-02-05 ENCOUNTER — Ambulatory Visit (INDEPENDENT_AMBULATORY_CARE_PROVIDER_SITE_OTHER): Payer: Medicare Other | Admitting: Family Medicine

## 2017-02-05 ENCOUNTER — Ambulatory Visit (INDEPENDENT_AMBULATORY_CARE_PROVIDER_SITE_OTHER): Payer: Medicare Other

## 2017-02-05 ENCOUNTER — Encounter: Payer: Medicare Other | Attending: Family Medicine | Admitting: Dietician

## 2017-02-05 ENCOUNTER — Encounter: Payer: Self-pay | Admitting: Dietician

## 2017-02-05 VITALS — BP 144/90 | HR 64 | Temp 97.8°F | Wt 247.6 lb

## 2017-02-05 VITALS — BP 154/86 | Ht 70.0 in | Wt 247.2 lb

## 2017-02-05 DIAGNOSIS — I10 Essential (primary) hypertension: Secondary | ICD-10-CM | POA: Diagnosis not present

## 2017-02-05 DIAGNOSIS — M79672 Pain in left foot: Secondary | ICD-10-CM

## 2017-02-05 DIAGNOSIS — E119 Type 2 diabetes mellitus without complications: Secondary | ICD-10-CM

## 2017-02-05 DIAGNOSIS — R197 Diarrhea, unspecified: Secondary | ICD-10-CM | POA: Insufficient documentation

## 2017-02-05 DIAGNOSIS — S99922A Unspecified injury of left foot, initial encounter: Secondary | ICD-10-CM | POA: Diagnosis not present

## 2017-02-05 LAB — POCT GLYCOSYLATED HEMOGLOBIN (HGB A1C): Hemoglobin A1C: 6.7

## 2017-02-05 NOTE — Assessment & Plan Note (Signed)
Above goal today though is controlled at home and he has not taken his medications yet. He will continue to take his medications and continue to check his blood pressure at home.

## 2017-02-05 NOTE — Assessment & Plan Note (Signed)
Improving. Benign exam. Encouraged to continue to monitor. He will stay well hydrated.

## 2017-02-05 NOTE — Assessment & Plan Note (Addendum)
Patient with 1 day of left foot pain with no injury. He is tender over the fifth metatarsal and thus we will obtain an x-ray to evaluate further. If there is a broken bone we'll consider referral to sports medicine or orthopedics. If no broken bone we'll treat as a soft tissue injury.

## 2017-02-05 NOTE — Progress Notes (Signed)
Jesus Rumps, MD Phone: [phone]  Jesus Peterson is a 71 y.o. male who presents today for follow-up.  hypertension: Checking and it is typically 130/80 at home. Taking amlodipine, Coreg, and losartan. No chest pain, shortness breath, or edema. Has not taken his medication yet today.  Diabetes: He did not start the metformin. He has been working on his diet with cutting out flour and sweets. Has also cut down on potatoes. Is not exercising very much. Some polyuria. Saw an ophthalmologist 6 months ago.  Left foot pain: Started in the dorsum of his left foot yesterday. If he puts too much pressure on it its 8 out of 10. No injury. No increased physical activity. No swelling. No history of this.  Diarrhea: Patient notes over the last 3-4 days she's had loose stools. No blood in the stool. No abdominal pain. No vomiting. No fevers. Has eased up to some degree over the last day or so. No sick contacts. No new medications. No changes in food.  PMH: nonsmoker.   ROS see history of present illness  Objective  Physical Exam Vitals:   02/05/17 1533  BP: (!) 144/90  Pulse: 64  Temp: 97.8 F (36.6 C)    BP Readings from Last 3 Encounters:  02/05/17 (!) 144/90  02/05/17 (!) 154/86  12/20/16 136/80   Wt Readings from Last 3 Encounters:  02/05/17 247 lb 9.6 oz (112.3 kg)  02/05/17 247 lb 3.2 oz (112.1 kg)  12/20/16 250 lb 6.4 oz (113.6 kg)    Physical Exam  Constitutional: No distress.  Cardiovascular: Normal rate, regular rhythm and normal heart sounds.   Pulmonary/Chest: Effort normal and breath sounds normal.  Abdominal: Soft. Bowel sounds are normal. He exhibits no distension. There is no tenderness. There is no rebound and no guarding.  Musculoskeletal: He exhibits no edema.  Left foot with tenderness over the length of the fifth metatarsal and fourth metatarsal, no soft tissue swelling noted, no erythema or warmth, no tenderness elsewhere in the foot or ankle, 2+ DP and  PT pulses bilaterally, right foot with no tenderness, warmth, swelling, or erythema  Neurological: He is alert. Gait normal.  Sensational light touch intact bilateral feet  Skin: Skin is warm and dry. He is not diaphoretic.     Assessment/Plan: Please see individual problem list.  Diabetes type 2, controlled Check A1c. He'll work on diet and exercise.  Hypertension Above goal today though is controlled at home and he has not taken his medications yet. He will continue to take his medications and continue to check his blood pressure at home.  Left foot pain Patient with 1 day of left foot pain with no injury. He is tender over the fifth metatarsal and thus we will obtain an x-ray to evaluate further. If there is a broken bone we'll consider referral to sports medicine or orthopedics. If no broken bone we'll treat as a soft tissue injury.  Diarrhea Improving. Benign exam. Encouraged to continue to monitor. He will stay well hydrated.   Orders Placed This Encounter  Procedures  . DG Foot Complete Left    Standing Status:   Future    Number of Occurrences:   1    Standing Expiration Date:   04/07/2018    Order Specific Question:   Reason for Exam (SYMPTOM  OR DIAGNOSIS REQUIRED)    Answer:   left foot pain, onset yesterday, no injury, tender to palpation along the length of the 5th metatarsal    Order  Specific Question:   Preferred imaging location?    Answer:   [facility] Specific Question:   Radiology Contrast Protocol - do NOT remove file path    Answer:   [email or web address]  . POCT HgB A1C    Jesus Rumps, MD [place]

## 2017-02-05 NOTE — Patient Instructions (Signed)
  Check blood sugars 2 x day before breakfast and 2 hrs after lunch every day  Exercise:  Ride exercise bike  for  15  minutes   3  days a week  Avoid sugar sweetened drinks (soda, tea, coffee, sports drinks, juices)  Limit intake of sweets and fried foods  Eat 3 meals day,   1  snacks a day  Space meals 4-6 hours apart  Eat 3 carbohydrate servings/meal protein  Eat 1 carbohydrate serving/snack + protein  Bring blood sugar records to the next appointment/class  Get a [employer] container  Return for appointment/classes on:  02-12-17

## 2017-02-05 NOTE — Patient Instructions (Signed)
Nice to see you. We'll get the x-ray of your foot and will contact you with the results. Please work on dietary changes and try to be active. Please monitor your diarrhea and if it does not continue to improve please let us know. Please take your blood pressure medicines daily.

## 2017-02-05 NOTE — Progress Notes (Signed)
Diabetes Self-Management Education  Visit Type: First/Initial  Appt. Start Time: 1330 Appt. End Time: 1430  02/05/2017  Mr. Jesus Peterson, identified by name and date of birth, is a 71 y.o. male with a diagnosis of Diabetes: Type 2.   ASSESSMENT  Blood pressure (!) 154/86, height 5\' 10"  (1.778 m), weight 247 lb 3.2 oz (112.1 kg). Body mass index is 35.47 kg/m.      Diabetes Self-Management Education - 02/05/17 1448      Visit Information   Visit Type First/Initial     Initial Visit   Diabetes Type Type 2     Health Coping   How would you rate your overall health? Fair     Psychosocial Assessment   Patient Belief/Attitude about Diabetes Motivated to manage diabetes   Self-care barriers None   Self-management support Doctor's office;Family   Other persons present Patient   Patient Concerns Glycemic Control;Weight Control  prevent complications   Special Needs None   Preferred Learning Style Auditory;Visual;Hands on   Learning Readiness Ready   What is the last grade level you completed in school? masters degrees x2     Pre-Education Assessment   Patient understands the diabetes disease and treatment process. Needs Review   Patient understands incorporating nutritional management into lifestyle. Needs Review   Patient undertands incorporating physical activity into lifestyle. Needs Instruction   Patient understands using medications safely. Needs Instruction   Patient understands monitoring blood glucose, interpreting and using results Needs Review   Patient understands prevention, detection, and treatment of acute complications. Needs Review   Patient understands prevention, detection, and treatment of chronic complications. Needs Review   Patient understands how to develop strategies to address psychosocial issues. Needs Instruction   Patient understands how to develop strategies to promote health/change behavior. Needs Review     Complications=  W2H 7.2 (10-18-16)    How often do you check your blood sugar? 1-2 times/day  checks FBG's daily   Fasting Blood glucose range (mg/dL) 130-179;180-200  mostly 160-180's   Have you had a dilated eye exam in the past 12 months? Yes  about 9 months ago   Have you had a dental exam in the past 12 months? Yes  goes every 6 months   Are you checking your feet? Yes   How many days per week are you checking your feet? 7     Dietary Intake   Breakfast --  occasionally skips breakfast; if eats, eats fast foods and time varies due to work   Snack (morning) --  none   Lunch --  eats lunch at 1:30p-usually eats left-overs but if none available, eats a sandwich; limits intake of sweets and eats fried foods 2-3x/wk   Snack (afternoon) --  none   Dinner --  eats light supper at 5p   Snack (evening) --  none   Beverage(s) --  drinks water 4x/day, unsweetened coffee and diet sodas 2-3x/day     Exercise   Exercise Type ADL's     Patient Education   Previous Diabetes Education Yes (please comment)  2 years ago at [employer]  in [place], [place]  Explored patient's options for treatment of their diabetes;Definition of diabetes, type 1 and 2, and the diagnosis of diabetes   Nutrition management  Role of diet in the treatment of diabetes and the relationship between the three main macronutrients and blood glucose level;Food label reading, portion sizes and measuring food.;Carbohydrate counting   Physical activity and  exercise  Role of exercise on diabetes management, blood pressure control and cardiac health.;Helped patient identify appropriate exercises in relation to his/her diabetes, diabetes complications and other health issue.   Medications Reviewed patients medication for diabetes, action, purpose, timing of dose and side effects.   Monitoring Purpose and frequency of SMBG.;Taught/discussed recording of test results and interpretation of SMBG.;Identified appropriate SMBG and/or A1C goals.;Yearly  dilated eye exam   Chronic complications Relationship between chronic complications and blood glucose control;Assessed and discussed foot care and prevention of foot problems;Lipid levels, blood glucose control and heart disease;Dental care;Retinopathy and reason for yearly dilated eye exams;Nephropathy, what it is, prevention of, the use of ACE, ARB's and early detection of through urine microalbumia.;Reviewed with patient heart disease, higher risk of, and prevention   Psychosocial adjustment Role of stress on diabetes   Personal strategies to promote health Lifestyle issues that need to be addressed for better diabetes care;Helped patient develop diabetes management plan for (enter comment)     Outcomes   Expected Outcomes Demonstrated interest in learning. Expect positive outcomes      Individualized Plan for Diabetes Self-Management Training:   Learning Objective:  Patient will have a greater understanding of diabetes self-management. Patient education plan is to attend individual and/or group sessions per assessed needs and concerns.   Plan:   Patient Instructions   Check blood sugars 2 x day before breakfast and 2 hrs after lunch every day  Exercise:  Ride exercise bike  for  15  minutes   3  days a week  Avoid sugar sweetened drinks (soda, tea, coffee, sports drinks, juices)  Limit intake of sweets and fried foods  Eat 3 meals day,   1  snacks a day  Space meals 4-6 hours apart  Eat 3 carbohydrate servings/meal protein  Eat 1 carbohydrate serving/snack + protein  Bring blood sugar records to the next appointment/class  Get a Sharps container  Return for appointment/classes on:  02-12-17   Expected Outcomes:  Demonstrated interest in learning. Expect positive outcomes  Education material provided: General meal planning guidelines  If problems or questions, patient to contact team via: [phone]  Future DSME appointment:  02-12-17

## 2017-02-05 NOTE — Assessment & Plan Note (Signed)
Check A1c. He'll work on diet and exercise.

## 2017-02-05 NOTE — Progress Notes (Signed)
Pre visit review using our clinic review tool, if applicable. No additional management support is needed unless otherwise documented below in the visit note. 

## 2017-02-12 ENCOUNTER — Ambulatory Visit: Payer: Medicare Other

## 2017-02-16 ENCOUNTER — Telehealth: Payer: Self-pay | Admitting: Dietician

## 2017-02-16 NOTE — Telephone Encounter (Signed)
Called patient to reschedule class series, as he missed class 1 on 02/12/17. Unable to leave message because call was dropped.

## 2017-02-19 ENCOUNTER — Ambulatory Visit: Payer: Medicare Other

## 2017-02-26 ENCOUNTER — Ambulatory Visit: Payer: Medicare Other

## 2017-02-27 ENCOUNTER — Telehealth: Payer: Self-pay | Admitting: Dietician

## 2017-02-27 NOTE — Telephone Encounter (Signed)
Called pt-no answer-left message on voice mail for pt to call us to reschedule classes if desired

## 2017-03-26 ENCOUNTER — Encounter: Payer: Self-pay | Admitting: Family Medicine

## 2017-03-26 ENCOUNTER — Ambulatory Visit (INDEPENDENT_AMBULATORY_CARE_PROVIDER_SITE_OTHER): Payer: Medicare Other | Admitting: Family Medicine

## 2017-03-26 VITALS — BP 124/78 | HR 64 | Temp 98.6°F | Ht 71.0 in | Wt 242.2 lb

## 2017-03-26 DIAGNOSIS — R350 Frequency of micturition: Secondary | ICD-10-CM

## 2017-03-26 DIAGNOSIS — H9193 Unspecified hearing loss, bilateral: Secondary | ICD-10-CM

## 2017-03-26 DIAGNOSIS — E119 Type 2 diabetes mellitus without complications: Secondary | ICD-10-CM

## 2017-03-26 DIAGNOSIS — N529 Male erectile dysfunction, unspecified: Secondary | ICD-10-CM

## 2017-03-26 DIAGNOSIS — Z0001 Encounter for general adult medical examination with abnormal findings: Secondary | ICD-10-CM | POA: Insufficient documentation

## 2017-03-26 LAB — COMPREHENSIVE METABOLIC PANEL
ALT: 21 U/L (ref 0–53)
AST: 19 U/L (ref 0–37)
Albumin: 4.4 g/dL (ref 3.5–5.2)
Alkaline Phosphatase: 64 U/L (ref 39–117)
BUN: 25 mg/dL — ABNORMAL HIGH (ref 6–23)
CO2: 25 mEq/L (ref 19–32)
Calcium: 9.5 mg/dL (ref 8.4–10.5)
Chloride: 104 mEq/L (ref 96–112)
Creatinine, Ser: 1.15 mg/dL (ref 0.40–1.50)
GFR: 66.44 mL/min (ref >60.00)
Glucose, Bld: 115 mg/dL — ABNORMAL HIGH (ref 70–99)
Potassium: 4.5 mEq/L (ref 3.5–5.1)
Sodium: 138 mEq/L (ref 135–145)
Total Bilirubin: 1 mg/dL (ref 0.2–1.2)
Total Protein: 7 g/dL (ref 6.0–8.3)

## 2017-03-26 MED ORDER — TETANUS-DIPHTH-ACELL PERTUSSIS 5-2.5-18.5 LF-MCG/0.5 IM SUSP: 0.5000 mL | Freq: Once | INTRAMUSCULAR | 0 refills | Status: AC

## 2017-03-26 NOTE — Assessment & Plan Note (Signed)
Recent A1c better controlled. With frequent urination we'll check a UA. He is also due for urine microalbumin.

## 2017-03-26 NOTE — Assessment & Plan Note (Addendum)
Refer to urology to determine best option for treatment .

## 2017-03-26 NOTE — Progress Notes (Signed)
[name], MD Phone: [phone]  Jesus Peterson is a 71 y.o. male who presents today for physical exam.  Exercises by walking and riding his bike. He's been working significantly on his diet. His blood sugars have come down some. Up-to-date on colonoscopy and pneumonia vaccine. Up-to-date on hepatitis C. Not up-to-date on tetanus vaccination.  Did have a PSA checked earlier this year that was in the normal range. No tobacco use, alcohol use, or illicit drug use.  He does report some frequent urination. He does drink a lot of fluids though this is not related to polydipsia. He notes no urinary stream issues. No dysuria. Notes his sugars have been running improved. He sees ophthalmology next week. Note some gradual vision changes though nothing acute.  He notes some gradual hearing difficulty. Tinnitus for the last 50-60 years. He's never been evaluated.  Erectile dysfunction: Notes this has been going on for several years. He's been thinking about doing the insertion medication. His neurologist previously told him not to take Viagra.  Active Ambulatory Problems    Diagnosis Date Noted  . Hypertension 03/04/2012  . Erectile dysfunction 03/04/2012  . Hyperlipidemia 03/04/2012  . GERD (gastroesophageal reflux disease) 04/05/2012  . Diabetes type 2, controlled ([facility]) 08/11/2013  . Nonspecific abnormal electrocardiogram (ECG) (EKG) 02/06/2014  . Obstructive sleep apnea 07/03/2014  . Osteoarthritis of left hand 06/30/2016  . Angioma 06/30/2016  . Neck pain 10/18/2016  . Left foot pain 02/05/2017  . Diarrhea 02/05/2017  . Encounter for general adult medical examination with abnormal findings 03/26/2017  . Hearing difficulty of both ears 03/26/2017   Resolved Ambulatory Problems    Diagnosis Date Noted  . Hyperglycemia 05/03/2012  . Daytime somnolence 08/02/2012  . Sleep apnea 11/08/2012  . Urinary frequency 11/08/2012  . URI (upper respiratory infection) 12/11/2012  .  Dental abscess 08/07/2013  . Anxiety state, unspecified 08/29/2013  . Right shoulder pain 01/09/2014  . Temperature intolerance 01/09/2014  . Medicare annual wellness visit, subsequent 02/06/2014  . Flank pain 10/30/2014  . Screening for prostate cancer 08/13/2015   Past Medical History:  Diagnosis Date  . Erectile dysfunction   . Hyperlipidemia   . Hypertension   . Kidney stone   . Seborrheic dermatitis   . Treadmill stress test negative for angina pectoris 2013  . Venous angioma of brain ([facility])     Family History  Problem Relation Age of Onset  . Stroke Mother   . Heart disease Mother        s/p CABG  . Heart disease Father   . Heart disease Brother        s/p CABG  . Heart disease Brother        s/p angioplasty  . Arthritis Brother     Social History   Social History  . Marital status: Married    Spouse name: N/A  . Number of children: N/A  . Years of education: N/A   Occupational History  . Not on file.   Social History Main Topics  . Smoking status: Never Smoker  . Smokeless tobacco: Never Used  . Alcohol use No  . Drug use: Unknown  . Sexual activity: Not on file   Other Topics Concern  . Not on file   Social History Narrative   Lives in [place] with wife, 44years. Children - son 39 and daughter 19.   Work - Scientist, research (physical sciences)   Diet - regular   Exercise - limited, walks occas    ROS  General:  Negative for nexplained weight loss, fever Skin: Negative for new or changing mole, sore that won't heal HEENT: Positive for trouble hearing, trouble seeing, ringing in ears, negative for mouth sores, hoarseness, change in voice, dysphagia. CV:  Negative for chest pain, dyspnea, edema, palpitations Resp: Negative for cough, dyspnea, hemoptysis GI: Negative for nausea, vomiting, diarrhea, constipation, abdominal pain, melena, hematochezia. GU: Positive for polyuria and sexual difficulty, Negative for dysuria, incontinence, urinary hesitance, hematuria, vaginal or penile  discharge, lumps in testicle or breasts MSK: Negative for muscle cramps or aches, joint pain or swelling Neuro: Negative for headaches, weakness, numbness, dizziness, passing out/fainting Psych: Negative for depression, anxiety, memory problems  Objective  Physical Exam Vitals:   03/26/17 1325  BP: 124/78  Pulse: 64  Temp: 98.6 F (37 C)    BP Readings from Last 3 Encounters:  03/26/17 124/78  02/05/17 (!) 144/90  02/05/17 (!) 154/86   Wt Readings from Last 3 Encounters:  03/26/17 242 lb 3.2 oz (109.9 kg)  02/05/17 247 lb 9.6 oz (112.3 kg)  02/05/17 247 lb 3.2 oz (112.1 kg)    Physical Exam  Constitutional: No distress.  HENT:  Head: Normocephalic and atraumatic.  Mouth/Throat: Oropharynx is clear and moist. No oropharyngeal exudate.  Eyes: Conjunctivae are normal. Pupils are equal, round, and reactive to light.  Cardiovascular: Normal rate, regular rhythm and normal heart sounds.   Pulmonary/Chest: Effort normal and breath sounds normal.  Abdominal: Soft. Bowel sounds are normal. He exhibits no distension. There is no tenderness. There is no rebound and no guarding.  Genitourinary: Prostate normal.  Genitourinary Comments: 2 small hemorrhoids that are noninflamed noted  Musculoskeletal: He exhibits no edema.  Neurological: He is alert. Gait normal.  Skin: Skin is warm and dry. He is not diaphoretic.  Psychiatric: Mood and affect normal.     Assessment/Plan:   Encounter for general adult medical examination with abnormal findings Physical exam completed. He'll continue to work on diet and exercise. Most recent PSA through the [facility] was reviewed. CMP today. Prescription for tetanus vaccination given to patient.  Erectile dysfunction Refer to urology to determine best option for treatment .  Diabetes type 2, controlled Recent A1c better controlled. With frequent urination we'll check a UA. He is also due for urine microalbumin.  Hearing difficulty of both  ears Refer to audiology for evaluation.   Orders Placed This Encounter  Procedures  . Comp Met (CMET)  . Microalbumin / creatinine urine ratio    Standing Status:   Future    Standing Expiration Date:   04/25/2017  . Ambulatory referral to Urology    Referral Priority:   Routine    Referral Type:   Consultation    Referral Reason:   Specialty Services Required    Requested Specialty:   Urology    Number of Visits Requested:   1  . Ambulatory referral to Audiology    Referral Priority:   Routine    Referral Type:   Audiology Exam    Referral Reason:   Specialty Services Required    Number of Visits Requested:   1  . POCT urinalysis dipstick    Standing Status:   Future    Standing Expiration Date:   04/25/2017    Meds ordered this encounter  Medications  . Tdap (BOOSTRIX) 5-2.5-18.5 LF-MCG/0.5 injection    Sig: Inject 0.5 mLs into the muscle once.    Dispense:  0.5 mL    Refill:  0  [name], MD [place]

## 2017-03-26 NOTE — Assessment & Plan Note (Signed)
Refer to audiology for evaluation.

## 2017-03-26 NOTE — Assessment & Plan Note (Signed)
Physical exam completed. He'll continue to work on diet and exercise. Most recent PSA through the [facility] was reviewed. CMP today. Prescription for tetanus vaccination given to patient.

## 2017-03-26 NOTE — Patient Instructions (Signed)
Nice to see you. Please continue to work on diet and exercise. We'll check your urine and some lab work. We will get you referred to urology. Please get the tetanus vaccination at her convenience.

## 2017-03-27 ENCOUNTER — Encounter: Payer: Self-pay | Admitting: Dietician

## 2017-03-27 ENCOUNTER — Other Ambulatory Visit (INDEPENDENT_AMBULATORY_CARE_PROVIDER_SITE_OTHER): Payer: Medicare Other

## 2017-03-27 DIAGNOSIS — R350 Frequency of micturition: Secondary | ICD-10-CM | POA: Diagnosis not present

## 2017-03-27 DIAGNOSIS — E119 Type 2 diabetes mellitus without complications: Secondary | ICD-10-CM | POA: Diagnosis not present

## 2017-03-27 LAB — POCT URINALYSIS DIP (MANUAL ENTRY)
Bilirubin, UA: NEGATIVE
Blood, UA: NEGATIVE
Glucose, UA: NEGATIVE mg/dL
Leukocytes, UA: NEGATIVE
Nitrite, UA: NEGATIVE
Protein Ur, POC: NEGATIVE mg/dL
Spec Grav, UA: 1.025 (ref 1.010–1.025)
Urobilinogen, UA: 1 E.U./dL
pH, UA: 6 (ref 5.0–8.0)

## 2017-03-27 NOTE — Addendum Note (Signed)
Addended by: Arby Barrette on: 03/27/2017 04:42 PM   Modules accepted: Orders

## 2017-03-28 LAB — MICROALBUMIN / CREATININE URINE RATIO
Creatinine,U: 232.1 mg/dL
Microalb Creat Ratio: 0.7 mg/g (ref 0.0–30.0)
Microalb, Ur: 1.6 mg/dL (ref 0.0–1.9)

## 2017-03-29 ENCOUNTER — Telehealth: Payer: Self-pay | Admitting: Family Medicine

## 2017-04-12 ENCOUNTER — Other Ambulatory Visit: Payer: Self-pay | Admitting: Family Medicine

## 2017-04-16 ENCOUNTER — Ambulatory Visit: Payer: Self-pay | Admitting: Urology

## 2017-04-19 DIAGNOSIS — H903 Sensorineural hearing loss, bilateral: Secondary | ICD-10-CM | POA: Diagnosis not present

## 2017-04-19 DIAGNOSIS — D1030 Benign neoplasm of unspecified part of mouth: Secondary | ICD-10-CM | POA: Diagnosis not present

## 2017-04-24 ENCOUNTER — Ambulatory Visit: Payer: Medicare Other | Admitting: Podiatry

## 2017-04-30 ENCOUNTER — Ambulatory Visit: Payer: Self-pay

## 2017-05-04 ENCOUNTER — Ambulatory Visit (INDEPENDENT_AMBULATORY_CARE_PROVIDER_SITE_OTHER): Payer: Medicare Other | Admitting: Podiatry

## 2017-05-04 ENCOUNTER — Encounter: Payer: Self-pay | Admitting: Podiatry

## 2017-05-04 DIAGNOSIS — B351 Tinea unguium: Secondary | ICD-10-CM

## 2017-05-04 DIAGNOSIS — M79609 Pain in unspecified limb: Secondary | ICD-10-CM

## 2017-05-07 NOTE — Progress Notes (Signed)
Patient ID: Jesus Peterson, male   DOB: 01/13/1946, 71 y.o.   MRN: [id number]  Subjective: 71 y.o. returns the office today for painful, elongated, thickened toenails which he is unable to trim himself. Denies any redness or drainage around the nails. Denies any acute changes since last appointment and no new complaints today. Denies any systemic complaints such as fevers, chills, nausea, vomiting.   Objective: AAO 3, NAD DP/PT pulses palpable, CRT less than 3 seconds; pedal hair present Nails hypertrophic, dystrophic, elongated, brittle, discolored 10.  There is incurvation of the nails particular the hallux bilaterally left > right.There is tenderness overlying the nails 1-5 bilaterally. There is no surrounding erythema or drainage along the nail sites. No open lesions or pre-ulcerative lesions are identified. No other areas of tenderness bilateral lower extremities. No overlying edema, erythema, increased warmth. No pain with calf compression, swelling, warmth, erythema.  Assessment: Patient presents with symptomatic onychomycosis/ingrown toenails  Plan: -Treatment options including alternatives, risks, complications were discussed -Nails sharply debrided 10 without complication/bleeding. -Discussed daily foot inspection. If there are any changes, to call the office immediately.  -Follow-up in 3 months or sooner if any problems are to arise. In the meantime, encouraged to call the office with any questions, concerns, changes symptoms.  Caralynn Gelber, DPM  

## 2017-05-22 ENCOUNTER — Telehealth: Payer: Self-pay | Admitting: Family Medicine

## 2017-05-22 NOTE — Telephone Encounter (Signed)
Left pt message asking to call Ebony Hail back directly at [phone] to schedule AWV. Thanks!  *NOTE* Last AWV 02/11/16

## 2017-06-08 ENCOUNTER — Other Ambulatory Visit: Payer: Self-pay | Admitting: Family Medicine

## 2017-06-08 DIAGNOSIS — H35033 Hypertensive retinopathy, bilateral: Secondary | ICD-10-CM | POA: Diagnosis not present

## 2017-06-08 DIAGNOSIS — H43813 Vitreous degeneration, bilateral: Secondary | ICD-10-CM | POA: Diagnosis not present

## 2017-06-08 DIAGNOSIS — H5203 Hypermetropia, bilateral: Secondary | ICD-10-CM | POA: Diagnosis not present

## 2017-06-08 DIAGNOSIS — I10 Essential (primary) hypertension: Secondary | ICD-10-CM | POA: Diagnosis not present

## 2017-06-08 DIAGNOSIS — H25813 Combined forms of age-related cataract, bilateral: Secondary | ICD-10-CM | POA: Diagnosis not present

## 2017-06-20 DIAGNOSIS — L980 Pyogenic granuloma: Secondary | ICD-10-CM | POA: Diagnosis not present

## 2017-06-26 ENCOUNTER — Ambulatory Visit (INDEPENDENT_AMBULATORY_CARE_PROVIDER_SITE_OTHER): Payer: Medicare Other

## 2017-06-26 ENCOUNTER — Encounter: Payer: Self-pay | Admitting: Family Medicine

## 2017-06-26 ENCOUNTER — Ambulatory Visit (INDEPENDENT_AMBULATORY_CARE_PROVIDER_SITE_OTHER): Payer: Medicare Other | Admitting: Family Medicine

## 2017-06-26 VITALS — BP 120/64 | HR 60 | Temp 98.6°F | Ht 70.0 in | Wt 245.0 lb

## 2017-06-26 VITALS — BP 120/64 | HR 60 | Temp 98.6°F | Resp 14 | Ht 70.0 in | Wt 245.0 lb

## 2017-06-26 DIAGNOSIS — E119 Type 2 diabetes mellitus without complications: Secondary | ICD-10-CM | POA: Diagnosis not present

## 2017-06-26 DIAGNOSIS — Z0000 Encounter for general adult medical examination without abnormal findings: Secondary | ICD-10-CM | POA: Diagnosis not present

## 2017-06-26 DIAGNOSIS — Z1331 Encounter for screening for depression: Secondary | ICD-10-CM

## 2017-06-26 DIAGNOSIS — G4733 Obstructive sleep apnea (adult) (pediatric): Secondary | ICD-10-CM

## 2017-06-26 DIAGNOSIS — I10 Essential (primary) hypertension: Secondary | ICD-10-CM | POA: Diagnosis not present

## 2017-06-26 DIAGNOSIS — L608 Other nail disorders: Secondary | ICD-10-CM | POA: Diagnosis not present

## 2017-06-26 DIAGNOSIS — Z1389 Encounter for screening for other disorder: Secondary | ICD-10-CM

## 2017-06-26 LAB — POCT GLYCOSYLATED HEMOGLOBIN (HGB A1C): Hemoglobin A1C: 6.5

## 2017-06-26 NOTE — Patient Instructions (Signed)
Nice to see you. Please check your blood pressure daily at home after resting for 10-15 minutes and call us in 2 weeks with your numbers. Please see your podiatrist for your toe. We will contact you with your A1c result.

## 2017-06-26 NOTE — Assessment & Plan Note (Signed)
Well-controlled here though sounds to be slightly above goal at home. He is not checking very frequently. He will check daily for the next 2 weeks and then call us with the results. We'll base changes off of those readings.

## 2017-06-26 NOTE — Assessment & Plan Note (Signed)
Compliant with CPAP. He'll continue this.

## 2017-06-26 NOTE — Assessment & Plan Note (Signed)
Patient with onychomycosis bilaterally and slight ingrowing of left great toenail. No signs of infection. Discussed seeing his podiatrist. If he develops signs of infection he will let us know.

## 2017-06-26 NOTE — Patient Instructions (Addendum)
  Jesus Peterson , Thank you for taking time to come for your Medicare Wellness Visit. I appreciate your ongoing commitment to your health goals. Please review the following plan we discussed and let me know if I can assist you in the future.   Follow up with Dr. [name] as needed.    Bring a copy of your [place] and/or Living Will to be scanned into chart.  Have a great day!  These are the goals we discussed: Goals    . Peak Blood Glucose < 180    . Weight < 200 lb (90.719 kg)          Portion control Low carb       This is a list of the screening recommended for you and due dates:  Health Maintenance  Topic Date Due  . Tetanus Vaccine  04/02/1964  . Eye exam for diabetics  09/08/2014  . Flu Shot  06/26/2018*  . Hemoglobin A1C  08/07/2017  . Complete foot exam   03/26/2018  . Colon Cancer Screening  03/04/2020  .  Hepatitis C: One time screening is recommended by Center for Disease Control  (CDC) for  adults born from [birth year] through 1965.   Completed  . Pneumonia vaccines  Completed  *Topic was postponed. The date shown is not the original due date.

## 2017-06-26 NOTE — Assessment & Plan Note (Signed)
Check A1c today.

## 2017-06-26 NOTE — Progress Notes (Signed)
Subjective:   Jesus Peterson is a 71 y.o. male who presents for Medicare Annual (Subsequent) preventive examination.  Review of Systems:  No ROS.  Medicare Wellness Visit. Additional risk factors are reflected in the social history.  Cardiac Risk Factors include: advanced age (>84mn, >>76women);male gender;hypertension;diabetes mellitus;obesity (BMI >30kg/m2)     Objective:     Vitals: BP 120/64 (BP Location: Left Arm, Patient Position: Sitting, Cuff Size: Normal)   Pulse 60   Temp 98.6 F (37 C) (Oral)   Resp 14   Ht '5\' 10"'  (1.778 m)   Wt 245 lb (111.1 kg)   SpO2 97%   BMI 35.15 kg/m   Body mass index is 35.15 kg/m.   Tobacco History  Smoking Status  . Never Smoker  Smokeless Tobacco  . Never Used     Counseling given: Not Answered   Past Medical History:  Diagnosis Date  . Erectile dysfunction   . Hyperlipidemia   . Hypertension   . Kidney stone    several  . Seborrheic dermatitis   . Treadmill stress test negative for angina pectoris 2013   Dr. KNehemiah Massed normal per pt  . [place], found 2/2 tinnitus, followed on MRI   History reviewed. No pertinent surgical history. Family History  Problem Relation Age of Onset  . Stroke Mother   . Heart disease Mother        s/p CABG  . Heart disease Father   . Heart disease Brother        s/p CABG  . Heart disease Brother        s/p angioplasty  . Arthritis Brother    History  Sexual Activity  . Sexual activity: Not on file    Outpatient Encounter Prescriptions as of 06/26/2017  Medication Sig  . ALPRAZolam (XANAX) 0.5 MG tablet Take 1 tablet 30-625mutes prior to procedure, then you may repeat x 1 at time of procedure.  . Marland KitchenmLODipine (NORVASC) 10 MG tablet TAKE 1 TABLET (10 MG TOTAL) BY MOUTH DAILY.  . Marland Kitchenspirin 81 MG tablet Take 81 mg by mouth daily.  . Marland KitchenAYER MICROLET LANCETS lancets USE  TWICE DAILY ICD 10 E11.9  . blood glucose meter kit and supplies KIT [employer]. Use two  times daily as directed. E11.9  . carvedilol (COREG) 12.5 MG tablet TAKE 1 TABLET TWICE DAILY WITH MEALS  . GARLIC OIL PO Take 1 capsule by mouth 4 (four) times daily.  . Marland Kitchenlucose blood (BAYER CONTOUR NEXT TEST) test strip USE  STRIP TO CHECK GLUCOSE TWICE DAILY E11.9  . hydrocortisone 2.5 % cream Apply 1 application topically 2 (two) times daily.  . Jerrye Beaversolysacch (ALCORTIN A) 1-2-1 % GEL Apply 1 application topically daily.  . Marland Kitchenetoconazole (NIZORAL) 2 % cream Apply 1 application topically 2 (two) times daily.  . Marland Kitchenosartan (COZAAR) 50 MG tablet Take 1 tablet (50 mg total) by mouth daily.  . metFORMIN (GLUCOPHAGE) 500 MG tablet Take 1 tablet (500 mg total) by mouth 2 (two) times daily with a meal.  . simvastatin (ZOCOR) 40 MG tablet Take 0.5 tablets (20 mg total) by mouth every evening.   No facility-administered encounter medications on file as of 06/26/2017.     Activities of Daily Living In your present state of health, do you have any difficulty performing the following activities: 06/26/2017  Hearing? Y  Vision? N  Difficulty concentrating or making decisions? N  Walking or climbing stairs? N  Dressing  or bathing? N  Doing errands, shopping? N  Preparing Food and eating ? N  Using the Toilet? N  In the past six months, have you accidently leaked urine? N  Do you have problems with loss of bowel control? N  Managing your Medications? N  Managing your Finances? N  Housekeeping or managing your Housekeeping? N  Some recent data might be hidden    Patient Care Team: Jesus Haven, MD as PCP - General (Family Medicine) Corey Skains, MD as Referring Physician (Internal Medicine) Manya Silvas, MD (Gastroenterology) Oneta Rack, MD (Dermatology)    Assessment:    This is a routine wellness examination for Jesus Peterson. The goal of the wellness visit is to assist the patient how to close the gaps in care and create a preventative care plan for the  patient.   The roster of all physicians providing medical care to patient is listed in the Snapshot section of the chart.  Taking calcium VIT D as appropriate/Osteoporosis risk reviewed.    Safety issues reviewed; medical alert bracelet, smoke and carbon monoxide detectors in the home. No firearms in the home.  Wears seatbelts when driving or riding with others. Patient does wear sunscreen or protective clothing when in direct sunlight. No violence in the home.  Depression- PHQ 2 &9 complete.  No signs/symptoms or verbal communication regarding little pleasure in doing things, feeling down, depressed or hopeless. No changes in sleeping, energy, eating, concentrating.  No thoughts of self harm or harm towards others.  Time spent on this topic is 8 minutes.   Patient is alert, normal appearance, oriented to person/place/and time.  Correctly identified the president of the [place], recall of 3/3 words, and performing simple calculations. Displays appropriate judgement and can read correct time from watch face.   No new identified risk were noted.  No failures at ADL's or IADL's.    BMI- discussed the importance of a healthy diet, water intake and the benefits of aerobic exercise. Educational material provided.   24 hour diet recall: Breakfast: steak, hash brown, egg Lunch: chicken noodle soup Dinner: spaghetti Snack:  3 peanut butter nabs Daily fluid intake: 1 cups of caffeine, 4 cups of water  Dental- every 6 months. [place]- Visual acuity not assessed per patient preference since they have regular follow up with the ophthalmologist.  Wears corrective lenses.  Sleep patterns- Sleeps 6-8 hours at night.  Wakes feeling rested. CPAP in use.  TDAP vaccine deferred per patient preference.  Follow up with insurance.  Educational material provided.  Patient Concerns: None at this time. Follow up with PCP as needed.  Exercise Activities and Dietary recommendations Current  Exercise Habits: Home exercise routine, Type of exercise: calisthenics, Frequency (Times/Week): 2, Intensity: Mild  Goals    . Peak Blood Glucose < 180    . Weight < 200 lb (90.719 kg)          Portion control Low carb      Fall Risk Fall Risk  06/26/2017 02/05/2017 02/11/2016 02/12/2015 02/06/2014  Falls in the past year? No No No No No   Depression Screen PHQ 2/9 Scores 06/26/2017 02/05/2017 02/11/2016 02/12/2015  PHQ - 2 Score 0 0 0 1  PHQ- 9 Score 0 - - -     Cognitive Function MMSE - Mini Mental State Exam 02/12/2015  Orientation to time 5  Orientation to Place 5  Registration 3  Attention/ Calculation 5  Recall 3  Language- name 2  objects 2  Language- repeat 1  Language- follow 3 step command 3  Language- read & follow direction 1  Write a sentence 1  Copy design 1  Total score 30     6CIT Screen 06/26/2017  What Year? 0 points  What month? 0 points  What time? 0 points  Count back from 20 0 points  Months in reverse 0 points  Repeat phrase 0 points  Total Score 0    Immunization History  Administered Date(s) Administered  . Influenza Split 03/05/2011, 08/02/2012, 07/30/2014  . Influenza-Unspecified 08/06/2015, 08/16/2016  . Pneumococcal Conjugate-13 11/07/2013  . Pneumococcal Polysaccharide-23 03/05/2011   Screening Tests Health Maintenance  Topic Date Due  . TETANUS/TDAP  04/02/1964  . OPHTHALMOLOGY EXAM  09/08/2014  . INFLUENZA VACCINE  06/26/2018 (Originally 05/16/2017)  . HEMOGLOBIN A1C  08/07/2017  . FOOT EXAM  03/26/2018  . COLONOSCOPY  03/04/2020  . Hepatitis C Screening  Completed  . PNA vac Low Risk Adult  Completed      Plan:    End of life planning; Advance aging; Advanced directives discussed. Copy of current HCPOA/Living Will requested.    I have personally reviewed and noted the following in the patient's chart:   . Medical and social history . Use of alcohol, tobacco or illicit drugs  . Current medications and  supplements . Functional ability and status . Nutritional status . Physical activity . Advanced directives . List of other physicians . Hospitalizations, surgeries, and ER visits in previous 12 months . Vitals . Screenings to include cognitive, depression, and falls . Referrals and appointments  In addition, I have reviewed and discussed with patient certain preventive protocols, quality metrics, and best practice recommendations. A written personalized care plan for preventive services as well as general preventive health recommendations were provided to patient.     Varney Biles, LPN  2/48/1859

## 2017-06-26 NOTE — Progress Notes (Signed)
  [name], MD Phone: [phone]  Jesus Peterson is a 71 y.o. male who presents today for f/u.  DIABETES Disease Monitoring: Blood Sugar ranges-160-170 Polyuria/phagia/dipsia- no      Visual problems- no Diet controlled.  HYPERTENSION  Disease Monitoring  Home BP Monitoring 130-150/80, though does not check often Chest pain- no    Dyspnea- no Medications  Compliance-  Taking amlodipine, carvedilol, losartan.  Edema- no  OSA: Using his CPAP nightly for 6 hours. He does wake up well rested. No hypersomnia during the day.  Patient follows with podiatry for his toenails. He does note occasional ingrown toenails that they trim back. They've never removed the toenail. He notes his left great toe is starting to get where it's ingrowing. There is not any erythema or drainage. Minimal discomfort.  PMH: nonsmoker.   ROS see history of present illness  Objective  Physical Exam Vitals:   06/26/17 1358  BP: 120/64  Pulse: 60  Temp: 98.6 F (37 C)  SpO2: 97%    BP Readings from Last 3 Encounters:  06/26/17 120/64  06/26/17 120/64  03/26/17 124/78   Wt Readings from Last 3 Encounters:  06/26/17 245 lb (111.1 kg)  06/26/17 245 lb (111.1 kg)  03/26/17 242 lb 3.2 oz (109.9 kg)    Physical Exam  Constitutional: No distress.  Cardiovascular: Normal rate, regular rhythm and normal heart sounds.   Pulmonary/Chest: Effort normal and breath sounds normal.  Neurological: He is alert. Gait normal.  Skin: Skin is warm and dry. He is not diaphoretic.   Diabetic Foot Exam - Simple   Simple Foot Form Diabetic Foot exam was performed with the following findings:  Yes 06/26/2017  2:29 PM  Visual Inspection See comments:  Yes Sensation Testing Intact to touch and monofilament testing bilaterally:  Yes Pulse Check Posterior Tibialis and Dorsalis pulse intact bilaterally:  Yes Comments No ulcerations or skin breakdown, bilateral toes with some onychomycosis, left great toe with  slight ingrowing of toenail edges, no erythema or tenderness or drainage     Assessment/Plan: Please see individual problem list.  Obstructive sleep apnea Compliant with CPAP. He'll continue this.  Hypertension Well-controlled here though sounds to be slightly above goal at home. He is not checking very frequently. He will check daily for the next 2 weeks and then call us with the results. We'll base changes off of those readings.  Diabetes type 2, controlled Check A1c today.  Toenail deformity Patient with onychomycosis bilaterally and slight ingrowing of left great toenail. No signs of infection. Discussed seeing his podiatrist. If he develops signs of infection he will let us know.   Orders Placed This Encounter  Procedures  . POCT HgB A1C    [name], MD [place]

## 2017-06-27 NOTE — Telephone Encounter (Signed)
Completed 06/26/17

## 2017-07-09 ENCOUNTER — Other Ambulatory Visit: Payer: Self-pay

## 2017-07-09 MED ORDER — LOSARTAN POTASSIUM 50 MG PO TABS: 50.0000 mg | ORAL_TABLET | Freq: Every day | ORAL | 3 refills | Status: DC

## 2017-07-09 NOTE — Telephone Encounter (Signed)
Last OV 06/26/17 last filled by Dr.Walker 03/03/16  90 3rf

## 2017-07-17 DIAGNOSIS — L980 Pyogenic granuloma: Secondary | ICD-10-CM | POA: Diagnosis not present

## 2017-07-20 ENCOUNTER — Ambulatory Visit (INDEPENDENT_AMBULATORY_CARE_PROVIDER_SITE_OTHER): Payer: Medicare Other | Admitting: *Deleted

## 2017-07-20 DIAGNOSIS — Z23 Encounter for immunization: Secondary | ICD-10-CM | POA: Diagnosis not present

## 2017-07-21 ENCOUNTER — Telehealth: Payer: Self-pay | Admitting: Family Medicine

## 2017-07-21 DIAGNOSIS — I10 Essential (primary) hypertension: Secondary | ICD-10-CM

## 2017-07-21 NOTE — Telephone Encounter (Signed)
Please let the patient know his blood pressure is slightly above goal given that he has diabetes. I would like to increase his losartan to 100 mg daily. We'll need to recheck lab work in 1 week after increasing it. Thanks.

## 2017-07-23 NOTE — Telephone Encounter (Signed)
Left message to return call 

## 2017-07-23 NOTE — Telephone Encounter (Signed)
Patient notified and would like rx to be sent to mail order pharmacy, patient is scheduled for lab in two weeks please place order

## 2017-07-24 DIAGNOSIS — K136 Irritative hyperplasia of oral mucosa: Secondary | ICD-10-CM | POA: Diagnosis not present

## 2017-07-25 MED ORDER — LOSARTAN POTASSIUM 100 MG PO TABS
100.0000 mg | ORAL_TABLET | Freq: Every day | ORAL | 3 refills | Status: DC
Start: 2017-07-25 — End: 2018-09-05

## 2017-07-25 NOTE — Telephone Encounter (Signed)
Sent to mail order pharmacy. Lab order placed. Patient can take two 50 mg tablets of his current losartan daily until he receives his new prescription.

## 2017-07-25 NOTE — Addendum Note (Signed)
Addended by: [name], Esai Stecklein G on: 07/25/2017 05:19 PM   Modules accepted: Orders

## 2017-07-26 NOTE — Telephone Encounter (Signed)
Left message to return call 

## 2017-07-26 NOTE — Telephone Encounter (Signed)
Patient notified

## 2017-08-03 ENCOUNTER — Ambulatory Visit (INDEPENDENT_AMBULATORY_CARE_PROVIDER_SITE_OTHER): Payer: Medicare Other | Admitting: Podiatry

## 2017-08-03 ENCOUNTER — Encounter: Payer: Self-pay | Admitting: Podiatry

## 2017-08-03 DIAGNOSIS — M79609 Pain in unspecified limb: Secondary | ICD-10-CM

## 2017-08-03 DIAGNOSIS — B351 Tinea unguium: Secondary | ICD-10-CM | POA: Diagnosis not present

## 2017-08-03 NOTE — Progress Notes (Signed)
Patient ID: Jesus Peterson, male   DOB: July 29, 1946, 71 y.o.   MRN: [id number]  Subjective: 71 y.o. returns the office today for painful, elongated, thickened toenails which he is unable to trim himself. Denies any redness or drainage around the nails. Denies any acute changes since last appointment and no new complaints today. Denies any systemic complaints such as fevers, chills, nausea, vomiting.   Objective: AAO 3, NAD DP/PT pulses palpable, CRT less than 3 seconds; pedal hair present Nails hypertrophic, dystrophic, elongated, brittle, discolored 10.  There is incurvation of the nails particular the hallux bilaterally left > right.There is tenderness overlying the nails 1-5 bilaterally. There is no surrounding erythema or drainage along the nail sites. No open lesions or pre-ulcerative lesions are identified. No other areas of tenderness bilateral lower extremities. No overlying edema, erythema, increased warmth. No pain with calf compression, swelling, warmth, erythema.  Assessment: Patient presents with symptomatic onychomycosis/ingrown toenails  Plan: -Treatment options including alternatives, risks, complications were discussed -Nails sharply debrided 10 without complication/bleeding. -Discussed daily foot inspection. If there are any changes, to call the office immediately.  -Follow-up in 3 months or sooner if any problems are to arise. In the meantime, encouraged to call the office with any questions, concerns, changes symptoms.  Celesta Gentile, DPM

## 2017-08-06 ENCOUNTER — Other Ambulatory Visit (INDEPENDENT_AMBULATORY_CARE_PROVIDER_SITE_OTHER): Payer: Medicare Other

## 2017-08-06 DIAGNOSIS — I10 Essential (primary) hypertension: Secondary | ICD-10-CM

## 2017-08-07 LAB — BASIC METABOLIC PANEL
BUN: 26 mg/dL — ABNORMAL HIGH (ref 6–23)
CO2: 26 mEq/L (ref 19–32)
Calcium: 9.9 mg/dL (ref 8.4–10.5)
Chloride: 105 mEq/L (ref 96–112)
Creatinine, Ser: 1.26 mg/dL (ref 0.40–1.50)
GFR: 59.73 mL/min — ABNORMAL LOW (ref >60.00)
Glucose, Bld: 116 mg/dL — ABNORMAL HIGH (ref 70–99)
Potassium: 4.4 mEq/L (ref 3.5–5.1)
Sodium: 140 mEq/L (ref 135–145)

## 2017-08-20 ENCOUNTER — Other Ambulatory Visit: Payer: Self-pay

## 2017-08-20 MED ORDER — BAYER MICROLET LANCETS MISC: 11 refills | Status: DC

## 2017-08-20 NOTE — Telephone Encounter (Signed)
Last OV 06/26/17 last filled by Dr.Walker 11/23/15 100 11rf

## 2017-09-05 ENCOUNTER — Other Ambulatory Visit: Payer: Self-pay | Admitting: Family Medicine

## 2017-09-17 ENCOUNTER — Telehealth: Payer: Self-pay | Admitting: Family Medicine

## 2017-09-17 DIAGNOSIS — Z872 Personal history of diseases of the skin and subcutaneous tissue: Secondary | ICD-10-CM | POA: Diagnosis not present

## 2017-09-17 DIAGNOSIS — L578 Other skin changes due to chronic exposure to nonionizing radiation: Secondary | ICD-10-CM | POA: Diagnosis not present

## 2017-09-17 DIAGNOSIS — Z859 Personal history of malignant neoplasm, unspecified: Secondary | ICD-10-CM | POA: Diagnosis not present

## 2017-09-17 DIAGNOSIS — D485 Neoplasm of uncertain behavior of skin: Secondary | ICD-10-CM | POA: Diagnosis not present

## 2017-09-17 DIAGNOSIS — L218 Other seborrheic dermatitis: Secondary | ICD-10-CM | POA: Diagnosis not present

## 2017-09-17 DIAGNOSIS — L821 Other seborrheic keratosis: Secondary | ICD-10-CM | POA: Diagnosis not present

## 2017-09-17 NOTE — Telephone Encounter (Signed)
Please advise 

## 2017-09-17 NOTE — Telephone Encounter (Signed)
Please advise the patient to contact his pharmacy to see if it is part of the recall.  If it is please see if they have gotten an alternative manufacturer.  If they have not we should consider discontinuing.

## 2017-09-17 NOTE — Telephone Encounter (Signed)
Pt wanted to know if he needed to continue to take amLODipine (NORVASC) 10 MG tablet because it has been linked to cancer. Please advise pt at [phone] or [phone]

## 2017-09-18 MED ORDER — ADULT BLOOD PRESSURE CUFF LG KIT: PACK | 0 refills | Status: DC

## 2017-09-18 NOTE — Telephone Encounter (Signed)
Printed.  Please place up front for pickup.

## 2017-09-18 NOTE — Telephone Encounter (Signed)
At front desk for pick up

## 2017-09-18 NOTE — Telephone Encounter (Signed)
Patients wife notified, she states the patients blood pressure cuff stopped working and they would like to come by and pick up a new rx for blood pressure monitor

## 2017-09-21 ENCOUNTER — Ambulatory Visit (INDEPENDENT_AMBULATORY_CARE_PROVIDER_SITE_OTHER): Payer: Medicare Other | Admitting: Family Medicine

## 2017-09-21 VITALS — BP 140/80 | HR 67 | Temp 97.6°F | Resp 18 | Ht 70.0 in | Wt 245.0 lb

## 2017-09-21 DIAGNOSIS — R1011 Right upper quadrant pain: Secondary | ICD-10-CM | POA: Diagnosis not present

## 2017-09-21 LAB — COMPREHENSIVE METABOLIC PANEL
ALT: 19 U/L (ref 0–53)
AST: 16 U/L (ref 0–37)
Albumin: 4.7 g/dL (ref 3.5–5.2)
Alkaline Phosphatase: 70 U/L (ref 39–117)
BUN: 19 mg/dL (ref 6–23)
CO2: 27 mEq/L (ref 19–32)
Calcium: 9.3 mg/dL (ref 8.4–10.5)
Chloride: 104 mEq/L (ref 96–112)
Creatinine, Ser: 0.98 mg/dL (ref 0.40–1.50)
GFR: 79.8 mL/min (ref >60.00)
Glucose, Bld: 176 mg/dL — ABNORMAL HIGH (ref 70–99)
Potassium: 4 mEq/L (ref 3.5–5.1)
Sodium: 139 mEq/L (ref 135–145)
Total Bilirubin: 0.7 mg/dL (ref 0.2–1.2)
Total Protein: 7.3 g/dL (ref 6.0–8.3)

## 2017-09-21 LAB — POCT URINALYSIS DIPSTICK
Bilirubin, UA: NEGATIVE
Blood, UA: NEGATIVE
Glucose, UA: NEGATIVE
Ketones, UA: NEGATIVE
Leukocytes, UA: NEGATIVE
Nitrite, UA: NEGATIVE
Protein, UA: NEGATIVE
Spec Grav, UA: 1.015 (ref 1.010–1.025)
Urobilinogen, UA: 1 E.U./dL
pH, UA: 7.5 (ref 5.0–8.0)

## 2017-09-21 LAB — CBC WITH DIFFERENTIAL/PLATELET
Basophils Absolute: 0 10*3/uL (ref 0.0–0.1)
Basophils Relative: 0.5 % (ref 0.0–3.0)
Eosinophils Absolute: 0.1 10*3/uL (ref 0.0–0.7)
Eosinophils Relative: 1.7 % (ref 0.0–5.0)
HCT: 48.8 % (ref 39.0–52.0)
Hemoglobin: 16.5 g/dL (ref 13.0–17.0)
Lymphocytes Relative: 19.6 % (ref 12.0–46.0)
Lymphs Abs: 1.7 10*3/uL (ref 0.7–4.0)
MCHC: 33.8 g/dL (ref 30.0–36.0)
MCV: 89.4 fl (ref 78.0–100.0)
Monocytes Absolute: 0.8 10*3/uL (ref 0.1–1.0)
Monocytes Relative: 9.5 % (ref 3.0–12.0)
Neutro Abs: 5.8 10*3/uL (ref 1.4–7.7)
Neutrophils Relative %: 68.7 % (ref 43.0–77.0)
Platelets: 166 10*3/uL (ref 150.0–400.0)
RBC: 5.46 Mil/uL (ref 4.22–5.81)
RDW: 14 % (ref 11.5–15.5)
WBC: 8.5 10*3/uL (ref 4.0–10.5)

## 2017-09-21 NOTE — Progress Notes (Signed)
  Jesus Rumps, MD Phone: [phone]  Jesus Peterson is a 71 y.o. male who presents today for same-day visit.  Patient notes 36 hours ago developing a dull ache in his right upper quadrant.  Notes it is not sharp.  Notes it does not radiate.  No dysuria or hematuria.  Possible urinary frequency.  No nausea, vomiting, constipation, or diarrhea.  No fevers.  Does have a history of kidney stones which this sort of feels similar to him.  Social History   Tobacco Use  Smoking Status Never Smoker  Smokeless Tobacco Never Used     ROS see history of present illness  Objective  Physical Exam Vitals:   09/21/17 0912  BP: 140/80  Pulse: 67  Resp: 18  Temp: 97.6 F (36.4 C)  SpO2: 97%    BP Readings from Last 3 Encounters:  09/21/17 140/80  06/26/17 120/64  06/26/17 120/64   Wt Readings from Last 3 Encounters:  09/21/17 245 lb (111.1 kg)  06/26/17 245 lb (111.1 kg)  06/26/17 245 lb (111.1 kg)    Physical Exam  Constitutional: No distress.  Cardiovascular: Normal rate, regular rhythm and normal heart sounds.  Pulmonary/Chest: Effort normal and breath sounds normal.  Abdominal: Soft. Bowel sounds are normal. He exhibits no distension. There is no tenderness. There is no rebound and no guarding.  Negative Murphy sign  Neurological: He is alert. Gait normal.  Skin: Skin is warm and dry. He is not diaphoretic.     Assessment/Plan: Please see individual problem list.  Right upper quadrant abdominal pain Differential includes biliary, hepatic, musculoskeletal, and urinary causes.  Exam unremarkable.  Urinalysis unremarkable.  Obtain CMP and CBC.  Determine further workup based on those.  Given return precautions.   Shloma was seen today for abdominal pain.  Diagnoses and all orders for this visit:  Right upper quadrant abdominal pain -     POCT Urinalysis Dipstick -     Comp Met (CMET) -     CBC w/Diff    Orders Placed This Encounter  Procedures  . Comp Met  (CMET)  . CBC w/Diff  . POCT Urinalysis Dipstick    No orders of the defined types were placed in this encounter.    Jesus Rumps, MD [place]

## 2017-09-21 NOTE — Patient Instructions (Signed)
Nice to see you. We are going to get some lab work today and check your urine.  This will help determine what we need to do for you. If your stomach pain gets worse or you develop any new symptoms please be evaluated.

## 2017-09-21 NOTE — Assessment & Plan Note (Addendum)
Differential includes biliary, hepatic, musculoskeletal, and urinary causes.  Exam unremarkable.  Urinalysis unremarkable.  Obtain CMP and CBC.  Determine further workup based on those.  Given return precautions.

## 2017-10-17 DIAGNOSIS — D2222 Melanocytic nevi of left ear and external auricular canal: Secondary | ICD-10-CM | POA: Diagnosis not present

## 2017-10-17 DIAGNOSIS — D0339 Melanoma in situ of other parts of face: Secondary | ICD-10-CM | POA: Diagnosis not present

## 2017-10-17 DIAGNOSIS — D485 Neoplasm of uncertain behavior of skin: Secondary | ICD-10-CM | POA: Diagnosis not present

## 2017-10-17 DIAGNOSIS — C439 Malignant melanoma of skin, unspecified: Secondary | ICD-10-CM

## 2017-10-17 HISTORY — DX: Malignant melanoma of skin, unspecified: C43.9

## 2017-10-31 DIAGNOSIS — D0339 Melanoma in situ of other parts of face: Secondary | ICD-10-CM | POA: Diagnosis not present

## 2017-11-05 ENCOUNTER — Ambulatory Visit: Payer: Medicare Other | Admitting: Podiatry

## 2017-11-07 ENCOUNTER — Telehealth: Payer: Self-pay | Admitting: Family Medicine

## 2017-11-07 NOTE — Telephone Encounter (Signed)
Copied from [place]. Topic: Quick Communication - See Telephone Encounter >> Nov 07, 2017  2:38 PM Bea Graff, NT wrote: CRM for notification. See Telephone encounter for: Pt calling needing an rx for a new blood pressure cuff. His machine messed up. Uses [facility] on [address].  11/07/17.

## 2017-11-07 NOTE — Telephone Encounter (Signed)
Pt equipment request

## 2017-11-07 NOTE — Telephone Encounter (Signed)
Please advise 

## 2017-11-11 MED ORDER — ADULT BLOOD PRESSURE CUFF LG KIT: PACK | 0 refills | Status: AC

## 2017-11-11 NOTE — Telephone Encounter (Signed)
Prescription printed.  Please send to pharmacy.

## 2017-11-12 NOTE — Telephone Encounter (Signed)
faxed

## 2017-12-11 DIAGNOSIS — D0339 Melanoma in situ of other parts of face: Secondary | ICD-10-CM | POA: Diagnosis not present

## 2017-12-11 DIAGNOSIS — Z85820 Personal history of malignant melanoma of skin: Secondary | ICD-10-CM | POA: Insufficient documentation

## 2017-12-24 ENCOUNTER — Ambulatory Visit (INDEPENDENT_AMBULATORY_CARE_PROVIDER_SITE_OTHER): Payer: Medicare Other | Admitting: Family Medicine

## 2017-12-24 ENCOUNTER — Encounter: Payer: Self-pay | Admitting: Family Medicine

## 2017-12-24 ENCOUNTER — Telehealth: Payer: Self-pay | Admitting: Family Medicine

## 2017-12-24 ENCOUNTER — Other Ambulatory Visit: Payer: Self-pay

## 2017-12-24 VITALS — BP 120/70 | HR 67 | Temp 98.2°F | Wt 244.6 lb

## 2017-12-24 DIAGNOSIS — I10 Essential (primary) hypertension: Secondary | ICD-10-CM

## 2017-12-24 DIAGNOSIS — Z860100 Personal history of colon polyps, unspecified: Secondary | ICD-10-CM

## 2017-12-24 DIAGNOSIS — E119 Type 2 diabetes mellitus without complications: Secondary | ICD-10-CM | POA: Diagnosis not present

## 2017-12-24 DIAGNOSIS — E785 Hyperlipidemia, unspecified: Secondary | ICD-10-CM | POA: Diagnosis not present

## 2017-12-24 DIAGNOSIS — D0339 Melanoma in situ of other parts of face: Secondary | ICD-10-CM

## 2017-12-24 MED ORDER — BLOOD GLUCOSE MONITOR KIT: PACK | 0 refills | Status: DC

## 2017-12-24 NOTE — Telephone Encounter (Signed)
Copied from [place] [id number]. Topic: Quick Communication - See Telephone Encounter >> Dec 24, 2017  3:08 PM Vernona Rieger wrote: CRM for notification. See Telephone encounter for:   12/24/17.  Patient said that he was in the office today with [facility] & he told him to go and pick him up a blood sugar kit & that he wouldn't need a prescription but he does for the entire kit. Please send to [facility] on [address]

## 2017-12-24 NOTE — Progress Notes (Signed)
  [name], MD Phone: [phone]  Jesus Peterson is a 72 y.o. male who presents today for f/u.  HYPERTENSION  Disease Monitoring  Home BP Monitoring well controlled Chest pain- no    Dyspnea- no Medications  Compliance-  Taking amlodipine, coreg, HCTZ.  Edema- no  DIABETES Disease Monitoring: Blood Sugar ranges-150-204 Polyuria/phagia/dipsia- no      Optho-UTD Medications: none It does not really exercise.  He eats pretty much whatever he wants.  Does drink soda.    Patient recently had Mohs surgery for melanoma.  No apparent other treatment is needed per his report.  Has been healing well and is followed up with dermatology.  Some bleeding.  He has follow-up with the Mohs surgeon in 5 months.  Due for colon cancer screening. Reports he received a letter advising him of this.      Social History   Tobacco Use  Smoking Status Never Smoker  Smokeless Tobacco Never Used     ROS see history of present illness  Objective  Physical Exam Vitals:   12/24/17 1355  BP: 120/70  Pulse: 67  Temp: 98.2 F (36.8 C)  SpO2: 98%    BP Readings from Last 3 Encounters:  12/24/17 120/70  09/21/17 140/80  06/26/17 120/64   Wt Readings from Last 3 Encounters:  12/24/17 244 lb 9.6 oz (110.9 kg)  09/21/17 245 lb (111.1 kg)  06/26/17 245 lb (111.1 kg)    Physical Exam  Constitutional: No distress.  HENT:  Head:    Cardiovascular: Normal rate, regular rhythm and normal heart sounds.  Pulmonary/Chest: Effort normal and breath sounds normal.  Musculoskeletal: He exhibits no edema.  Neurological: He is alert. Gait normal.  Skin: Skin is warm and dry. He is not diaphoretic.     Assessment/Plan: Please see individual problem list.  Hypertension Controlled.  Continue current regimen.  Diabetes type 2, controlled Seems to be more uncontrolled than prior.  Check A1c.  Melanoma in situ of cheek [facility]) Status post Mohs surgery.  We will continue to follow with  dermatology.  History of colon polyps Refer back to GI for colonoscopy.   Orders Placed This Encounter  Procedures  . Lipid panel    Standing Status:   Future    Standing Expiration Date:   12/25/2018  . Hemoglobin A1c    Standing Status:   Future    Standing Expiration Date:   12/25/2018  . Basic Metabolic Panel (BMET)    Standing Status:   Future    Standing Expiration Date:   12/25/2018  . Ambulatory referral to Gastroenterology    Referral Priority:   Routine    Referral Type:   Consultation    Referral Reason:   Specialty Services Required    Number of Visits Requested:   1    No orders of the defined types were placed in this encounter.    [name], MD [place]

## 2017-12-24 NOTE — Assessment & Plan Note (Signed)
Status post Mohs surgery.  We will continue to follow with dermatology.

## 2017-12-24 NOTE — Assessment & Plan Note (Signed)
Refer back to GI for colonoscopy. 

## 2017-12-24 NOTE — Assessment & Plan Note (Signed)
Seems to be more uncontrolled than prior.  Check A1c.

## 2017-12-24 NOTE — Telephone Encounter (Signed)
Please fax to pharmacy

## 2017-12-24 NOTE — Assessment & Plan Note (Signed)
Controlled. Continue current regimen. 

## 2017-12-24 NOTE — Patient Instructions (Signed)
Nice to see you. Please keep your follow-up with dermatology. We will get you into GI for colonoscopy. We will have you return for fasting lab work.

## 2017-12-24 NOTE — Telephone Encounter (Signed)
Please advise 

## 2017-12-25 NOTE — Telephone Encounter (Signed)
faxed

## 2018-01-02 ENCOUNTER — Other Ambulatory Visit (INDEPENDENT_AMBULATORY_CARE_PROVIDER_SITE_OTHER): Payer: Medicare Other

## 2018-01-02 DIAGNOSIS — E785 Hyperlipidemia, unspecified: Secondary | ICD-10-CM

## 2018-01-02 DIAGNOSIS — E119 Type 2 diabetes mellitus without complications: Secondary | ICD-10-CM

## 2018-01-02 LAB — HEMOGLOBIN A1C: Hgb A1c MFr Bld: 7.3 % — ABNORMAL HIGH (ref 4.6–6.5)

## 2018-01-03 LAB — LIPID PANEL
Cholesterol: 144 mg/dL (ref 0–200)
HDL: 38.4 mg/dL — ABNORMAL LOW (ref >39.00)
NonHDL: 105.98
Total CHOL/HDL Ratio: 4
Triglycerides: 279 mg/dL — ABNORMAL HIGH (ref 0.0–149.0)
VLDL: 55.8 mg/dL — ABNORMAL HIGH (ref 0.0–40.0)

## 2018-01-03 LAB — BASIC METABOLIC PANEL
BUN: 25 mg/dL — ABNORMAL HIGH (ref 6–23)
CO2: 22 mEq/L (ref 19–32)
Calcium: 9.2 mg/dL (ref 8.4–10.5)
Chloride: 105 mEq/L (ref 96–112)
Creatinine, Ser: 0.98 mg/dL (ref 0.40–1.50)
GFR: 79.74 mL/min (ref >60.00)
Glucose, Bld: 138 mg/dL — ABNORMAL HIGH (ref 70–99)
Potassium: 4 mEq/L (ref 3.5–5.1)
Sodium: 136 mEq/L (ref 135–145)

## 2018-01-03 LAB — LDL CHOLESTEROL, DIRECT: Direct LDL: 77 mg/dL

## 2018-01-05 ENCOUNTER — Other Ambulatory Visit: Payer: Self-pay | Admitting: Family Medicine

## 2018-01-05 MED ORDER — METFORMIN HCL 500 MG PO TABS: 500.0000 mg | ORAL_TABLET | Freq: Two times a day (BID) | ORAL | 3 refills | Status: DC

## 2018-01-15 ENCOUNTER — Other Ambulatory Visit: Payer: Self-pay | Admitting: Family Medicine

## 2018-01-16 DIAGNOSIS — L814 Other melanin hyperpigmentation: Secondary | ICD-10-CM | POA: Diagnosis not present

## 2018-01-16 DIAGNOSIS — D1800 Hemangioma unspecified site: Secondary | ICD-10-CM | POA: Diagnosis not present

## 2018-01-28 ENCOUNTER — Other Ambulatory Visit: Payer: Self-pay | Admitting: Family Medicine

## 2018-02-21 ENCOUNTER — Other Ambulatory Visit: Payer: Self-pay

## 2018-02-21 MED ORDER — BAYER MICROLET LANCETS MISC: 11 refills | Status: DC

## 2018-02-21 MED ORDER — GLUCOSE BLOOD VI STRP: ORAL_STRIP | 11 refills | Status: DC

## 2018-03-18 DIAGNOSIS — L304 Erythema intertrigo: Secondary | ICD-10-CM | POA: Diagnosis not present

## 2018-03-18 DIAGNOSIS — Z859 Personal history of malignant neoplasm, unspecified: Secondary | ICD-10-CM | POA: Diagnosis not present

## 2018-03-18 DIAGNOSIS — Z85820 Personal history of malignant melanoma of skin: Secondary | ICD-10-CM | POA: Diagnosis not present

## 2018-03-18 DIAGNOSIS — L821 Other seborrheic keratosis: Secondary | ICD-10-CM | POA: Diagnosis not present

## 2018-03-18 DIAGNOSIS — L570 Actinic keratosis: Secondary | ICD-10-CM | POA: Diagnosis not present

## 2018-03-18 DIAGNOSIS — L578 Other skin changes due to chronic exposure to nonionizing radiation: Secondary | ICD-10-CM | POA: Diagnosis not present

## 2018-03-18 DIAGNOSIS — Z872 Personal history of diseases of the skin and subcutaneous tissue: Secondary | ICD-10-CM | POA: Diagnosis not present

## 2018-03-19 DIAGNOSIS — Z86010 Personal history of colonic polyps: Secondary | ICD-10-CM | POA: Diagnosis not present

## 2018-03-29 ENCOUNTER — Encounter: Payer: Self-pay | Admitting: Internal Medicine

## 2018-03-29 ENCOUNTER — Ambulatory Visit (INDEPENDENT_AMBULATORY_CARE_PROVIDER_SITE_OTHER): Payer: Medicare Other | Admitting: Internal Medicine

## 2018-03-29 VITALS — BP 124/68 | HR 80 | Temp 98.3°F | Ht 70.0 in | Wt 243.2 lb

## 2018-03-29 DIAGNOSIS — E119 Type 2 diabetes mellitus without complications: Secondary | ICD-10-CM | POA: Diagnosis not present

## 2018-03-29 DIAGNOSIS — J029 Acute pharyngitis, unspecified: Secondary | ICD-10-CM | POA: Diagnosis not present

## 2018-03-29 DIAGNOSIS — J020 Streptococcal pharyngitis: Secondary | ICD-10-CM

## 2018-03-29 DIAGNOSIS — R197 Diarrhea, unspecified: Secondary | ICD-10-CM

## 2018-03-29 DIAGNOSIS — J069 Acute upper respiratory infection, unspecified: Secondary | ICD-10-CM | POA: Diagnosis not present

## 2018-03-29 LAB — POC INFLUENZA A&B (BINAX/QUICKVUE)
Influenza A, POC: NEGATIVE
Influenza B, POC: NEGATIVE

## 2018-03-29 LAB — POCT RAPID STREP A (OFFICE): Rapid Strep A Screen: POSITIVE — AB

## 2018-03-29 MED ORDER — METFORMIN HCL 500 MG PO TABS: 500.0000 mg | ORAL_TABLET | Freq: Every day | ORAL | 3 refills | Status: DC

## 2018-03-29 MED ORDER — AZITHROMYCIN 250 MG PO TABS: ORAL_TABLET | ORAL | 0 refills | Status: DC

## 2018-03-29 NOTE — Progress Notes (Signed)
Chief Complaint  Patient presents with  . Sore Throat   Acute visit  Productive Cough x 1 week and sore/scratchy throat nothing tried. No sick contacts, no fever or allergies, no chills or body aches. He also c/o sinus drainage. Overall feeling better but throat still sore. He also reports reflux sx's when asked and metformin upsetting his stomach I.e diarrhea    Review of Systems  Constitutional: Negative for chills, fever and malaise/fatigue.  HENT: Positive for sore throat.        +sneezing runny nose  Eyes: Negative for blurred vision.  Respiratory: Positive for cough and sputum production. Negative for shortness of breath and wheezing.   Cardiovascular: Negative for chest pain.  Gastrointestinal: Positive for diarrhea.   Past Medical History:  Diagnosis Date  . Erectile dysfunction   . Hyperlipidemia   . Hypertension   . Kidney stone    several  . Seborrheic dermatitis   . Treadmill stress test negative for angina pectoris 2013   Dr. Nehemiah Massed, normal per pt  . Venous angioma of brain (HCC)    Pons, found 2/2 tinnitus, followed on MRI   No past surgical history on file. Family History  Problem Relation Age of Onset  . Stroke Mother   . Heart disease Mother        s/p CABG  . Heart disease Father   . Heart disease Brother        s/p CABG  . Heart disease Brother        s/p angioplasty  . Arthritis Brother    Social History   Socioeconomic History  . Marital status: Married    Spouse name: Not on file  . Number of children: Not on file  . Years of education: Not on file  . Highest education level: Not on file  Occupational History  . Not on file  Social Needs  . Financial resource strain: Not on file  . Food insecurity:    Worry: Not on file    Inability: Not on file  . Transportation needs:    Medical: Not on file    Non-medical: Not on file  Tobacco Use  . Smoking status: Never Smoker  . Smokeless tobacco: Never Used  Substance and Sexual Activity   . Alcohol use: No  . Drug use: Not on file  . Sexual activity: Not on file  Lifestyle  . Physical activity:    Days per week: Not on file    Minutes per session: Not on file  . Stress: Not on file  Relationships  . Social connections:    Talks on phone: Not on file    Gets together: Not on file    Attends religious service: Not on file    Active member of club or organization: Not on file    Attends meetings of clubs or organizations: Not on file    Relationship status: Not on file  . Intimate partner violence:    Fear of current or ex partner: Not on file    Emotionally abused: Not on file    Physically abused: Not on file    Forced sexual activity: Not on file  Other Topics Concern  . Not on file  Social History Narrative   Lives in [place] with wife, 44years. Children - son 39 and daughter 36.   Work - Scientist, research (physical sciences)   Diet - regular   Exercise - limited, walks occas   Current Meds  Medication Sig  . amLODipine (NORVASC)  10 MG tablet TAKE 1 TABLET EVERY DAY  . aspirin 81 MG tablet Take 81 mg by mouth daily.  Marland Kitchen BAYER MICROLET LANCETS lancets USE  TWICE DAILY ICD 10 E11.9  . blood glucose meter kit and supplies KIT Dispense based on patient and insurance preference. Check once daily fasting, Dx Code E11.9  . Blood Pressure Monitoring (ADULT BLOOD PRESSURE CUFF LG) KIT Use daily to check blood pressure  . carvedilol (COREG) 12.5 MG tablet TAKE 1 TABLET TWICE DAILY WITH MEALS  . GARLIC OIL PO Take 1 capsule by mouth 4 (four) times daily.  Marland Kitchen glucose blood (BAYER CONTOUR NEXT TEST) test strip USE  STRIP TO CHECK GLUCOSE TWICE DAILY E11.9  . hydrocortisone 2.5 % cream Apply 1 application topically 2 (two) times daily.  Jerrye Beavers Polysacch (ALCORTIN A) 1-2-1 % GEL Apply 1 application topically daily.  Marland Kitchen ketoconazole (NIZORAL) 2 % cream Apply 1 application topically 2 (two) times daily.  Marland Kitchen losartan (COZAAR) 100 MG tablet Take 1 tablet (100 mg total) by mouth daily.  . metFORMIN  (GLUCOPHAGE) 500 MG tablet Take 1 tablet (500 mg total) by mouth 2 (two) times daily with a meal.  . simvastatin (ZOCOR) 40 MG tablet TAKE 1/2 TABLET EVERY EVENING   Allergies  Allergen Reactions  . Contrast Media [Iodinated Diagnostic Agents]   . Penicillins    Recent Results (from the past 2160 hour(s))  Basic Metabolic Panel (BMET)     Status: Abnormal   Collection Time: 01/02/18  1:52 PM  Result Value Ref Range   Sodium 136 135 - 145 mEq/L   Potassium 4.0 3.5 - 5.1 mEq/L   Chloride 105 96 - 112 mEq/L   CO2 22 19 - 32 mEq/L   Glucose, Bld 138 (H) 70 - 99 mg/dL   BUN 25 (H) 6 - 23 mg/dL   Creatinine, Ser 0.98 0.40 - 1.50 mg/dL   Calcium 9.2 8.4 - 10.5 mg/dL   GFR 79.74 >60.00 mL/min  Hemoglobin A1c     Status: Abnormal   Collection Time: 01/02/18  1:52 PM  Result Value Ref Range   Hgb A1c MFr Bld 7.3 (H) 4.6 - 6.5 %    Comment: Glycemic Control Guidelines for People with Diabetes:Non Diabetic:  <6%Goal of Therapy: <7%Additional Action Suggested:  >8%   Lipid panel     Status: Abnormal   Collection Time: 01/02/18  1:52 PM  Result Value Ref Range   Cholesterol 144 0 - 200 mg/dL    Comment: ATP III Classification       Desirable:  < 200 mg/dL               Borderline High:  200 - 239 mg/dL          High:  > = 240 mg/dL   Triglycerides 279.0 (H) 0.0 - 149.0 mg/dL    Comment: Normal:  <150 mg/dLBorderline High:  150 - 199 mg/dL   HDL 38.40 (L) >39.00 mg/dL   VLDL 55.8 (H) 0.0 - 40.0 mg/dL   Total CHOL/HDL Ratio 4     Comment:                Men          Women1/2 Average Risk     3.4          3.3Average Risk          5.0          4.42X Average Risk  9.6          7.13X Average Risk          15.0          11.0                       NonHDL 105.98     Comment: NOTE:  Non-HDL goal should be 30 mg/dL higher than patient's LDL goal (i.e. LDL goal of < 70 mg/dL, would have non-HDL goal of < 100 mg/dL)  LDL cholesterol, direct     Status: None   Collection Time: 01/02/18  1:52 PM   Result Value Ref Range   Direct LDL 77.0 mg/dL    Comment: Optimal:  <100 mg/dLNear or Above Optimal:  100-129 mg/dLBorderline High:  130-159 mg/dLHigh:  160-189 mg/dLVery High:  >190 mg/dL  POCT rapid strep A     Status: Abnormal   Collection Time: 03/29/18  8:55 AM  Result Value Ref Range   Rapid Strep A Screen Positive (A) Negative   Objective  Body mass index is 34.9 kg/m. Wt Readings from Last 3 Encounters:  03/29/18 243 lb 3.2 oz (110.3 kg)  12/24/17 244 lb 9.6 oz (110.9 kg)  09/21/17 245 lb (111.1 kg)   Temp Readings from Last 3 Encounters:  03/29/18 98.3 F (36.8 C) (Oral)  12/24/17 98.2 F (36.8 C) (Oral)  09/21/17 97.6 F (36.4 C) (Oral)   BP Readings from Last 3 Encounters:  03/29/18 124/68  12/24/17 120/70  09/21/17 140/80   Pulse Readings from Last 3 Encounters:  03/29/18 80  12/24/17 67  09/21/17 67    Physical Exam  Constitutional: He is oriented to person, place, and time. Vital signs are normal. He appears well-developed and well-nourished. He is cooperative.  HENT:  Head: Normocephalic and atraumatic.  Mouth/Throat: Uvula is midline and mucous membranes are normal. Posterior oropharyngeal erythema present. No oropharyngeal exudate.  Eyes: Pupils are equal, round, and reactive to light. Conjunctivae and EOM are normal.  Cardiovascular: Normal rate, regular rhythm and normal heart sounds.  Pulmonary/Chest: Effort normal and breath sounds normal.  Neurological: He is alert and oriented to person, place, and time. Gait normal.  Skin: Skin is warm, dry and intact.  Psychiatric: He has a normal mood and affect. His speech is normal and behavior is normal. Judgment and thought content normal. Cognition and memory are normal.  Nursing note and vitals reviewed.   Assessment   1. Sore throat strep+ and flu neg/URI 2. DM 2 A1C 7.3 01/02/18  3. Diarrhea could be 2/2 bid metformin 500 mg bid  Plan  1. zpack supportive care  2. Reduce metformin to qd with  food  3. See if helps at f/u reduction metformin  Provider: Dr. Olivia Mackie McLean-Scocuzza-[facility]

## 2018-03-29 NOTE — Patient Instructions (Addendum)
Reduce metformin to 1x per day with food  Please get Zpack  Take care   Strep Throat Strep throat is a bacterial infection of the throat. Your health care provider may call the infection tonsillitis or pharyngitis, depending on whether there is swelling in the tonsils or at the back of the throat. Strep throat is most common during the cold months of the year in children who are 3-73 years of age, but it can happen during any season in people of any age. This infection is spread from person to person (contagious) through coughing, sneezing, or close contact. What are the causes? Strep throat is caused by the bacteria called Streptococcus pyogenes. What increases the risk? This condition is more likely to develop in:  People who spend time in crowded places where the infection can spread easily.  People who have close contact with someone who has strep throat.  What are the signs or symptoms? Symptoms of this condition include:  Fever or chills.  Redness, swelling, or pain in the tonsils or throat.  Pain or difficulty when swallowing.  White or yellow spots on the tonsils or throat.  Swollen, tender glands in the neck or under the jaw.  Red rash all over the body (rare).  How is this diagnosed? This condition is diagnosed by performing a rapid strep test or by taking a swab of your throat (throat culture test). Results from a rapid strep test are usually ready in a few minutes, but throat culture test results are available after one or two days. How is this treated? This condition is treated with antibiotic medicine. Follow these instructions at home: Medicines  Take over-the-counter and prescription medicines only as told by your health care provider.  Take your antibiotic as told by your health care provider. Do not stop taking the antibiotic even if you start to feel better.  Have family members who also have a sore throat or fever tested for strep throat. They may need  antibiotics if they have the strep infection. Eating and drinking  Do not share food, drinking cups, or personal items that could cause the infection to spread to other people.  If swallowing is difficult, try eating soft foods until your sore throat feels better.  Drink enough fluid to keep your urine clear or pale yellow. General instructions  Gargle with a salt-water mixture 3-4 times per day or as needed. To make a salt-water mixture, completely dissolve -1 tsp of salt in 1 cup of warm water.  Make sure that all household members wash their hands well.  Get plenty of rest.  Stay home from school or work until you have been taking antibiotics for 24 hours.  Keep all follow-up visits as told by your health care provider. This is important. Contact a health care provider if:  The glands in your neck continue to get bigger.  You develop a rash, cough, or earache.  You cough up a thick liquid that is green, yellow-brown, or bloody.  You have pain or discomfort that does not get better with medicine.  Your problems seem to be getting worse rather than better.  You have a fever. Get help right away if:  You have new symptoms, such as vomiting, severe headache, stiff or painful neck, chest pain, or shortness of breath.  You have severe throat pain, drooling, or changes in your voice.  You have swelling of the neck, or the skin on the neck becomes red and tender.  You have  signs of dehydration, such as fatigue, dry mouth, and decreased urination.  You become increasingly sleepy, or you cannot wake up completely.  Your joints become red or painful. This information is not intended to replace advice given to you by your health care provider. Make sure you discuss any questions you have with your health care provider. Document Released: 09/29/2000 Document Revised: 05/31/2016 Document Reviewed: 01/25/2015 Elsevier Interactive Patient Education  [employer].

## 2018-03-29 NOTE — Progress Notes (Signed)
Pre visit review using our clinic review tool, if applicable. No additional management support is needed unless otherwise documented below in the visit note. 

## 2018-04-08 ENCOUNTER — Ambulatory Visit (INDEPENDENT_AMBULATORY_CARE_PROVIDER_SITE_OTHER): Payer: Medicare Other | Admitting: Family Medicine

## 2018-04-08 ENCOUNTER — Encounter: Payer: Self-pay | Admitting: Family Medicine

## 2018-04-08 VITALS — BP 150/90 | HR 69 | Temp 98.6°F | Ht 70.0 in | Wt 243.8 lb

## 2018-04-08 DIAGNOSIS — I10 Essential (primary) hypertension: Secondary | ICD-10-CM | POA: Diagnosis not present

## 2018-04-08 DIAGNOSIS — J020 Streptococcal pharyngitis: Secondary | ICD-10-CM | POA: Diagnosis not present

## 2018-04-08 DIAGNOSIS — E785 Hyperlipidemia, unspecified: Secondary | ICD-10-CM

## 2018-04-08 DIAGNOSIS — Z125 Encounter for screening for malignant neoplasm of prostate: Secondary | ICD-10-CM

## 2018-04-08 DIAGNOSIS — E119 Type 2 diabetes mellitus without complications: Secondary | ICD-10-CM | POA: Diagnosis not present

## 2018-04-08 LAB — HEMOGLOBIN A1C: Hgb A1c MFr Bld: 7 % — ABNORMAL HIGH (ref 4.6–6.5)

## 2018-04-08 LAB — COMPREHENSIVE METABOLIC PANEL
ALT: 20 U/L (ref 0–53)
AST: 16 U/L (ref 0–37)
Albumin: 4.3 g/dL (ref 3.5–5.2)
Alkaline Phosphatase: 61 U/L (ref 39–117)
BUN: 22 mg/dL (ref 6–23)
CO2: 23 mEq/L (ref 19–32)
Calcium: 9 mg/dL (ref 8.4–10.5)
Chloride: 105 mEq/L (ref 96–112)
Creatinine, Ser: 1.11 mg/dL (ref 0.40–1.50)
GFR: 69.01 mL/min (ref >60.00)
Glucose, Bld: 123 mg/dL — ABNORMAL HIGH (ref 70–99)
Potassium: 3.7 mEq/L (ref 3.5–5.1)
Sodium: 140 mEq/L (ref 135–145)
Total Bilirubin: 0.7 mg/dL (ref 0.2–1.2)
Total Protein: 6.9 g/dL (ref 6.0–8.3)

## 2018-04-08 LAB — PSA, MEDICARE: PSA: 0.48 ng/ml (ref 0.10–4.00)

## 2018-04-08 MED ORDER — METFORMIN HCL ER 500 MG PO TB24: 500.0000 mg | ORAL_TABLET | Freq: Every day | ORAL | 1 refills | Status: DC

## 2018-04-08 NOTE — Assessment & Plan Note (Signed)
Well-controlled at home.  He did not take his medications yet today.  He will take them once he gets home.  He will monitor his blood pressure.

## 2018-04-08 NOTE — Assessment & Plan Note (Signed)
Check A1c.  Will change metformin to extended release to see if that helps with nausea and diarrhea.  If it does not he will let us know.

## 2018-04-08 NOTE — Patient Instructions (Addendum)
Nice to see you. We will check lab work today and contact you with the results. We will try extended release metformin to see if that helps with your symptoms of nausea and diarrhea. You are due for a tetanus vaccine. Please get this at your local pharmacy.

## 2018-04-08 NOTE — Assessment & Plan Note (Signed)
Informed decision-making conversation had with patient regarding prostate cancer screening.  He opted into screening.  PSA ordered.

## 2018-04-08 NOTE — Assessment & Plan Note (Signed)
Symptoms improved with antibiotics.  He will monitor for recurrence.

## 2018-04-08 NOTE — Assessment & Plan Note (Signed)
LDL well controlled on most recent check.  Continue simvastatin.

## 2018-04-08 NOTE — Progress Notes (Signed)
  [name], MD Phone: [phone]  Jesus Peterson is a 72 y.o. male who presents today for f/u.  CC: htn, dm, strep throat  HYPERTENSION  Disease Monitoring  Home BP Monitoring 130/80 Chest pain- no    Dyspnea- no Medications  Compliance-  Taking amlodipine, coreg, losartan.  Edema- no  DIABETES Disease Monitoring: Blood Sugar ranges-130-170 Polyuria/phagia/dipsia- no      Optho- UTD Medications: Compliance- taking metformin, makes him nauseous and gives diarrhea Hypoglycemic symptoms- no  HYPERLIPIDEMIA Symptoms Chest pain on exertion:  no Medications: Compliance- taking simvastatin Right upper quadrant pain- no  Muscle aches- no  Recently seen for strep throat.  He notes this has improved.  He finished the azithromycin.  He notes no sore throat or cough.  Does note his left ear feels like it is in a barrel at times.  Voice remains a little raspy though is improving.    Social History   Tobacco Use  Smoking Status Never Smoker  Smokeless Tobacco Never Used     ROS see history of present illness  Objective  Physical Exam Vitals:   04/08/18 1347  BP: (!) 150/90  Pulse: 69  Temp: 98.6 F (37 C)  SpO2: 97%    BP Readings from Last 3 Encounters:  04/08/18 (!) 150/90  03/29/18 124/68  12/24/17 120/70   Wt Readings from Last 3 Encounters:  04/08/18 243 lb 12.8 oz (110.6 kg)  03/29/18 243 lb 3.2 oz (110.3 kg)  12/24/17 244 lb 9.6 oz (110.9 kg)    Physical Exam  Constitutional: No distress.  HENT:  Head: Normocephalic and atraumatic.  Mouth/Throat: Oropharynx is clear and moist.  Normal TMs bilaterally  Cardiovascular: Normal rate, regular rhythm and normal heart sounds.  Pulmonary/Chest: Effort normal and breath sounds normal.  Abdominal: Soft. Bowel sounds are normal. He exhibits no distension. There is no tenderness.  Musculoskeletal: He exhibits no edema.  Neurological: He is alert.  Skin: Skin is warm and dry. He is not diaphoretic.      Assessment/Plan: Please see individual problem list.  Hypertension Well-controlled at home.  He did not take his medications yet today.  He will take them once he gets home.  He will monitor his blood pressure.  Diabetes type 2, controlled Check A1c.  Will change metformin to extended release to see if that helps with nausea and diarrhea.  If it does not he will let us know.  Hyperlipidemia LDL well controlled on most recent check.  Continue simvastatin.  Prostate cancer screening Informed decision-making conversation had with patient regarding prostate cancer screening.  He opted into screening.  PSA ordered.  Strep throat Symptoms improved with antibiotics.  He will monitor for recurrence.  Health maintenance: Discussed need for tetanus vaccination.  He will get this at his pharmacy.  Orders Placed This Encounter  Procedures  . HgB A1c  . PSA, Medicare  . Comp Met (CMET)    Meds ordered this encounter  Medications  . metFORMIN (GLUCOPHAGE XR) 500 MG 24 hr tablet    Sig: Take 1 tablet (500 mg total) by mouth daily with breakfast.    Dispense:  30 tablet    Refill:  1     [name], MD [place]

## 2018-04-13 ENCOUNTER — Other Ambulatory Visit: Payer: Self-pay | Admitting: Family Medicine

## 2018-04-17 DIAGNOSIS — L570 Actinic keratosis: Secondary | ICD-10-CM | POA: Diagnosis not present

## 2018-04-17 DIAGNOSIS — L821 Other seborrheic keratosis: Secondary | ICD-10-CM | POA: Diagnosis not present

## 2018-05-01 DIAGNOSIS — D0339 Melanoma in situ of other parts of face: Secondary | ICD-10-CM | POA: Diagnosis not present

## 2018-06-27 ENCOUNTER — Ambulatory Visit: Payer: Medicare Other

## 2018-06-27 ENCOUNTER — Ambulatory Visit (INDEPENDENT_AMBULATORY_CARE_PROVIDER_SITE_OTHER): Payer: Medicare Other

## 2018-06-27 VITALS — BP 162/90 | HR 61 | Temp 98.2°F | Resp 15 | Ht 69.5 in | Wt 245.8 lb

## 2018-06-27 DIAGNOSIS — Z0000 Encounter for general adult medical examination without abnormal findings: Secondary | ICD-10-CM | POA: Diagnosis not present

## 2018-06-27 NOTE — Progress Notes (Signed)
Subjective:   Jesus Peterson is a 72 y.o. male who presents for Medicare Annual/Subsequent preventive examination.  Review of Systems:  No ROS.  Medicare Wellness Visit. Additional risk factors are reflected in the social history.  Cardiac Risk Factors include: advanced age (>75mn, >>3women);male gender;hypertension;diabetes mellitus;obesity (BMI >30kg/m2);sedentary lifestyle     Objective:    Vitals: BP (!) 162/90 (BP Location: Left Arm, Patient Position: Sitting, Cuff Size: Normal)   Pulse 61   Temp 98.2 F (36.8 C) (Oral)   Resp 15   Ht 5' 9.5" (1.765 m)   Wt 245 lb 12.8 oz (111.5 kg)   SpO2 96%   BMI 35.78 kg/m   Body mass index is 35.78 kg/m.  Advanced Directives 06/27/2018 06/26/2017 02/05/2017 02/11/2016 02/12/2015  Does Patient Have a Medical Advance Directive? Yes Yes Yes No Yes  Type of Advance Directive Living will Living will Living will - [place]  Does patient want to make changes to medical advance directive? No - Patient declined No - Patient declined No - Patient declined - No - Patient declined  [place] of [place]? - - - - No - copy requested    Tobacco Social History   Tobacco Use  Smoking Status Never Smoker  Smokeless Tobacco Never Used     Counseling given: Not Answered   Clinical Intake:  Pre-visit preparation completed: Yes  Pain : No/denies pain     Nutritional Status: BMI > 30  Obese Diabetes: Yes(Followed by pcp)  How often do you need to have someone help you when you read instructions, pamphlets, or other written materials from your doctor or pharmacy?: 1 - Never  Interpreter Needed?: No     Past Medical History:  Diagnosis Date  . Erectile dysfunction   . Hyperlipidemia   . Hypertension   . Kidney stone    several  . Seborrheic dermatitis   . Treadmill stress test negative for angina pectoris 2013   Dr. KNehemiah Massed normal per pt  . Ven[place] of brain [place]      Pons, found 2/2 tinnitus, followed on MRI   History reviewed. No pertinent surgical history. Family History  Problem Relation Age of Onset  . Stroke Mother   . Heart disease Mother        s/p CABG  . Heart disease Father   . Heart disease Brother        s/p CABG  . Heart disease Brother        s/p angioplasty  . Arthritis Brother    Social History   Socioeconomic History  . Marital status: Married    Spouse name: Not on file  . Number of children: Not on file  . Years of education: Not on file  . Highest education level: Not on file  Occupational History  . Not on file  Social Needs  . Financial resource strain: Not hard at all  . Food insecurity:    Worry: Never true    Inability: Never true  . Transportation needs:    Medical: No    Non-medical: No  Tobacco Use  . Smoking status: Never Smoker  . Smokeless tobacco: Never Used  Substance and Sexual Activity  . Alcohol use: No  . Drug use: Not on file  . Sexual activity: Not Currently  Lifestyle  . Physical activity:    Days per week: 0 days    Minutes per session: Not on file  .  Stress: Not at all  Relationships  . Social connections:    Talks on phone: Not on file    Gets together: Not on file    Attends religious service: Not on file    Active member of club or organization: Not on file    Attends meetings of clubs or organizations: Not on file    Relationship status: Married  Other Topics Concern  . Not on file  Social History Narrative   Lives in [place] with wife, 44years. Children - son 86 and daughter 41.   Work - [employer]   Diet - regular   Exercise - limited, walks occas    Outpatient Encounter Medications as of 06/27/2018  Medication Sig  . amLODipine (NORVASC) 10 MG tablet TAKE 1 TABLET EVERY DAY  . BAYER MICROLET LANCETS lancets USE  TWICE DAILY ICD 10 E11.9  . blood glucose meter kit and supplies KIT Dispense based on patient and insurance preference. Check once daily fasting, Dx Code E11.9   . Blood Pressure Monitoring (ADULT BLOOD PRESSURE CUFF LG) KIT Use daily to check blood pressure  . carvedilol (COREG) 12.5 MG tablet TAKE 1 TABLET TWICE DAILY WITH MEALS  . GARLIC OIL PO Take 1 capsule by mouth 4 (four) times daily.  Marland Kitchen glucose blood (BAYER CONTOUR NEXT TEST) test strip USE  STRIP TO CHECK GLUCOSE TWICE DAILY E11.9  . hydrocortisone 2.5 % cream Apply 1 application topically 2 (two) times daily.  Jesus Peterson Polysacch (ALCORTIN A) 1-2-1 % GEL Apply 1 application topically daily.  Marland Kitchen ketoconazole (NIZORAL) 2 % cream Apply 1 application topically 2 (two) times daily.  Marland Kitchen losartan (COZAAR) 100 MG tablet Take 1 tablet (100 mg total) by mouth daily.  . metFORMIN (GLUCOPHAGE XR) 500 MG 24 hr tablet Take 1 tablet (500 mg total) by mouth daily with breakfast.  . simvastatin (ZOCOR) 40 MG tablet TAKE 1/2 TABLET EVERY EVENING   No facility-administered encounter medications on file as of 06/27/2018.     Activities of Daily Living In your present state of health, do you have any difficulty performing the following activities: 06/27/2018  Hearing? Y  Comment Hearing aids  Vision? N  Difficulty concentrating or making decisions? Y  Comment Memory delay  Walking or climbing stairs? N  Dressing or bathing? N  Doing errands, shopping? N  Preparing Food and eating ? N  Using the Toilet? N  In the past six months, have you accidently leaked urine? N  Do you have problems with loss of bowel control? N  Managing your Medications? N  Managing your Finances? N  Housekeeping or managing your Housekeeping? N  Some recent data might be hidden    Patient Care Team: [name], MD as PCP - General (Family Medicine) Corey Skains, MD as Referring Physician (Internal Medicine) Manya Silvas, MD (Gastroenterology) Oneta Rack, MD (Dermatology)   Assessment:   This is a routine wellness examination for Jesus Peterson.  The goal of the wellness visit is to assist the  patient how to close the gaps in care and create a preventative care plan for the patient.   The roster of all physicians providing medical care to patient is listed in the Snapshot section of the chart.  Osteoporosis risk reviewed.    Safety issues reviewed; Smoke and carbon monoxide detectors in the home. No firearms in the home. Wears seatbelts when driving or riding with others. No violence in the home.  They do not have excessive sun  exposure.  Discussed the need for sun protection: hats, long sleeves and the use of sunscreen if there is significant sun exposure.  Patient is alert, normal appearance, oriented to person/place/and time.  Correctly identified the president of the [place] and recalls of 2/3 words. Performs simple calculations and can read correct time from watch face.  Displays appropriate judgement.  No new identified risk were noted.  No failures at ADL's or IADL's.    BMI- discussed the importance of a healthy diet, water intake and the benefits of aerobic exercise. He tries to have a healthy diet and adequate water intake. Plans to start exercising by riding his bike and doing strength training exercises in the gym.  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  Discussed the cardiac risk factor of a sedentary lifestyle.   24 hour diet recall: Low carb diet  Dental- every 6 months.  Eye- Visual acuity not assessed per patient preference since they have regular follow up with the ophthalmologist.  Wears corrective lenses.  Sleep patterns- Sleeps through the night with CPAP in use.   Discussed influenza vaccine.  He will receive through his place of employment and notify pcp upon completion.   TDAP vaccine deferred per patient preference.  Follow up with insurance.  Educational material provided.  Patient Concerns: None at this time. Follow up with PCP as needed.  Exercise Activities and Dietary recommendations Current Exercise Habits: The patient does not  participate in regular exercise at present  Goals    . Increase physical activity     Ride bike for exercise Strength training exercises       Fall Risk Fall Risk  06/27/2018 03/29/2018 06/26/2017 02/05/2017 02/11/2016  Falls in the past year? No No No No No   Depression Screen PHQ 2/9 Scores 06/27/2018 03/29/2018 06/26/2017 02/05/2017  PHQ - 2 Score 0 0 0 0  PHQ- 9 Score - - 0 -    Cognitive Function MMSE - Mini Mental State Exam 02/12/2015  Orientation to time 5  Orientation to Place 5  Registration 3  Attention/ Calculation 5  Recall 3  Language- name 2 objects 2  Language- repeat 1  Language- follow 3 step command 3  Language- read & follow direction 1  Write a sentence 1  Copy design 1  Total score 30     6CIT Screen 06/27/2018 06/26/2017  What Year? 0 points 0 points  What month? 0 points 0 points  What time? 0 points 0 points  Count back from 20 0 points 0 points  Months in reverse 0 points 0 points  Repeat phrase 0 points 0 points  Total Score 0 0    Immunization History  Administered Date(s) Administered  . Influenza Split 03/05/2011, 08/02/2012, 07/30/2014  . Influenza,inj,Quad PF,6+ Mos 07/20/2017  . Influenza-Unspecified 08/06/2015, 08/16/2016  . Pneumococcal Conjugate-13 11/07/2013  . Pneumococcal Polysaccharide-23 03/05/2011   Screening Tests Health Maintenance  Topic Date Due  . TETANUS/TDAP  04/02/1964  . INFLUENZA VACCINE  05/16/2018  . OPHTHALMOLOGY EXAM  06/12/2018  . FOOT EXAM  06/26/2018  . HEMOGLOBIN A1C  10/08/2018  . COLONOSCOPY  03/04/2020  . Hepatitis C Screening  Completed  . PNA vac Low Risk Adult  Completed      Plan:    End of life planning; Advance aging; Advanced directives discussed. Copy of current HCPOA/Living Will requested.    I have personally reviewed and noted the following in the patient's chart:   . Medical and social history .  Use of alcohol, tobacco or illicit drugs  . Current medications and  supplements . Functional ability and status . Nutritional status . Physical activity . Advanced directives . List of other physicians . Hospitalizations, surgeries, and ER visits in previous 12 months . Vitals . Screenings to include cognitive, depression, and falls . Referrals and appointments  In addition, I have reviewed and discussed with patient certain preventive protocols, quality metrics, and best practice recommendations. A written personalized care plan for preventive services as well as general preventive health recommendations were provided to patient.     Varney Biles, LPN  6/38/9373

## 2018-06-27 NOTE — Progress Notes (Signed)
Reviewed & agree with LPN note  No patient concerns at this time, follow up with PCP

## 2018-06-27 NOTE — Patient Instructions (Addendum)
  Mr. Drudge , Thank you for taking time to come for your Medicare Wellness Visit. I appreciate your ongoing commitment to your health goals. Please review the following plan we discussed and let me know if I can assist you in the future.   Follow up as needed.    Bring a copy of your [place] and/or Living Will to be scanned into chart.  Have a great day!  These are the goals we discussed: Goals    . Increase physical activity     Ride bike for exercise Strength training exercises       This is a list of the screening recommended for you and due dates:  Health Maintenance  Topic Date Due  . Tetanus Vaccine  04/02/1964  . Flu Shot  05/16/2018  . Eye exam for diabetics  06/12/2018  . Complete foot exam   06/26/2018  . Hemoglobin A1C  10/08/2018  . Colon Cancer Screening  03/04/2020  .  Hepatitis C: One time screening is recommended by Center for Disease Control  (CDC) for  adults born from [birth year] through 1966.   Completed  . Pneumonia vaccines  Completed

## 2018-07-02 ENCOUNTER — Ambulatory Visit: Payer: Medicare Other | Admitting: Family Medicine

## 2018-07-04 DIAGNOSIS — H52222 Regular astigmatism, left eye: Secondary | ICD-10-CM | POA: Diagnosis not present

## 2018-07-04 DIAGNOSIS — H40013 Open angle with borderline findings, low risk, bilateral: Secondary | ICD-10-CM | POA: Diagnosis not present

## 2018-07-04 DIAGNOSIS — H5203 Hypermetropia, bilateral: Secondary | ICD-10-CM | POA: Diagnosis not present

## 2018-07-04 DIAGNOSIS — H25813 Combined forms of age-related cataract, bilateral: Secondary | ICD-10-CM | POA: Diagnosis not present

## 2018-07-10 ENCOUNTER — Telehealth: Payer: Self-pay

## 2018-07-10 NOTE — Telephone Encounter (Signed)
Copied from [place] [phone]. Topic: General - Other >> Jul 10, 2018  3:46 PM Marin Olp L wrote: Reason for CRM: Patient would like a callback to discuss Losartan causing cancer news.

## 2018-07-11 ENCOUNTER — Other Ambulatory Visit: Payer: Self-pay | Admitting: Family Medicine

## 2018-07-11 ENCOUNTER — Telehealth: Payer: Self-pay

## 2018-07-11 NOTE — Telephone Encounter (Signed)
Patient called back said it is ok to leave message in my chart

## 2018-07-11 NOTE — Telephone Encounter (Signed)
Copied from [place] [phone]. Topic: General - Other >> Jul 11, 2018  1:55 PM Janace Aris A wrote: Reason for CRM: Patient called in wanting to speak to [place]. Says it's in regards to possible side effects from Losartan

## 2018-07-12 ENCOUNTER — Encounter: Payer: Self-pay | Admitting: Family Medicine

## 2018-07-12 NOTE — Telephone Encounter (Signed)
Sent pt a Mychart message discussing this issues.

## 2018-07-22 ENCOUNTER — Ambulatory Visit (INDEPENDENT_AMBULATORY_CARE_PROVIDER_SITE_OTHER): Payer: Medicare Other | Admitting: Family Medicine

## 2018-07-22 ENCOUNTER — Encounter: Payer: Self-pay | Admitting: Family Medicine

## 2018-07-22 VITALS — BP 140/90 | HR 66 | Temp 97.5°F | Ht 69.5 in | Wt 245.4 lb

## 2018-07-22 DIAGNOSIS — G4733 Obstructive sleep apnea (adult) (pediatric): Secondary | ICD-10-CM

## 2018-07-22 DIAGNOSIS — L219 Seborrheic dermatitis, unspecified: Secondary | ICD-10-CM | POA: Diagnosis not present

## 2018-07-22 DIAGNOSIS — D0339 Melanoma in situ of other parts of face: Secondary | ICD-10-CM

## 2018-07-22 DIAGNOSIS — E119 Type 2 diabetes mellitus without complications: Secondary | ICD-10-CM | POA: Diagnosis not present

## 2018-07-22 DIAGNOSIS — I10 Essential (primary) hypertension: Secondary | ICD-10-CM

## 2018-07-22 LAB — POCT GLYCOSYLATED HEMOGLOBIN (HGB A1C): Hemoglobin A1C: 7.1 % — AB (ref 4.0–5.6)

## 2018-07-22 NOTE — Assessment & Plan Note (Signed)
Adequately controlled at home.  Continue current regimen.

## 2018-07-22 NOTE — Assessment & Plan Note (Addendum)
Compliant with CPAP.  He will continue CPAP.  He does note it has been 5 years since he got his CPAP.  He is interested in switching companies.  I spoke with our referral coordinator to see if she can check into whether or not he will need an additional sleep study to switch companies.  She will look into this.

## 2018-07-22 NOTE — Progress Notes (Signed)
  Jesus Rumps, MD Phone: [phone]  Jesus Peterson is a 72 y.o. male who presents today for f/u.  CC: htn, dm, osa, seborrheic dermatitis  HYPERTENSION  Disease Monitoring  Home BP Monitoring 140/80s Chest pain- no    Dyspnea- no Medications  Compliance-  Taking amlodipine, coreg, losartan.  Edema- no  DIABETES Disease Monitoring: Blood Sugar ranges-not checking Polyuria/phagia/dipsia- no      Optho- saw recently, requesting records Medications: Compliance- no medication  Patient has worked on cutting down on rice flour intake.  Decreased sweets.  No exercise.  Seborrheic dermatitis: Has issues with this on his face.  He follows with dermatology.  Ketoconazole and hydrocortisone are somewhat beneficial.  OSA: Using his CPAP nightly for at least 5 hours.  He does wake up well rested.  No significant hypersomnia.  He does get up at 3:30 in the morning and is tired by the time he gets off of work at noon.    Social History   Tobacco Use  Smoking Status Never Smoker  Smokeless Tobacco Never Used     ROS see history of present illness  Objective  Physical Exam Vitals:   07/22/18 1506  BP: 140/90  Pulse: 66  Temp: (!) 97.5 F (36.4 C)  SpO2: 98%    BP Readings from Last 3 Encounters:  07/22/18 140/90  06/27/18 (!) 162/90  04/08/18 (!) 150/90   Wt Readings from Last 3 Encounters:  07/22/18 245 lb 6.4 oz (111.3 kg)  06/27/18 245 lb 12.8 oz (111.5 kg)  04/08/18 243 lb 12.8 oz (110.6 kg)    Physical Exam  Constitutional: No distress.  Cardiovascular: Normal rate, regular rhythm and normal heart sounds.  Pulmonary/Chest: Effort normal and breath sounds normal.  Musculoskeletal: He exhibits no edema.  Neurological: He is alert.  Skin: Skin is warm and dry. He is not diaphoretic.   Diabetic Foot Exam - Simple   Simple Foot Form Diabetic Foot exam was performed with the following findings:  Yes 07/22/2018  3:25 PM  Visual Inspection No deformities, no  ulcerations, no other skin breakdown bilaterally:  Yes Sensation Testing Intact to touch and monofilament testing bilaterally:  Yes Pulse Check Posterior Tibialis and Dorsalis pulse intact bilaterally:  Yes Comments      Assessment/Plan: Please see individual problem list.  Hypertension Adequately controlled at home.  Continue current regimen.  Obstructive sleep apnea Compliant with CPAP.  He will continue CPAP.  He does note it has been 5 years since he got his CPAP.  He is interested in switching companies.  I spoke with our referral coordinator to see if she can check into whether or not he will need an additional sleep study to switch companies.  She will look into this.  Diabetes type 2, controlled Check A1c.  Encourage diet and exercise.  Melanoma in situ of cheek ([place]) Status post removal with Mohs surgery.  Patient reports he follows with dermatology every 6 months for this.  Seborrheic dermatitis Encouraged patient to continue to follow with dermatology.   Orders Placed This Encounter  Procedures  . POCT HgB A1C    No orders of the defined types were placed in this encounter.    Jesus Rumps, MD [place]

## 2018-07-22 NOTE — Assessment & Plan Note (Signed)
Check A1c.  Encourage diet and exercise. 

## 2018-07-22 NOTE — Assessment & Plan Note (Addendum)
Status post removal with Mohs surgery.  Patient reports he follows with dermatology every 6 months for this.

## 2018-07-22 NOTE — Patient Instructions (Signed)
Nice to see you. We will check an A1c today and contact you with the results.

## 2018-07-22 NOTE — Assessment & Plan Note (Signed)
Encouraged patient to continue to follow with dermatology.

## 2018-07-28 ENCOUNTER — Telehealth: Payer: Self-pay | Admitting: Family Medicine

## 2018-07-28 DIAGNOSIS — G4733 Obstructive sleep apnea (adult) (pediatric): Secondary | ICD-10-CM

## 2018-07-28 NOTE — Telephone Encounter (Signed)
Please let the patient know that our referral coordinator checked in to his CPAP needs.  It appears he is going to need a new CPAP titration study.  I have placed an order for this and he will be contacted to get this scheduled.

## 2018-07-28 NOTE — Telephone Encounter (Signed)
-----   Message from Eustace Pen sent at 07/24/2018  2:17 PM EDT ----- Regarding: CPAP Dr. [name], I contacted Jeneen Rinks at [facility] to ask if he had the titration study in order to try to get his equipment supplied by [facility]. He states that he does not have the titration study because they do not do titrations-they do auto tritrating cpap.  Typically, if the study is 72 yrs old they do want a new study and it may be more beneficial since we will be sending it to a new DME company. I hope this helps. Thanks [employer]

## 2018-07-29 NOTE — Telephone Encounter (Signed)
Called and spoke with pt. Pt advised and voiced understanding.  

## 2018-08-05 NOTE — Telephone Encounter (Signed)
Copied from [place] [phone]. Topic: General - Other >> Aug 01, 2018  1:24 PM Bea Graff, NT wrote: Reason for CRM: Pt returning call to [place] about a sleep study. Please advise. >> Aug 02, 2018  9:57 AM Marcelene Butte, Rasheedah R wrote: I spoke with pt and will fax order to [facility] at home.  >> Aug 05, 2018  1:55 PM Selinda Flavin B, NT wrote: Patient calling and states that he would like to speak to [place]. States that the company that is supposed to do the Sleep Study at home has not reached out yet. States that he would like to hear from them within the next couple of days or he would have to go with someone else. Please advise. Would like a call back from [place]he[place].

## 2018-08-13 DIAGNOSIS — R0602 Shortness of breath: Secondary | ICD-10-CM | POA: Diagnosis not present

## 2018-08-13 DIAGNOSIS — G4733 Obstructive sleep apnea (adult) (pediatric): Secondary | ICD-10-CM | POA: Diagnosis not present

## 2018-08-14 DIAGNOSIS — R0602 Shortness of breath: Secondary | ICD-10-CM | POA: Diagnosis not present

## 2018-08-14 DIAGNOSIS — G4733 Obstructive sleep apnea (adult) (pediatric): Secondary | ICD-10-CM | POA: Diagnosis not present

## 2018-08-22 ENCOUNTER — Telehealth: Payer: Self-pay

## 2018-08-22 NOTE — Telephone Encounter (Signed)
Copied from [place] [phone]. Topic: General - Inquiry >> Aug 22, 2018 11:36 AM Reyne Dumas L wrote: Reason for CRM:   Pt calling to find out if the written RX for his CPAP machine is ready to be picked up.  *I see where there order was placed for this back on 10/13. Please advise?*

## 2018-08-23 NOTE — Telephone Encounter (Signed)
Dr. [name] did you write the Rx for this?  Thanks [place]

## 2018-08-25 NOTE — Telephone Encounter (Signed)
I have reviewed his sleep study.  I did get a prescription.  They recommended a CPAP titration study.  If he is willing to do that I can place an order for it.  Otherwise I can sign the prescription for the AutoPap.  Thanks.

## 2018-08-27 NOTE — Telephone Encounter (Signed)
Called and spoke with patient. Pt stated that he has already had the titration study done. Pt would like this signed and ready for pick up today if possible. Sent to PCP.

## 2018-08-27 NOTE — Telephone Encounter (Signed)
Noted.  I believe I placed this on your desk yesterday.  Please see if you have it.  Thanks.

## 2018-08-27 NOTE — Telephone Encounter (Signed)
Called patient and spoke with his wife. Wife stated that  He is at work and should be back home around 1 PM. Will try to call patient back again later.

## 2018-08-28 NOTE — Telephone Encounter (Signed)
Form was faxed yesterday. Sent to [facility] Glad please document since patient picked this up today thanks.

## 2018-09-03 ENCOUNTER — Telehealth: Payer: Self-pay

## 2018-09-03 NOTE — Telephone Encounter (Signed)
I have re faxed the pt's most resent OV. Sent to Jesus Peterson did you scan and make a copy of this to keep in patient's chart?

## 2018-09-03 NOTE — Telephone Encounter (Signed)
Copied from [place] [id number]. Topic: General - Other >> Sep 03, 2018  8:22 AM Janace Aris A wrote: Reason for CRM: Barbaraann Rondo from [facility] called in ,he says the form that was initially faxed over to him the date was missing from that, also he needs the last office visit note.

## 2018-09-04 NOTE — Telephone Encounter (Signed)
No, remember I made a copy to give to patient.

## 2018-09-04 NOTE — Telephone Encounter (Signed)
Sent to [facility] please document that form was faxed. Thanks

## 2018-09-04 NOTE — Telephone Encounter (Signed)
Jesus Peterson, with [facility] calling because the referral form for cpap machine is missing an order date. Fax: [phone]

## 2018-09-04 NOTE — Telephone Encounter (Signed)
Sent to Melissa Tuck 

## 2018-09-04 NOTE — Telephone Encounter (Signed)
Please advise 

## 2018-09-05 ENCOUNTER — Other Ambulatory Visit: Payer: Self-pay | Admitting: Family Medicine

## 2018-09-06 ENCOUNTER — Encounter: Payer: Self-pay | Admitting: *Deleted

## 2018-09-06 NOTE — Telephone Encounter (Signed)
Form was refaxed to [facility]

## 2018-09-09 ENCOUNTER — Encounter
Admission: RE | Disposition: A | Discharge status: Home / Self Care | Payer: Self-pay | Source: Ambulatory Visit | Attending: Gastroenterology

## 2018-09-09 ENCOUNTER — Ambulatory Visit: Payer: Medicare Other | Admitting: Anesthesiology

## 2018-09-09 ENCOUNTER — Encounter: Payer: Self-pay | Admitting: *Deleted

## 2018-09-09 ENCOUNTER — Ambulatory Visit
Admission: RE | Admit: 2018-09-09 | Discharge: 2018-09-09 | Disposition: A | Discharge status: Home / Self Care | Payer: Medicare Other | Source: Ambulatory Visit | Attending: Gastroenterology | Admitting: Gastroenterology

## 2018-09-09 DIAGNOSIS — D175 Benign lipomatous neoplasm of intra-abdominal organs: Secondary | ICD-10-CM | POA: Diagnosis not present

## 2018-09-09 DIAGNOSIS — Z8371 Family history of colonic polyps: Secondary | ICD-10-CM | POA: Insufficient documentation

## 2018-09-09 DIAGNOSIS — Z7984 Long term (current) use of oral hypoglycemic drugs: Secondary | ICD-10-CM | POA: Insufficient documentation

## 2018-09-09 DIAGNOSIS — K648 Other hemorrhoids: Secondary | ICD-10-CM | POA: Insufficient documentation

## 2018-09-09 DIAGNOSIS — D122 Benign neoplasm of ascending colon: Secondary | ICD-10-CM | POA: Insufficient documentation

## 2018-09-09 DIAGNOSIS — L219 Seborrheic dermatitis, unspecified: Secondary | ICD-10-CM | POA: Diagnosis not present

## 2018-09-09 DIAGNOSIS — K635 Polyp of colon: Secondary | ICD-10-CM | POA: Diagnosis not present

## 2018-09-09 DIAGNOSIS — E785 Hyperlipidemia, unspecified: Secondary | ICD-10-CM | POA: Insufficient documentation

## 2018-09-09 DIAGNOSIS — Z87442 Personal history of urinary calculi: Secondary | ICD-10-CM | POA: Diagnosis not present

## 2018-09-09 DIAGNOSIS — H9319 Tinnitus, unspecified ear: Secondary | ICD-10-CM | POA: Insufficient documentation

## 2018-09-09 DIAGNOSIS — Z1211 Encounter for screening for malignant neoplasm of colon: Secondary | ICD-10-CM | POA: Insufficient documentation

## 2018-09-09 DIAGNOSIS — K644 Residual hemorrhoidal skin tags: Secondary | ICD-10-CM | POA: Insufficient documentation

## 2018-09-09 DIAGNOSIS — E1122 Type 2 diabetes mellitus with diabetic chronic kidney disease: Secondary | ICD-10-CM | POA: Insufficient documentation

## 2018-09-09 DIAGNOSIS — K641 Second degree hemorrhoids: Secondary | ICD-10-CM | POA: Diagnosis not present

## 2018-09-09 DIAGNOSIS — Z86010 Personal history of colonic polyps: Secondary | ICD-10-CM | POA: Diagnosis not present

## 2018-09-09 DIAGNOSIS — Z79899 Other long term (current) drug therapy: Secondary | ICD-10-CM | POA: Diagnosis not present

## 2018-09-09 DIAGNOSIS — I129 Hypertensive chronic kidney disease with stage 1 through stage 4 chronic kidney disease, or unspecified chronic kidney disease: Secondary | ICD-10-CM | POA: Diagnosis not present

## 2018-09-09 DIAGNOSIS — Z91041 Radiographic dye allergy status: Secondary | ICD-10-CM | POA: Diagnosis not present

## 2018-09-09 DIAGNOSIS — Z880 Allergy status to penicillin: Secondary | ICD-10-CM | POA: Diagnosis not present

## 2018-09-09 DIAGNOSIS — I38 Endocarditis, valve unspecified: Secondary | ICD-10-CM | POA: Diagnosis not present

## 2018-09-09 DIAGNOSIS — D124 Benign neoplasm of descending colon: Secondary | ICD-10-CM | POA: Insufficient documentation

## 2018-09-09 DIAGNOSIS — D1779 Benign lipomatous neoplasm of other sites: Secondary | ICD-10-CM | POA: Diagnosis not present

## 2018-09-09 DIAGNOSIS — Z85820 Personal history of malignant melanoma of skin: Secondary | ICD-10-CM | POA: Insufficient documentation

## 2018-09-09 DIAGNOSIS — G4730 Sleep apnea, unspecified: Secondary | ICD-10-CM | POA: Insufficient documentation

## 2018-09-09 DIAGNOSIS — D126 Benign neoplasm of colon, unspecified: Secondary | ICD-10-CM | POA: Diagnosis not present

## 2018-09-09 DIAGNOSIS — N189 Chronic kidney disease, unspecified: Secondary | ICD-10-CM | POA: Diagnosis not present

## 2018-09-09 DIAGNOSIS — M1990 Unspecified osteoarthritis, unspecified site: Secondary | ICD-10-CM | POA: Diagnosis not present

## 2018-09-09 DIAGNOSIS — K5730 Diverticulosis of large intestine without perforation or abscess without bleeding: Secondary | ICD-10-CM | POA: Insufficient documentation

## 2018-09-09 HISTORY — DX: Chronic kidney disease, unspecified: N18.9

## 2018-09-09 HISTORY — DX: Personal history of urinary calculi: Z87.442

## 2018-09-09 HISTORY — DX: Malignant (primary) neoplasm, unspecified: C80.1

## 2018-09-09 HISTORY — PX: COLONOSCOPY WITH PROPOFOL: SHX5780

## 2018-09-09 HISTORY — DX: Endocarditis, valve unspecified: I38

## 2018-09-09 LAB — HM COLONOSCOPY

## 2018-09-09 SURGERY — COLONOSCOPY WITH PROPOFOL
Anesthesia: General

## 2018-09-09 MED ORDER — PROPOFOL 500 MG/50ML IV EMUL
INTRAVENOUS | Status: AC
  Filled 2018-09-09: qty 50

## 2018-09-09 MED ORDER — SODIUM CHLORIDE 0.9 % IV SOLN
INTRAVENOUS | Status: DC
  Administered 2018-09-09: 1000 mL via INTRAVENOUS

## 2018-09-09 MED ORDER — MIDAZOLAM HCL 2 MG/2ML IJ SOLN
INTRAMUSCULAR | Status: DC | PRN
  Administered 2018-09-09: 2 mg via INTRAVENOUS

## 2018-09-09 MED ORDER — PROPOFOL 500 MG/50ML IV EMUL
INTRAVENOUS | Status: DC | PRN
  Administered 2018-09-09: 120 ug/kg/min via INTRAVENOUS

## 2018-09-09 MED ORDER — FENTANYL CITRATE (PF) 100 MCG/2ML IJ SOLN
INTRAMUSCULAR | Status: DC | PRN
  Administered 2018-09-09: 50 ug via INTRAVENOUS

## 2018-09-09 MED ORDER — MIDAZOLAM HCL 2 MG/2ML IJ SOLN
INTRAMUSCULAR | Status: AC
  Filled 2018-09-09: qty 2

## 2018-09-09 MED ORDER — FENTANYL CITRATE (PF) 100 MCG/2ML IJ SOLN
INTRAMUSCULAR | Status: AC
  Filled 2018-09-09: qty 2

## 2018-09-09 NOTE — H&P (Signed)
Outpatient short stay form Pre-procedure 09/09/2018 9:42 AM Jesus Sails MD  Primary Physician: Dr. [name]  Reason for visit: Colonoscopy  History of present illness: Patient is a 72 year old male presenting today for colonoscopy in regards to his personal history of colon polyps and family history of colon polyps.  Patient's last colonoscopy was 2013 with a finding of a tubular adenoma at that time.  Patient tolerated his prep well.  He takes no aspirin or blood thinning agent.    Current Facility-Administered Medications:  .  0.9 %  sodium chloride infusion, , Intravenous, Continuous, Jesus Sails, MD, Last Rate: 20 mL/hr at 09/09/18 0916, 1,000 mL at 09/09/18 0916  Medications Prior to Admission  Medication Sig Dispense Refill Last Dose  . amLODipine (NORVASC) 10 MG tablet TAKE 1 TABLET EVERY DAY 90 tablet 2 09/08/2018 at Unknown time  . BAYER MICROLET LANCETS lancets USE  TWICE DAILY ICD 10 E11.9 100 each 11 09/08/2018 at Unknown time  . blood glucose meter kit and supplies KIT Dispense based on patient and insurance preference. Check once daily fasting, Dx Code E11.9 1 each 0 09/08/2018 at Unknown time  . Blood Pressure Monitoring (ADULT BLOOD PRESSURE CUFF LG) KIT Use daily to check blood pressure 1 each 0 09/08/2018 at Unknown time  . carvedilol (COREG) 12.5 MG tablet TAKE 1 TABLET TWICE DAILY WITH MEALS 180 tablet 2 09/08/2018 at Unknown time  . GARLIC OIL PO Take 1 capsule by mouth 4 (four) times daily.   Past Week at Unknown time  . glucose blood (BAYER CONTOUR NEXT TEST) test strip USE  STRIP TO CHECK GLUCOSE TWICE DAILY E11.9 100 each 11 09/08/2018 at Unknown time  . hydrocortisone 2.5 % cream Apply 1 application topically 2 (two) times daily.   Past Week at Unknown time  . Iodoquinol-HC-Aloe Polysacch (ALCORTIN A) 1-2-1 % GEL Apply 1 application topically daily.   Past Week at Unknown time  . ketoconazole (NIZORAL) 2 % cream Apply 1 application topically 2  (two) times daily.   Past Week at Unknown time  . losartan (COZAAR) 100 MG tablet TAKE 1 TABLET EVERY DAY 90 tablet 0 09/08/2018 at Unknown time  . simvastatin (ZOCOR) 40 MG tablet TAKE 1/2 TABLET EVERY EVENING 45 tablet 1 09/08/2018 at Unknown time  . metFORMIN (GLUCOPHAGE XR) 500 MG 24 hr tablet Take 1 tablet (500 mg total) by mouth daily with breakfast. (Patient not taking: Reported on 09/09/2018) 30 tablet 1 Not Taking at Unknown time     Allergies  Allergen Reactions  . Contrast Media [Iodinated Diagnostic Agents]   . Penicillins      Past Medical History:  Diagnosis Date  . Cancer (HCC)    MELANOMA OF SKIN  . Chronic kidney disease   . Erectile dysfunction   . History of kidney stones   . Hyperlipidemia   . Hypertension   . Seborrheic dermatitis   . Treadmill stress test negative for angina pectoris 2013   Dr. Nehemiah Massed, normal per pt  . Valvular heart disease   . Venous angioma of brain (HCC)    Pons, found 2/2 tinnitus, followed on MRI    Review of systems:      Physical Exam    Heart and lungs: Regular rate and rhythm without rub or gallop, lungs are bilaterally clear.    HEENT: Normocephalic atraumatic eyes are anicteric    Other:    Pertinant exam for procedure: Soft nontender nondistended bowel sounds positive normoactive.  Planned proceedures: Colonoscopy and indicated procedures. I have discussed the risks benefits and complications of procedures to include not limited to bleeding, infection, perforation and the risk of sedation and the patient wishes to proceed.    Jesus Sails, MD Gastroenterology 09/09/2018  9:42 AM

## 2018-09-09 NOTE — Op Note (Signed)
[facility] Gastroenterology Patient Name: Jesus Peterson Procedure Date: 09/09/2018 9:38 AM MRN: [id number] Account #: [id number] Date of Birth: 1945/10/19 Admit Type: Inpatient Age: 72 Room: [facility] ENDO ROOM 3 Gender: Male Note Status: Finalized Procedure:            Colonoscopy Indications:          Family history of colonic polyps in a first-degree                        relative, Personal history of colonic polyps Providers:            Lollie Sails, MD Referring MD:         [name]. [name] (Referring MD) Medicines:            Monitored Anesthesia Care Complications:        No immediate complications. Procedure:            Pre-Anesthesia Assessment:                       - ASA Grade Assessment: III - A patient with severe                        systemic disease.                       After obtaining informed consent, the colonoscope was                        passed under direct vision. Throughout the procedure,                        the patient's blood pressure, pulse, and oxygen                        saturations were monitored continuously. The                        Colonoscope was introduced through the anus and                        advanced to the the cecum, identified by appendiceal                        orifice and ileocecal valve. The colonoscopy was                        performed without difficulty. The patient tolerated the                        procedure well. The quality of the bowel preparation                        was good. The colonoscopy was performed without                        difficulty. Findings:      Many small and large-mouthed diverticula were found in the sigmoid colon       and descending colon.      A 4 mm polyp was found in the distal ascending colon. The polyp was  sessile. The polyp was removed with a cold biopsy forceps. Resection and       retrieval were complete.      A 2 mm polyp was found in the  proximal descending colon. The polyp was       sessile. The polyp was removed with a cold biopsy forceps. Resection and       retrieval were complete.      Non-bleeding external and internal hemorrhoids were found during       retroflexion, during perianal exam and during anoscopy. The hemorrhoids       were small and Grade II (internal hemorrhoids that prolapse but reduce       spontaneously).      The exam was otherwise without abnormality. Impression:           - Diverticulosis in the sigmoid colon and in the                        descending colon.                       - One 4 mm polyp in the distal ascending colon, removed                        with a cold biopsy forceps. Resected and retrieved.                       - One 2 mm polyp in the proximal descending colon,                        removed with a cold biopsy forceps. Resected and                        retrieved.                       - Non-bleeding external and internal hemorrhoids.                       - The examination was otherwise normal. Recommendation:       - Use Analpram HC Cream 2.5%: Apply externally TID for                        10 days. Lollie Sails, MD 09/09/2018 10:17:57 AM This report has been signed electronically. Number of Addenda: 0 Note Initiated On: 09/09/2018 9:38 AM Scope Withdrawal Time: 0 hours 7 minutes 6 seconds  Total Procedure Duration: 0 hours 16 minutes 3 seconds       [facility]

## 2018-09-09 NOTE — Anesthesia Post-op Follow-up Note (Signed)
Anesthesia QCDR form completed.        

## 2018-09-09 NOTE — Anesthesia Procedure Notes (Signed)
Performed by: Cook-Martin, Ali Mohl Pre-anesthesia Checklist: Patient identified, Emergency Drugs available, Suction available, Patient being monitored and Timeout performed Patient Re-evaluated:Patient Re-evaluated prior to induction Oxygen Delivery Method: Nasal cannula Preoxygenation: Pre-oxygenation with 100% oxygen Induction Type: IV induction Ventilation: Oral airway inserted - appropriate to patient size Placement Confirmation: positive ETCO2 and CO2 detector       

## 2018-09-09 NOTE — Transfer of Care (Signed)
Immediate Anesthesia Transfer of Care Note  Patient: Jesus Peterson  Procedure(s) Performed: COLONOSCOPY WITH PROPOFOL (N/A )  Patient Location: PACU  Anesthesia Type:General  Level of Consciousness: awake and sedated  Airway & Oxygen Therapy: Patient Spontanous Breathing and Patient connected to nasal cannula oxygen  Post-op Assessment: Report given to RN and Post -op Vital signs reviewed and stable  Post vital signs: Reviewed and stable  Last Vitals:  Vitals Value Taken Time  BP    Temp    Pulse    Resp    SpO2      Last Pain:  Vitals:   09/09/18 0856  TempSrc: Tympanic  PainSc: 0-No pain         Complications: No apparent anesthesia complications

## 2018-09-09 NOTE — Anesthesia Preprocedure Evaluation (Addendum)
Anesthesia Evaluation  Patient identified by MRN, date of birth, ID band Patient awake    Reviewed: Allergy & Precautions, H&P , NPO status , Patient's Chart, lab work & pertinent test results  Airway Mallampati: III       Dental  (+) Poor Dentition   Pulmonary sleep apnea , neg COPD,           Cardiovascular hypertension, (-) Past MI, (-) Cardiac Stents, (-) CABG and (-) CHF      Neuro/Psych negative neurological ROS  negative psych ROS   GI/Hepatic Neg liver ROS, GERD  ,  Endo/Other  diabetes  Renal/GU Renal disease  negative genitourinary   Musculoskeletal  (+) Arthritis ,   Abdominal   Peds  Hematology negative hematology ROS (+)   Anesthesia Other Findings Past Medical History: No date: Cancer ([place])     Comment:  MELANOMA OF SKIN No date: Chronic kidney disease No date: Erectile dysfunction No date: History of kidney stones No date: Hyperlipidemia No date: Hypertension No date: Seborrheic dermatitis 2013: Treadmill stress test negative for angina pectoris     Comment:  Dr. Nehemiah Massed, normal per pt No date: Valvular heart disease No date: Venous angioma of brain ([place])     Comment:  Pons, found 2/2 tinnitus, followed on MRI  Past Surgical History: 08/24/2006,08/23/2012: COLONOSCOPY  BMI    Body Mass Index:  34.15 kg/m      Reproductive/Obstetrics negative OB ROS                            Anesthesia Physical Anesthesia Plan  ASA: II  Anesthesia Plan: General   Post-op Pain Management:    Induction:   PONV Risk Score and Plan: Propofol infusion and TIVA  Airway Management Planned: Natural Airway and Nasal Cannula  Additional Equipment:   Intra-op Plan:   Post-operative Plan:   Informed Consent: I have reviewed the patients History and Physical, chart, labs and discussed the procedure including the risks, benefits and alternatives for the proposed anesthesia  with the patient or authorized representative who has indicated his/her understanding and acceptance.   Dental Advisory Given  Plan Discussed with: Anesthesiologist  Anesthesia Plan Comments:        Anesthesia Quick Evaluation

## 2018-09-10 ENCOUNTER — Encounter: Payer: Self-pay | Admitting: Gastroenterology

## 2018-09-10 LAB — SURGICAL PATHOLOGY

## 2018-09-10 NOTE — Anesthesia Postprocedure Evaluation (Signed)
Anesthesia Post Note  Patient: Jesus Peterson  Procedure(s) Performed: COLONOSCOPY WITH PROPOFOL (N/A )  Patient location during evaluation: PACU Anesthesia Type: General Level of consciousness: awake and alert Pain management: pain level controlled Vital Signs Assessment: post-procedure vital signs reviewed and stable Respiratory status: spontaneous breathing, nonlabored ventilation and respiratory function stable Cardiovascular status: blood pressure returned to baseline and stable Postop Assessment: no apparent nausea or vomiting Anesthetic complications: no     Last Vitals:  Vitals:   09/09/18 1038 09/09/18 1048  BP: 133/88 133/90  Pulse: 61 (!) 58  Resp: 17 16  Temp:    SpO2: 97% 97%    Last Pain:  Vitals:   09/10/18 0723  TempSrc:   PainSc: 0-No pain                 Durenda Hurt

## 2018-09-17 DIAGNOSIS — Z859 Personal history of malignant neoplasm, unspecified: Secondary | ICD-10-CM | POA: Diagnosis not present

## 2018-09-17 DIAGNOSIS — Z85820 Personal history of malignant melanoma of skin: Secondary | ICD-10-CM | POA: Diagnosis not present

## 2018-09-17 DIAGNOSIS — Z1283 Encounter for screening for malignant neoplasm of skin: Secondary | ICD-10-CM | POA: Diagnosis not present

## 2018-09-17 DIAGNOSIS — D3610 Benign neoplasm of peripheral nerves and autonomic nervous system, unspecified: Secondary | ICD-10-CM | POA: Diagnosis not present

## 2018-09-17 DIAGNOSIS — Z872 Personal history of diseases of the skin and subcutaneous tissue: Secondary | ICD-10-CM | POA: Diagnosis not present

## 2018-09-17 DIAGNOSIS — L720 Epidermal cyst: Secondary | ICD-10-CM | POA: Diagnosis not present

## 2018-09-17 DIAGNOSIS — L570 Actinic keratosis: Secondary | ICD-10-CM | POA: Diagnosis not present

## 2018-09-17 DIAGNOSIS — L578 Other skin changes due to chronic exposure to nonionizing radiation: Secondary | ICD-10-CM | POA: Diagnosis not present

## 2018-11-01 ENCOUNTER — Other Ambulatory Visit: Payer: Self-pay | Admitting: Family Medicine

## 2018-11-12 ENCOUNTER — Other Ambulatory Visit: Payer: Self-pay | Admitting: Family Medicine

## 2018-11-13 DIAGNOSIS — K648 Other hemorrhoids: Secondary | ICD-10-CM | POA: Diagnosis not present

## 2018-11-19 ENCOUNTER — Ambulatory Visit (INDEPENDENT_AMBULATORY_CARE_PROVIDER_SITE_OTHER): Payer: Medicare Other | Admitting: Family Medicine

## 2018-11-19 ENCOUNTER — Other Ambulatory Visit: Payer: Self-pay | Admitting: Family Medicine

## 2018-11-19 ENCOUNTER — Encounter: Payer: Self-pay | Admitting: Family Medicine

## 2018-11-19 VITALS — BP 138/90 | HR 66 | Temp 97.6°F | Ht 70.0 in | Wt 240.4 lb

## 2018-11-19 DIAGNOSIS — Z86010 Personal history of colonic polyps: Secondary | ICD-10-CM | POA: Diagnosis not present

## 2018-11-19 DIAGNOSIS — E119 Type 2 diabetes mellitus without complications: Secondary | ICD-10-CM | POA: Diagnosis not present

## 2018-11-19 DIAGNOSIS — H9193 Unspecified hearing loss, bilateral: Secondary | ICD-10-CM

## 2018-11-19 DIAGNOSIS — I10 Essential (primary) hypertension: Secondary | ICD-10-CM

## 2018-11-19 DIAGNOSIS — E785 Hyperlipidemia, unspecified: Secondary | ICD-10-CM | POA: Diagnosis not present

## 2018-11-19 LAB — COMPREHENSIVE METABOLIC PANEL
ALT: 26 U/L (ref 0–53)
AST: 16 U/L (ref 0–37)
Albumin: 4.4 g/dL (ref 3.5–5.2)
Alkaline Phosphatase: 65 U/L (ref 39–117)
BUN: 24 mg/dL — ABNORMAL HIGH (ref 6–23)
CO2: 24 mEq/L (ref 19–32)
Calcium: 9.4 mg/dL (ref 8.4–10.5)
Chloride: 104 mEq/L (ref 96–112)
Creatinine, Ser: 1.03 mg/dL (ref 0.40–1.50)
GFR: 70.67 mL/min (ref >60.00)
Glucose, Bld: 159 mg/dL — ABNORMAL HIGH (ref 70–99)
Potassium: 3.8 mEq/L (ref 3.5–5.1)
Sodium: 139 mEq/L (ref 135–145)
Total Bilirubin: 0.7 mg/dL (ref 0.2–1.2)
Total Protein: 7.2 g/dL (ref 6.0–8.3)

## 2018-11-19 LAB — HEMOGLOBIN A1C: Hgb A1c MFr Bld: 7.5 % — ABNORMAL HIGH (ref 4.6–6.5)

## 2018-11-19 MED ORDER — TETANUS-DIPHTH-ACELL PERTUSSIS 5-2.5-18.5 LF-MCG/0.5 IM SUSP: 0.5000 mL | Freq: Once | INTRAMUSCULAR | 0 refills | Status: AC

## 2018-11-19 NOTE — Assessment & Plan Note (Signed)
Continue simvastatin. 

## 2018-11-19 NOTE — Assessment & Plan Note (Signed)
Recent colonoscopy completed.  Health maintenance updated.  5-year recall.

## 2018-11-19 NOTE — Assessment & Plan Note (Signed)
Patient has been evaluated for this.  Encouraged him to wear his hearing aids.

## 2018-11-19 NOTE — Assessment & Plan Note (Signed)
Uncontrolled.  We will check a CMP and then consider adding HCTZ.  He will continue his current regimen.

## 2018-11-19 NOTE — Assessment & Plan Note (Signed)
Check A1c.  Consider adding Tradjenta or Jardiance once this returns.

## 2018-11-19 NOTE — Patient Instructions (Signed)
Nice to see you. We will check lab work today and contact you with the results.  We will likely be adding an additional blood pressure medication and diabetic medication.

## 2018-11-19 NOTE — Progress Notes (Signed)
  [name], MD Phone: [phone]  Jesus Peterson is a 73 y.o. male who presents today for f/u.  CC: dm, htn, hld  HYPERTENSION Disease Monitoring: Blood pressure range-138-150/80-90s Chest pain- no      Dyspnea- no Medications: Compliance-taking carvedilol, amlodipine, losartan    Edema- no  DIABETES Disease Monitoring: Blood Sugar ranges-170 avg Polyuria/phagia/dipsia- no      Optho- reports he saw last year Medications: Compliance-no medications, metformin made him sick hypoglycemic symptoms- no  HYPERLIPIDEMIA Disease Monitoring: See symptoms for Hypertension Medications: Compliance-taking simvastatin right upper quadrant pain- no  Muscle aches- no  Hearing difficulty: Patient has had hearing difficulty for some time.  He has chronic tinnitus.  He saw ENT locally and at the [place] he saw an audiologist.  He does have hearing aids but does not wear them.  He had a colonoscopy recently that revealed tubular adenomas.  5-year recall.    Social History   Tobacco Use  Smoking Status Never Smoker  Smokeless Tobacco Never Used     ROS see history of present illness  Objective  Physical Exam Vitals:   11/19/18 0810  BP: 138/90  Pulse: 66  Temp: 97.6 F (36.4 C)  SpO2: 97%    BP Readings from Last 3 Encounters:  11/19/18 138/90  09/09/18 133/90  07/22/18 140/90   Wt Readings from Last 3 Encounters:  11/19/18 240 lb 6.4 oz (109 kg)  09/09/18 238 lb (108 kg)  07/22/18 245 lb 6.4 oz (111.3 kg)    Physical Exam Constitutional:      General: He is not in acute distress.    Appearance: He is not diaphoretic.  Cardiovascular:     Rate and Rhythm: Normal rate and regular rhythm.     Heart sounds: Normal heart sounds.  Pulmonary:     Effort: Pulmonary effort is normal.     Breath sounds: Normal breath sounds.  Musculoskeletal:     Right lower leg: No edema.     Left lower leg: No edema.  Skin:    General: Skin is warm and dry.  Neurological:   Mental Status: He is alert.      Assessment/Plan: Please see individual problem list.  Hypertension Uncontrolled.  We will check a CMP and then consider adding HCTZ.  He will continue his current regimen.  Diabetes type 2, controlled Check A1c.  Consider adding Tradjenta or Jardiance once this returns.  Hyperlipidemia Continue simvastatin.  Hearing difficulty of both ears Patient has been evaluated for this.  Encouraged him to wear his hearing aids.  History of colon polyps Recent colonoscopy completed.  Health maintenance updated.  5-year recall.   Health Maintenance: Encouraged patient to get a tetanus vaccine at the pharmacy.  Prescription sent to pharmacy.  We will request ophthalmology records.  Orders Placed This Encounter  Procedures  . Comp Met (CMET)  . HgB A1c  . Urine Microalbumin w/creat. ratio    Meds ordered this encounter  Medications  . Tdap (BOOSTRIX) 5-2.5-18.5 LF-MCG/0.5 injection    Sig: Inject 0.5 mLs into the muscle once for 1 dose.    Dispense:  0.5 mL    Refill:  0     [name], MD [place]

## 2018-11-19 NOTE — Addendum Note (Signed)
Addended by: Arby Barrette on: 11/19/2018 09:01 AM   Modules accepted: Orders

## 2018-11-20 ENCOUNTER — Other Ambulatory Visit: Payer: Self-pay | Admitting: Family Medicine

## 2018-11-20 MED ORDER — EMPAGLIFLOZIN 10 MG PO TABS: 10.0000 mg | ORAL_TABLET | Freq: Every day | ORAL | 1 refills | Status: DC

## 2018-11-22 ENCOUNTER — Ambulatory Visit: Payer: Medicare Other | Admitting: Family Medicine

## 2018-11-25 ENCOUNTER — Other Ambulatory Visit: Payer: Self-pay | Admitting: Family Medicine

## 2018-11-25 NOTE — Telephone Encounter (Signed)
Copied from [place] [id number]. Topic: Quick Communication - Rx Refill/Question >> Nov 25, 2018 10:07 AM Nils Flack wrote: Medication: Jardiance   Has the patient contacted their pharmacy? Yes.   (Agent: If no, request that the patient contact the pharmacy for the refill.) (Agent: If yes, when and what did the pharmacy advise?)  Preferred Pharmacy (with phone number or street name): [facility]  Ins will only pay for 30 day supply  Cb is [phone]   Agent: Please be advised that RX refills may take up to 3 business days. We ask that you follow-up with your pharmacy.

## 2018-12-11 ENCOUNTER — Other Ambulatory Visit: Payer: Self-pay | Admitting: Family Medicine

## 2018-12-18 ENCOUNTER — Ambulatory Visit: Payer: Medicare Other

## 2018-12-19 ENCOUNTER — Ambulatory Visit: Payer: Medicare Other | Admitting: Dietician

## 2018-12-21 ENCOUNTER — Other Ambulatory Visit: Payer: Self-pay | Admitting: Family Medicine

## 2018-12-24 DIAGNOSIS — K136 Irritative hyperplasia of oral mucosa: Secondary | ICD-10-CM | POA: Diagnosis not present

## 2018-12-24 DIAGNOSIS — L980 Pyogenic granuloma: Secondary | ICD-10-CM | POA: Diagnosis not present

## 2018-12-26 DIAGNOSIS — M7022 Olecranon bursitis, left elbow: Secondary | ICD-10-CM | POA: Diagnosis not present

## 2019-01-17 ENCOUNTER — Other Ambulatory Visit: Payer: Self-pay

## 2019-01-17 MED ORDER — LOSARTAN POTASSIUM 100 MG PO TABS: 100.0000 mg | ORAL_TABLET | Freq: Every day | ORAL | 0 refills | Status: DC

## 2019-01-30 ENCOUNTER — Telehealth: Payer: Self-pay

## 2019-01-30 NOTE — Telephone Encounter (Signed)
Copied from [place] [phone]. Topic: Appointment Scheduling - Scheduling Inquiry for Clinic >> Jan 29, 2019  4:04 PM Reyne Dumas L wrote: Reason for CRM:   Pt called and wants to know what is going to happen to his June appointment.

## 2019-02-11 DIAGNOSIS — K136 Irritative hyperplasia of oral mucosa: Secondary | ICD-10-CM | POA: Diagnosis not present

## 2019-02-11 DIAGNOSIS — K1321 Leukoplakia of oral mucosa, including tongue: Secondary | ICD-10-CM | POA: Diagnosis not present

## 2019-03-18 ENCOUNTER — Telehealth: Payer: Self-pay | Admitting: *Deleted

## 2019-03-18 NOTE — Telephone Encounter (Signed)
Copied from [place] [id number]. Topic: Quick Communication - Appointment Cancellation >> Mar 18, 2019  9:22 AM Rainey Pines A wrote: Patient called to cancel appointment scheduled for 03/21/2019. Patient stated that he will callback to reschedule.

## 2019-03-19 DIAGNOSIS — L218 Other seborrheic dermatitis: Secondary | ICD-10-CM | POA: Diagnosis not present

## 2019-03-19 DIAGNOSIS — L570 Actinic keratosis: Secondary | ICD-10-CM | POA: Diagnosis not present

## 2019-03-19 DIAGNOSIS — Z85828 Personal history of other malignant neoplasm of skin: Secondary | ICD-10-CM | POA: Diagnosis not present

## 2019-03-21 ENCOUNTER — Other Ambulatory Visit: Payer: Self-pay

## 2019-03-21 ENCOUNTER — Encounter: Payer: Self-pay | Admitting: Family Medicine

## 2019-03-21 ENCOUNTER — Ambulatory Visit (INDEPENDENT_AMBULATORY_CARE_PROVIDER_SITE_OTHER): Payer: Medicare Other | Admitting: Family Medicine

## 2019-03-21 ENCOUNTER — Ambulatory Visit: Payer: Medicare Other | Admitting: Family Medicine

## 2019-03-21 DIAGNOSIS — E785 Hyperlipidemia, unspecified: Secondary | ICD-10-CM | POA: Diagnosis not present

## 2019-03-21 DIAGNOSIS — D0339 Melanoma in situ of other parts of face: Secondary | ICD-10-CM | POA: Diagnosis not present

## 2019-03-21 DIAGNOSIS — E119 Type 2 diabetes mellitus without complications: Secondary | ICD-10-CM

## 2019-03-21 DIAGNOSIS — I10 Essential (primary) hypertension: Secondary | ICD-10-CM | POA: Diagnosis not present

## 2019-03-21 MED ORDER — CARVEDILOL 12.5 MG PO TABS: 18.7500 mg | ORAL_TABLET | Freq: Two times a day (BID) | ORAL | 2 refills | Status: DC

## 2019-03-21 NOTE — Assessment & Plan Note (Addendum)
Controlled based on home CBGs.  Continue current medication.  Check A1c.

## 2019-03-21 NOTE — Assessment & Plan Note (Signed)
Slightly above goal.  We will increase his carvedilol dose.  He will monitor his pulse and fatigue with this increased dose.  He will monitor his blood pressure and send [place] readings in 1 to 2 weeks after starting on the dose of carvedilol.  He will continue his amlodipine and losartan as well.  He will come in for labs in the next month.

## 2019-03-21 NOTE — Progress Notes (Signed)
Virtual Visit via telephone note  This visit type was conducted due to national recommendations for restrictions regarding the COVID-19 pandemic (e.g. social distancing).  This format is felt to be most appropriate for this patient at this time.  All issues noted in this document were discussed and addressed.  No physical exam was performed (except for noted visual exam findings with Video Visits).   I connected with Jesus Peterson today at  9:00 AM EDT by telephone and verified that I am speaking with the correct person using two identifiers. Location patient: home Location provider: work Persons participating in the virtual visit: patient, provider  I discussed the limitations, risks, security and privacy concerns of performing an evaluation and management service by telephone and the availability of in person appointments. I also discussed with the patient that there may be a patient responsible charge related to this service. The patient expressed understanding and agreed to proceed.  Interactive audio and video telecommunications were attempted between this provider and patient, however failed, due to patient having technical difficulties OR patient did not have access to video capability.  We completed 8 minutes of the visit through video and the patient had sound difficulties on his end.  We continued and completed visit with audio only.    Reason for visit: follow-up  HPI: HYPERTENSION  Disease Monitoring  Home BP Monitoring typically 135-144/70s chest pain-no    dyspnea- no Medications  Compliance-taking amlodipine, carvedilol, losartan.  Edema- no  HYPERLIPIDEMIA Symptoms Chest pain on exertion:  no    Medications: Compliance- taking simvastatin Right upper quadrant pain- no  Muscle aches- no  DIABETES Disease Monitoring: Blood Sugar ranges-135 Polyuria/phagia/dipsia- no      Optho- due Medications: Compliance- taking jardiance Hypoglycemic symptoms- no  History of  melanoma: Patient saw dermatology yesterday and they did cryotherapy on several spots on his head and arms. No new melanoma lesions. He wants to see them every 6 months.    ROS: See pertinent positives and negatives per HPI.  Past Medical History:  Diagnosis Date  . Cancer (HCC)    MELANOMA OF SKIN  . Chronic kidney disease   . Erectile dysfunction   . History of kidney stones   . Hyperlipidemia   . Hypertension   . Seborrheic dermatitis   . Treadmill stress test negative for angina pectoris 2013   Dr. Nehemiah Massed, normal per pt  . Valvular heart disease   . Venous angioma of brain (HCC)    Pons, found 2/2 tinnitus, followed on MRI    Past Surgical History:  Procedure Laterality Date  . COLONOSCOPY  08/24/2006,08/23/2012  . COLONOSCOPY WITH PROPOFOL N/A 09/09/2018   Procedure: COLONOSCOPY WITH PROPOFOL;  Surgeon: Lollie Sails, MD;  Location: [facility] ENDOSCOPY;  Service: Endoscopy;  Laterality: N/A;    Family History  Problem Relation Age of Onset  . Stroke Mother   . Heart disease Mother        s/p CABG  . Heart disease Father   . Heart disease Brother        s/p CABG  . Heart disease Brother        s/p angioplasty  . Arthritis Brother     SOCIAL HX: nonsmoker   Current Outpatient Medications:  .  amLODipine (NORVASC) 10 MG tablet, TAKE 1 TABLET EVERY DAY, Disp: 90 tablet, Rfl: 2 .  BAYER MICROLET LANCETS lancets, USE  TWICE DAILY ICD 10 E11.9, Disp: 100 each, Rfl: 11 .  blood glucose meter kit and  supplies KIT, Dispense based on patient and insurance preference. Check once daily fasting, Dx Code E11.9, Disp: 1 each, Rfl: 0 .  Blood Pressure Monitoring (ADULT BLOOD PRESSURE CUFF LG) KIT, Use daily to check blood pressure, Disp: 1 each, Rfl: 0 .  carvedilol (COREG) 12.5 MG tablet, Take 1.5 tablets (18.75 mg total) by mouth 2 (two) times daily with a meal., Disp: 270 tablet, Rfl: 2 .  empagliflozin (JARDIANCE) 10 MG TABS tablet, Take 10 mg by mouth daily., Disp: 90  tablet, Rfl: 1 .  GARLIC OIL PO, Take 1 capsule by mouth 4 (four) times daily., Disp: , Rfl:  .  glucose blood (BAYER CONTOUR NEXT TEST) test strip, USE  STRIP TO CHECK GLUCOSE TWICE DAILY E11.9, Disp: 100 each, Rfl: 11 .  hydrocortisone 2.5 % cream, Apply 1 application topically 2 (two) times daily., Disp: , Rfl:  .  Iodoquinol-HC-Aloe Polysacch (ALCORTIN A) 1-2-1 % GEL, Apply 1 application topically daily., Disp: , Rfl:  .  ketoconazole (NIZORAL) 2 % cream, Apply 1 application topically 2 (two) times daily., Disp: , Rfl:  .  losartan (COZAAR) 100 MG tablet, Take 1 tablet (100 mg total) by mouth daily., Disp: 90 tablet, Rfl: 0 .  simvastatin (ZOCOR) 40 MG tablet, TAKE 1/2 TABLET EVERY EVENING, Disp: 45 tablet, Rfl: 1  EXAM:  VITALS per patient if applicable: Pulse 74  GENERAL: alert, oriented, appears well and in no acute distress  HEENT: atraumatic, conjunttiva clear, no obvious abnormalities on inspection of external nose and ears  NECK: normal movements of the head and neck  LUNGS: on inspection no signs of respiratory distress, breathing rate appears normal, no obvious gross SOB, gasping or wheezing  CV: no obvious cyanosis  MS: moves all visible extremities without noticeable abnormality  PSYCH/NEURO: pleasant and cooperative, no obvious depression or anxiety, speech and thought processing grossly intact  ASSESSMENT AND PLAN:  Discussed the following assessment and plan:  Controlled type 2 diabetes mellitus without complication, without long-term current use of insulin (HCC)  Essential hypertension  Melanoma in situ of cheek (HCC)  Hyperlipidemia, unspecified hyperlipidemia type  Diabetes type 2, controlled Controlled based on home CBGs.  Continue current medication.  Check A1c.  Hypertension Slightly above goal.  We will increase his carvedilol dose.  He will monitor his pulse and fatigue with this increased dose.  He will monitor his blood pressure and send [place]  readings in 1 to 2 weeks after starting on the dose of carvedilol.  He will continue his amlodipine and losartan as well.  He will come in for labs in the next month.  Melanoma in situ of cheek [facility]) He will continue to see dermatology.  He will contact them to arrange 84-monthfollow-up.  Hyperlipidemia Continue simvastatin.  [place] to get him scheduled for labs in about a month and follow-up in 3 months.  Social distancing precautions and sick precautions given regarding COVID-19.   I discussed the assessment and treatment plan with the patient. The patient was provided an opportunity to ask questions and all were answered. The patient agreed with the plan and demonstrated an understanding of the instructions.   The patient was advised to call back or seek an in-person evaluation if the symptoms worsen or if the condition fails to improve as anticipated.  I provided 28 minutes of non-face-to-face time during this encounter.   ETommi Rumps MD

## 2019-03-21 NOTE — Assessment & Plan Note (Signed)
Continue simvastatin. 

## 2019-03-21 NOTE — Assessment & Plan Note (Signed)
He will continue to see dermatology.  He will contact them to arrange 54-month follow-up.

## 2019-04-09 DIAGNOSIS — L853 Xerosis cutis: Secondary | ICD-10-CM | POA: Diagnosis not present

## 2019-04-09 DIAGNOSIS — L570 Actinic keratosis: Secondary | ICD-10-CM | POA: Diagnosis not present

## 2019-05-28 ENCOUNTER — Other Ambulatory Visit: Payer: Self-pay | Admitting: Family Medicine

## 2019-05-28 MED ORDER — BAYER MICROLET LANCETS MISC: 11 refills | Status: DC

## 2019-05-28 NOTE — Telephone Encounter (Signed)
Medication Refill - Medication: BAYER MICROLET LANCETS lancets  Has the patient contacted their pharmacy? Yes.   (Agent: If no, request that the patient contact the pharmacy for the refill.) (Agent: If yes, when and what did the pharmacy advise?)  Preferred Pharmacy (with phone number or street name):  [facility] [address] [place] - [place] [phone] (Phone) [phone] (Fax)      Agent: Please be advised that RX refills may take up to 3 business days. We ask that you follow-up with your pharmacy.

## 2019-05-28 NOTE — Telephone Encounter (Signed)
Refill sent.

## 2019-07-16 DIAGNOSIS — L738 Other specified follicular disorders: Secondary | ICD-10-CM | POA: Diagnosis not present

## 2019-07-17 DIAGNOSIS — Z23 Encounter for immunization: Secondary | ICD-10-CM | POA: Diagnosis not present

## 2019-07-23 ENCOUNTER — Ambulatory Visit (INDEPENDENT_AMBULATORY_CARE_PROVIDER_SITE_OTHER): Payer: Medicare Other

## 2019-07-23 ENCOUNTER — Other Ambulatory Visit: Payer: Self-pay

## 2019-07-23 ENCOUNTER — Ambulatory Visit (INDEPENDENT_AMBULATORY_CARE_PROVIDER_SITE_OTHER): Payer: Medicare Other | Admitting: Family Medicine

## 2019-07-23 ENCOUNTER — Encounter: Payer: Self-pay | Admitting: Family Medicine

## 2019-07-23 VITALS — Ht 71.0 in | Wt 234.0 lb

## 2019-07-23 DIAGNOSIS — R05 Cough: Secondary | ICD-10-CM | POA: Diagnosis not present

## 2019-07-23 DIAGNOSIS — R053 Chronic cough: Secondary | ICD-10-CM | POA: Insufficient documentation

## 2019-07-23 DIAGNOSIS — E119 Type 2 diabetes mellitus without complications: Secondary | ICD-10-CM | POA: Diagnosis not present

## 2019-07-23 DIAGNOSIS — L089 Local infection of the skin and subcutaneous tissue, unspecified: Secondary | ICD-10-CM | POA: Insufficient documentation

## 2019-07-23 DIAGNOSIS — I10 Essential (primary) hypertension: Secondary | ICD-10-CM

## 2019-07-23 DIAGNOSIS — Z0000 Encounter for general adult medical examination without abnormal findings: Secondary | ICD-10-CM | POA: Diagnosis not present

## 2019-07-23 NOTE — Assessment & Plan Note (Addendum)
Chronic mild cough in the morning.  No other associated symptoms.  We will check a chest x-ray to evaluate for underlying cause.  Given return precautions.

## 2019-07-23 NOTE — Assessment & Plan Note (Signed)
Relatively well controlled.  He will continue his current regimen.  Lab work to be completed at the medical mall at the [facility].

## 2019-07-23 NOTE — Progress Notes (Signed)
Virtual Visit via telephone Note  This visit type was conducted due to national recommendations for restrictions regarding the COVID-19 pandemic (e.g. social distancing).  This format is felt to be most appropriate for this patient at this time.  All issues noted in this document were discussed and addressed.  No physical exam was performed (except for noted visual exam findings with Video Visits).   I connected with Jesus Peterson today at  3:30 PM EDT by a video enabled telemedicine application or telephone and verified that I am speaking with the correct person using two identifiers. Location patient: home Location provider: work Persons participating in the virtual visit: patient, provider  I discussed the limitations, risks, security and privacy concerns of performing an evaluation and management service by telephone and the availability of in person appointments. I also discussed with the patient that there may be a patient responsible charge related to this service. The patient expressed understanding and agreed to proceed.  Interactive audio and video telecommunications were attempted between this provider and patient, however failed, due to patient having technical difficulties OR patient did not have access to video capability.  We continued and completed visit with audio only.  Reason for visit: follow-up  HPI: Hypertension: Typically running 130s over 57s.  Highest is 383 systolically.  Taking losartan, carvedilol, and amlodipine.  No chest pain, shortness of breath, or edema.  Diabetes: Typically running around 130.  No longer on medication.  No polyuria or polydipsia.  Chronic cough: Patient notes this has been going on for at least 3 months.  Typically only occurs in the morning when he gets up.  Improved significantly after he gets up.  No rhinorrhea, sneezing, reflux, sour taste in his mouth, or burning.  No postnasal drip.  No fevers.  Skin infection: Patient reports he saw  his dermatologist for some itching on the back of his scalp and they noted there were some areas of infection.  He states they placed him on Keflex for 30 days.   ROS: See pertinent positives and negatives per HPI.  Past Medical History:  Diagnosis Date  . Cancer (HCC)    MELANOMA OF SKIN  . Chronic kidney disease   . Erectile dysfunction   . History of kidney stones   . Hyperlipidemia   . Hypertension   . Seborrheic dermatitis   . Treadmill stress test negative for angina pectoris 2013   Dr. Nehemiah Massed, normal per pt  . Valvular heart disease   . Venous angioma of brain (HCC)    Pons, found 2/2 tinnitus, followed on MRI    Past Surgical History:  Procedure Laterality Date  . COLONOSCOPY  08/24/2006,08/23/2012  . COLONOSCOPY WITH PROPOFOL N/A 09/09/2018   Procedure: COLONOSCOPY WITH PROPOFOL;  Surgeon: Lollie Sails, MD;  Location: [facility] ENDOSCOPY;  Service: Endoscopy;  Laterality: N/A;    Family History  Problem Relation Age of Onset  . Stroke Mother   . Heart disease Mother        s/p CABG  . Heart disease Father   . Heart disease Brother        s/p CABG  . Heart disease Brother        s/p angioplasty  . Arthritis Brother     SOCIAL HX: Non-smoker.   Current Outpatient Medications:  .  amLODipine (NORVASC) 10 MG tablet, TAKE 1 TABLET EVERY DAY, Disp: 90 tablet, Rfl: 2 .  Bayer Microlet Lancets lancets, USE  TWICE DAILY ICD 10 E11.9, Disp: 100  each, Rfl: 11 .  blood glucose meter kit and supplies KIT, Dispense based on patient and insurance preference. Check once daily fasting, Dx Code E11.9, Disp: 1 each, Rfl: 0 .  Blood Pressure Monitoring (ADULT BLOOD PRESSURE CUFF LG) KIT, Use daily to check blood pressure, Disp: 1 each, Rfl: 0 .  carvedilol (COREG) 12.5 MG tablet, Take 1.5 tablets (18.75 mg total) by mouth 2 (two) times daily with a meal., Disp: 270 tablet, Rfl: 2 .  cephALEXin (KEFLEX) 500 MG capsule, Take 500 mg by mouth 2 (two) times daily., Disp: , Rfl:   .  GARLIC OIL PO, Take 1 capsule by mouth 4 (four) times daily., Disp: , Rfl:  .  glucose blood (BAYER CONTOUR NEXT TEST) test strip, USE  STRIP TO CHECK GLUCOSE TWICE DAILY E11.9, Disp: 100 each, Rfl: 11 .  hydrocortisone 2.5 % cream, Apply 1 application topically 2 (two) times daily., Disp: , Rfl:  .  Iodoquinol-HC-Aloe Polysacch (ALCORTIN A) 1-2-1 % GEL, Apply 1 application topically daily., Disp: , Rfl:  .  ketoconazole (NIZORAL) 2 % cream, Apply 1 application topically 2 (two) times daily., Disp: , Rfl:  .  losartan (COZAAR) 100 MG tablet, Take 1 tablet (100 mg total) by mouth daily., Disp: 90 tablet, Rfl: 0 .  simvastatin (ZOCOR) 40 MG tablet, TAKE 1/2 TABLET EVERY EVENING, Disp: 45 tablet, Rfl: 1  EXAM: This was a telehealth telephone visit and thus no physical exam was completed.  ASSESSMENT AND PLAN:  Discussed the following assessment and plan:  Hypertension Relatively well controlled.  He will continue his current regimen.  Lab work to be completed at the medical mall at the hospital.  Diabetes type 2, controlled Check A1c.  Chronic cough Chronic mild cough in the morning.  No other associated symptoms.  We will check a chest x-ray to evaluate for underlying cause.  Given return precautions.  Skin infection I advised the patient to contact his dermatologist just to confirm that they wanted him on 30 days of antibiotics.  Discussed that with prolonged durations of antibiotics there are risks of antibiotic resistance and bowel infections.    I discussed the assessment and treatment plan with the patient. The patient was provided an opportunity to ask questions and all were answered. The patient agreed with the plan and demonstrated an understanding of the instructions.   The patient was advised to call back or seek an in-person evaluation if the symptoms worsen or if the condition fails to improve as anticipated.  I provided 12 minutes of non-face-to-face time during this  encounter.   [name], MD

## 2019-07-23 NOTE — Patient Instructions (Addendum)
  Mr. Jesus Peterson , Thank you for taking time to come for your Medicare Wellness Visit. I appreciate your ongoing commitment to your health goals. Please review the following plan we discussed and let me know if I can assist you in the future.   These are the goals we discussed: Goals    . Increase physical activity     Ride bike for exercise Strength training exercises       This is a list of the screening recommended for you and due dates:  Health Maintenance  Topic Date Due  . Tetanus Vaccine  04[birth year]02/1965  . Eye exam for diabetics  06/12/2018  . Flu Shot  05/17/2019  . Hemoglobin A1C  05/20/2019  . Complete foot exam   07/23/2019  . Colon Cancer Screening  09/10/2023  .  Hepatitis C: One time screening is recommended by Center for Disease Control  (CDC) for  adults born from [birth year] through 1966.   Completed  . Pneumonia vaccines  Completed

## 2019-07-23 NOTE — Progress Notes (Signed)
Subjective:   Jesus Peterson is a 73 y.o. male who presents for Medicare Annual/Subsequent preventive examination.  Review of Systems:  No ROS.  Medicare Wellness Virtual Visit.  Visual/audio telehealth visit, UTA vital signs.   See social history for additional risk factors.  Cardiac Risk Factors include: advanced age (>65mn, >>12women);male gender;hypertension;diabetes mellitus     Objective:    Vitals: There were no vitals taken for this visit.  There is no height or weight on file to calculate BMI.  Advanced Directives 07/23/2019 09/09/2018 06/27/2018 06/26/2017 02/05/2017 02/11/2016 02/12/2015  Does Patient Have a Medical Advance Directive? _0  No Yes  Type of [place] - Living will Living will Living will - [place]  Does patient want to make changes to medical advance directive? No - Patient declined - No - Patient declined No - Patient declined No - Patient declined - No - Patient declined  Copy of [place] Chart? No - copy requested - - - - - No - copy requested    Tobacco Social History   Tobacco Use  Smoking Status Never Smoker  Smokeless Tobacco Never Used     Counseling given: Not Answered   Clinical Intake:  Pre-visit preparation completed: Yes        Diabetes: Yes  How often do you need to have someone help you when you read instructions, pamphlets, or other written materials from your doctor or pharmacy?: 1 - Never  Interpreter Needed?: No     Past Medical History:  Diagnosis Date  . Cancer (HCC)    MELANOMA OF SKIN  . Chronic kidney disease   . Erectile dysfunction   . History of kidney stones   . Hyperlipidemia   . Hypertension   . Seborrheic dermatitis   . Treadmill stress test negative for angina pectoris 2013   Dr. KNehemiah Massed normal per pt  . Valvular heart disease   . Venous angioma of brain (HCC)    Pons, found 2/2  tinnitus, followed on MRI   Past Surgical History:  Procedure Laterality Date  . COLONOSCOPY  08/24/2006,08/23/2012  . COLONOSCOPY WITH PROPOFOL N/A 09/09/2018   Procedure: COLONOSCOPY WITH PROPOFOL;  Surgeon: SLollie Sails MD;  [facility];  Service: Endoscopy;  Laterality: N/A;   Family History  Problem Relation Age of Onset  . Stroke Mother   . Heart disease Mother        s/p CABG  . Heart disease Father   . Heart disease Brother        s/p CABG  . Heart disease Brother        s/p angioplasty  . Arthritis Brother    Social History   Socioeconomic History  . Marital status: Married    Spouse name: Not on file  . Number of children: Not on file  . Years of education: Not on file  . Highest education level: Not on file  Occupational History  . Not on file  Social Needs  . Financial resource strain: Not hard at all  . Food insecurity    Worry: Never true    Inability: Never true  . Transportation needs    Medical: No    Non-medical: No  Tobacco Use  . Smoking status: Never Smoker  . Smokeless tobacco: Never Used  Substance and Sexual Activity  . Alcohol use: No  . Drug use: Never  . Sexual  activity: Not Currently  Lifestyle  . Physical activity    Days per week: 0 days    Minutes per session: Not on file  . Stress: Not at all  Relationships  . Social Herbalist on phone: Not on file    Gets together: Not on file    Attends religious service: Not on file    Active member of club or organization: Not on file    Attends meetings of clubs or organizations: Not on file    Relationship status: Married  Other Topics Concern  . Not on file  Social History Narrative   Lives in [place] with wife, 44years. Children - son 81 and daughter 72.   Work - [employer]   Diet - regular   Exercise - limited, walks occas    Outpatient Encounter Medications as of 07/23/2019  Medication Sig  . amLODipine (NORVASC) 10 MG tablet TAKE 1 TABLET EVERY DAY  .  Bayer Microlet Lancets lancets USE  TWICE DAILY ICD 10 E11.9  . blood glucose meter kit and supplies KIT Dispense based on patient and insurance preference. Check once daily fasting, Dx Code E11.9  . Blood Pressure Monitoring (ADULT BLOOD PRESSURE CUFF LG) KIT Use daily to check blood pressure  . carvedilol (COREG) 12.5 MG tablet Take 1.5 tablets (18.75 mg total) by mouth 2 (two) times daily with a meal.  . cephALEXin (KEFLEX) 500 MG capsule Take 500 mg by mouth 2 (two) times daily.  . empagliflozin (JARDIANCE) 10 MG TABS tablet Take 10 mg by mouth daily.  Marland Kitchen GARLIC OIL PO Take 1 capsule by mouth 4 (four) times daily.  Marland Kitchen glucose blood (BAYER CONTOUR NEXT TEST) test strip USE  STRIP TO CHECK GLUCOSE TWICE DAILY E11.9  . hydrocortisone 2.5 % cream Apply 1 application topically 2 (two) times daily.  Jerrye Beavers Polysacch (ALCORTIN A) 1-2-1 % GEL Apply 1 application topically daily.  Marland Kitchen ketoconazole (NIZORAL) 2 % cream Apply 1 application topically 2 (two) times daily.  Marland Kitchen losartan (COZAAR) 100 MG tablet Take 1 tablet (100 mg total) by mouth daily.  . simvastatin (ZOCOR) 40 MG tablet TAKE 1/2 TABLET EVERY EVENING   No facility-administered encounter medications on file as of 07/23/2019.     Activities of Daily Living In your present state of health, do you have any difficulty performing the following activities: 07/23/2019  Hearing? Y  Comment Hearing aids  Vision? N  Difficulty concentrating or making decisions? N  Walking or climbing stairs? N  Dressing or bathing? N  Doing errands, shopping? N  Preparing Food and eating ? N  Using the Toilet? N  In the past six months, have you accidently leaked urine? N  Do you have problems with loss of bowel control? N  Managing your Medications? N  Managing your Finances? N  Housekeeping or managing your Housekeeping? N  Some recent data might be hidden    Patient Care Team: [name], MD as PCP - General (Family Medicine)  Corey Skains, MD as Referring Physician (Internal Medicine) Manya Silvas, MD (Gastroenterology) Oneta Rack, MD (Dermatology)   Assessment:   This is a routine wellness examination for Jesus Peterson.  I connected with patient 07/23/19 at  9:30 AM EDT by an audio enabled telemedicine application and verified that I am speaking with the correct person using two identifiers. Patient stated full name and DOB. Patient gave permission to continue with virtual visit. Patient's location was at home and Nurse's  location was at [facility] office.   Health Maintenance Due: A1C- 11/19/18 (7.5) Update all pending maintenance due as appropriate.   See completed HM at the end of note.   Eye: Visual acuity not assessed. Virtual visit. Wears corrective lenses. Followed by their ophthalmologist every 12 months.  Retinopathy- none reported  Dental: Visits every 6 months.    Hearing: Demonstrates normal hearing during visit. Hearing aids- yes  Safety:  Patient feels safe at home- yes Patient does have smoke detectors at home- yes Patient does wear sunscreen or protective clothing when in direct sunlight - yes Patient does wear seat belt when in a moving vehicle - yes Patient drives- yes Adequate lighting in walkways free from debris- yes Grab bars and handrails used as appropriate- yes Ambulates with no assistive device Cell phone on person when ambulating outside of the home- yes  Social: Alcohol intake - no    Smoking history- never   Smokers in home? none Illicit drug use? none  Depression: PHQ 2 &9 complete. See screening below. Denies irritability, anhedonia, sadness/tearfullness.    Falls: See screening below.    Medication: Taking as directed and without issues. Monthly pill pill box organizer in use.   Covid-19: Precautions and sickness symptoms discussed. Wears mask, social distancing, hand hygiene as appropriate.   Activities of Daily Living Patient denies needing  assistance with: household chores, feeding themselves, getting from bed to chair, getting to the toilet, bathing/showering, dressing, managing money, or preparing meals.   Memory: Patient is alert. Patient denies difficulty focusing or concentrating. Correctly identified the president of the [place], season and recall. Patient likes to read a little for brain stimulation.  BMI- discussed the importance of a healthy diet, water intake and the benefits of aerobic exercise.  Educational material provided.  Physical activity- stationary bike, yard work  Diet: diabetic Water: good intake Caffeine: 1 cup coffee  Other Providers Patient Care Team: [name], MD as PCP - General (Family Medicine) Corey Skains, MD as Referring Physician (Internal Medicine) Manya Silvas, MD (Gastroenterology) Oneta Rack, MD (Dermatology) Exercise Activities and Dietary recommendations Current Exercise Habits: Home exercise routine, Type of exercise: walking;calisthenics, Intensity: Mild  Goals    . Increase physical activity     Ride bike for exercise Strength training exercises       Fall Risk Fall Risk  07/23/2019 06/27/2018 03/29/2018 06/26/2017 02/05/2017  Falls in the past year? 0 No No No No   Timed Get Up and Go Performed: no, virtual visit  Depression Screen PHQ 2/9 Scores 07/23/2019 06/27/2018 03/29/2018 06/26/2017  PHQ - 2 Score 0 0 0 0  PHQ- 9 Score - - - 0    Cognitive Function MMSE - Mini Mental State Exam 02/12/2015  Orientation to time 5  Orientation to Place 5  Registration 3  Attention/ Calculation 5  Recall 3  Language- name 2 objects 2  Language- repeat 1  Language- follow 3 step command 3  Language- read & follow direction 1  Write a sentence 1  Copy design 1  Total score 30     6CIT Screen 07/23/2019 06/27/2018 06/26/2017  What Year? 0 points 0 points 0 points  What month? 0 points 0 points 0 points  What time? 0 points 0 points 0 points  Count back  from 20 0 points 0 points 0 points  Months in reverse 0 points 0 points 0 points  Repeat phrase 0 points 0 points 0 points  Total Score  0 0 0    Immunization History  Administered Date(s) Administered  . Influenza Split 03/05/2011, 08/02/2012, 07/30/2014  . Influenza,inj,Quad PF,6+ Mos 07/20/2017  . Influenza-Unspecified 08/06/2015, 08/16/2016, 07/19/2018  . Pneumococcal Conjugate-13 11/07/2013  . Pneumococcal Polysaccharide-23 03/05/2011   Screening Tests Health Maintenance  Topic Date Due  . TETANUS/TDAP  04/02/1964  . OPHTHALMOLOGY EXAM  06/12/2018  . INFLUENZA VACCINE  05/17/2019  . HEMOGLOBIN A1C  05/20/2019  . FOOT EXAM  07/23/2019  . COLONOSCOPY  09/10/2023  . Hepatitis C Screening  Completed  . PNA vac Low Risk Adult  Completed      Plan:   Keep all routine maintenance appointments.   Follow up 07/23/19 @ 3:30 with your doctor.  I have personally reviewed and noted the following in the patient's chart:   . Medical and social history . Use of alcohol, tobacco or illicit drugs  . Current medications and supplements . Functional ability and status . Nutritional status . Physical activity . Advanced directives . List of other physicians . Hospitalizations, surgeries, and ER visits in previous 12 months . Vitals . Screenings to include cognitive, depression, and falls . Referrals and appointments  In addition, I have reviewed and discussed with patient certain preventive protocols, quality metrics, and best practice recommendations. A written personalized care plan for preventive services as well as general preventive health recommendations were provided to patient.     Varney Biles, LPN  18/02/5014

## 2019-07-23 NOTE — Assessment & Plan Note (Signed)
I advised the patient to contact his dermatologist just to confirm that they wanted him on 30 days of antibiotics.  Discussed that with prolonged durations of antibiotics there are risks of antibiotic resistance and bowel infections.

## 2019-07-23 NOTE — Assessment & Plan Note (Signed)
Check A1c. 

## 2019-07-24 NOTE — Progress Notes (Signed)
I have reviewed the above note and agree.  Marchel Foote, M.D.  

## 2019-07-25 ENCOUNTER — Other Ambulatory Visit: Payer: Self-pay

## 2019-07-25 ENCOUNTER — Ambulatory Visit
Admission: RE | Admit: 2019-07-25 | Discharge: 2019-07-25 | Disposition: A | Discharge status: Home / Self Care | Payer: Medicare Other | Source: Ambulatory Visit | Attending: Family Medicine | Admitting: Family Medicine

## 2019-07-25 ENCOUNTER — Ambulatory Visit
Admission: RE | Admit: 2019-07-25 | Discharge: 2019-07-25 | Disposition: A | Discharge status: Home / Self Care | Payer: Medicare Other | Attending: Family Medicine | Admitting: Family Medicine

## 2019-07-25 ENCOUNTER — Other Ambulatory Visit
Admission: RE | Admit: 2019-07-25 | Discharge: 2019-07-25 | Disposition: A | Discharge status: Home / Self Care | Payer: Medicare Other | Source: Home / Self Care | Attending: Family Medicine | Admitting: Family Medicine

## 2019-07-25 DIAGNOSIS — R05 Cough: Secondary | ICD-10-CM | POA: Insufficient documentation

## 2019-07-25 DIAGNOSIS — E119 Type 2 diabetes mellitus without complications: Secondary | ICD-10-CM

## 2019-07-25 DIAGNOSIS — I10 Essential (primary) hypertension: Secondary | ICD-10-CM | POA: Insufficient documentation

## 2019-07-25 DIAGNOSIS — R053 Chronic cough: Secondary | ICD-10-CM

## 2019-07-25 LAB — COMPREHENSIVE METABOLIC PANEL
ALT: 25 U/L (ref 0–44)
AST: 22 U/L (ref 15–41)
Albumin: 4.2 g/dL (ref 3.5–5.0)
Alkaline Phosphatase: 59 U/L (ref 38–126)
Anion gap: 9 (ref 5–15)
BUN: 26 mg/dL — ABNORMAL HIGH (ref 8–23)
CO2: 23 mmol/L (ref 22–32)
Calcium: 8.9 mg/dL (ref 8.9–10.3)
Chloride: 106 mmol/L (ref 98–111)
Creatinine, Ser: 1.05 mg/dL (ref 0.61–1.24)
GFR calc Af Amer: >60 mL/min (ref >60)
GFR calc non Af Amer: >60 mL/min (ref >60)
Glucose, Bld: 175 mg/dL — ABNORMAL HIGH (ref 70–99)
Potassium: 3.9 mmol/L (ref 3.5–5.1)
Sodium: 138 mmol/L (ref 135–145)
Total Bilirubin: 1.1 mg/dL (ref 0.3–1.2)
Total Protein: 7.2 g/dL (ref 6.5–8.1)

## 2019-07-25 LAB — HEMOGLOBIN A1C
Hgb A1c MFr Bld: 6.7 % — ABNORMAL HIGH (ref 4.8–5.6)
Mean Plasma Glucose: 145.59 mg/dL

## 2019-07-25 LAB — LIPID PANEL
Cholesterol: 127 mg/dL (ref 0–200)
HDL: 40 mg/dL — ABNORMAL LOW (ref >40)
LDL Cholesterol: 39 mg/dL (ref 0–99)
Total CHOL/HDL Ratio: 3.2 RATIO
Triglycerides: 240 mg/dL — ABNORMAL HIGH (ref <150)
VLDL: 48 mg/dL — ABNORMAL HIGH (ref 0–40)

## 2019-07-26 ENCOUNTER — Encounter: Payer: Self-pay | Admitting: Family Medicine

## 2019-08-01 ENCOUNTER — Other Ambulatory Visit: Payer: Self-pay | Admitting: Family Medicine

## 2019-08-01 DIAGNOSIS — R053 Chronic cough: Secondary | ICD-10-CM

## 2019-08-01 DIAGNOSIS — R05 Cough: Secondary | ICD-10-CM

## 2019-08-05 NOTE — Telephone Encounter (Signed)
error 

## 2019-08-08 ENCOUNTER — Encounter: Payer: Self-pay | Admitting: Family Medicine

## 2019-08-18 DIAGNOSIS — L2089 Other atopic dermatitis: Secondary | ICD-10-CM | POA: Diagnosis not present

## 2019-08-19 ENCOUNTER — Encounter: Payer: Self-pay | Admitting: Family Medicine

## 2019-08-21 LAB — HEPATIC FUNCTION PANEL
ALT: 31 (ref 10–40)
AST: 14 (ref 14–40)
Bilirubin, Direct: 0.2
Bilirubin, Total: 0.7

## 2019-08-21 LAB — COMPREHENSIVE METABOLIC PANEL
Albumin: 4 (ref 3.5–5.0)
Calcium: 9.3 (ref 8.7–10.7)

## 2019-08-21 LAB — BASIC METABOLIC PANEL
CO2: 26 — AB (ref 13–22)
Chloride: 107 (ref 99–108)
Creatinine: 1 (ref <=1.3)
Glucose: 161
Potassium: 4.7 (ref 3.4–5.3)
Sodium: 138 (ref 137–147)

## 2019-08-21 LAB — CBC: RBC: 5.88 — AB (ref 3.87–5.11)

## 2019-08-21 LAB — CBC AND DIFFERENTIAL
HCT: 51 (ref 41–53)
Hemoglobin: 17.1 (ref 13.5–17.5)
Neutrophils Absolute: 67
Platelets: 150 (ref 150–399)
WBC: 7.9

## 2019-08-21 LAB — HEMOGLOBIN A1C: Hemoglobin A1C: 6.8

## 2019-08-21 LAB — LIPID PANEL
Cholesterol: 151 (ref 0–200)
HDL: 47 (ref 35–70)
LDL Cholesterol: 46
Triglycerides: 290 — AB (ref 40–160)

## 2019-08-21 LAB — VITAMIN D 25 HYDROXY (VIT D DEFICIENCY, FRACTURES): Vit D, 25-Hydroxy: 29.04

## 2019-08-21 LAB — PSA: PSA: 0.53

## 2019-09-01 ENCOUNTER — Telehealth: Payer: Self-pay | Admitting: *Deleted

## 2019-09-01 NOTE — Telephone Encounter (Signed)
Copied from [place] [phone]. Topic: General - Other >> Sep 01, 2019  2:53 PM Keene Breath wrote: Reason for CRM: Patient called to speak with the nurse or doctor about switching from the [place].  Patient was not exactly sure of what he should do.  Please advise and call patient back at [phone] or [phone]

## 2019-09-01 NOTE — Telephone Encounter (Signed)
I called and scheduled the patient to see the provider.  He asked me about the program of [facility] community care and I gave him the name of [facility] and he will call and see about any co pays and billing info.  Nina,cma

## 2019-09-10 ENCOUNTER — Other Ambulatory Visit: Payer: Self-pay

## 2019-09-18 DIAGNOSIS — Z85820 Personal history of malignant melanoma of skin: Secondary | ICD-10-CM | POA: Diagnosis not present

## 2019-09-18 DIAGNOSIS — L570 Actinic keratosis: Secondary | ICD-10-CM | POA: Diagnosis not present

## 2019-09-18 DIAGNOSIS — L720 Epidermal cyst: Secondary | ICD-10-CM | POA: Diagnosis not present

## 2019-09-18 DIAGNOSIS — Z859 Personal history of malignant neoplasm, unspecified: Secondary | ICD-10-CM | POA: Diagnosis not present

## 2019-09-18 DIAGNOSIS — L578 Other skin changes due to chronic exposure to nonionizing radiation: Secondary | ICD-10-CM | POA: Diagnosis not present

## 2019-09-18 DIAGNOSIS — Z85828 Personal history of other malignant neoplasm of skin: Secondary | ICD-10-CM | POA: Diagnosis not present

## 2019-09-18 DIAGNOSIS — L821 Other seborrheic keratosis: Secondary | ICD-10-CM | POA: Diagnosis not present

## 2019-09-18 DIAGNOSIS — Z872 Personal history of diseases of the skin and subcutaneous tissue: Secondary | ICD-10-CM | POA: Diagnosis not present

## 2019-09-19 ENCOUNTER — Telehealth: Payer: Self-pay | Admitting: *Deleted

## 2019-09-19 NOTE — Telephone Encounter (Signed)
Copied from [place] [phone]. Topic: General - Inquiry >> Sep 19, 2019  1:41 PM Alease Frame wrote: Reason for CRM: patient would like a call from [place] . Please advise

## 2019-09-19 NOTE — Telephone Encounter (Signed)
I do not recall calling the patient but I did call today and left a message to call back.  Nina,cma

## 2019-09-22 NOTE — Telephone Encounter (Signed)
Noted. I can discuss with him tomorrow.

## 2019-09-22 NOTE — Telephone Encounter (Signed)
Patient would like a call from [place] when she is available. Please advise

## 2019-09-23 ENCOUNTER — Other Ambulatory Visit: Payer: Self-pay

## 2019-09-23 ENCOUNTER — Ambulatory Visit (INDEPENDENT_AMBULATORY_CARE_PROVIDER_SITE_OTHER): Payer: Medicare Other | Admitting: Family Medicine

## 2019-09-23 ENCOUNTER — Encounter: Payer: Self-pay | Admitting: Family Medicine

## 2019-09-23 DIAGNOSIS — E785 Hyperlipidemia, unspecified: Secondary | ICD-10-CM

## 2019-09-23 DIAGNOSIS — D1802 Hemangioma of intracranial structures: Secondary | ICD-10-CM

## 2019-09-23 DIAGNOSIS — I10 Essential (primary) hypertension: Secondary | ICD-10-CM

## 2019-09-23 DIAGNOSIS — L309 Dermatitis, unspecified: Secondary | ICD-10-CM

## 2019-09-23 NOTE — Assessment & Plan Note (Signed)
BP has been above goal with numbers consistently in the 0000000 systolically.  He does not know his pulse rate.  He will check his BP and his pulse over the next 3 to 4 days and send [place] the numbers through my chart.  We can then determine if we can go up on his carvedilol or if we need to start an additional BP medication.

## 2019-09-23 NOTE — Assessment & Plan Note (Signed)
Continue simvastatin. 

## 2019-09-23 NOTE — Assessment & Plan Note (Signed)
Patient has never had any symptoms related to this.  He reports he was released by neurology previously.  I discussed that the risk of this would be if it bled and there would likely be nothing that they could do for it.  He will monitor.

## 2019-09-23 NOTE — Progress Notes (Signed)
Virtual Visit via telephone Note  This visit type was conducted due to national recommendations for restrictions regarding the COVID-19 pandemic (e.g. social distancing).  This format is felt to be most appropriate for this patient at this time.  All issues noted in this document were discussed and addressed.  No physical exam was performed (except for noted visual exam findings with Video Visits).   I connected with Jesus Peterson today at 12:00 PM EST by telephone and verified that I am speaking with the correct person using two identifiers. Location patient: home Location provider: home office Persons participating in the virtual visit: patient, provider  I discussed the limitations, risks, security and privacy concerns of performing an evaluation and management service by telephone and the availability of in person appointments. I also discussed with the patient that there may be a patient responsible charge related to this service. The patient expressed understanding and agreed to proceed.  Interactive audio and video telecommunications were attempted between this provider and patient, however failed, due to patient having technical difficulties OR patient did not have access to video capability.  We continued and completed visit with audio only.   Reason for visit: follow-up  HPI: HYPERTENSION  Disease Monitoring  Home BP Monitoring 143/79 Chest pain- no    Dyspnea- no Medications  Compliance-  Amlodipine, coreg, losartan.   Edema- no  HLD: taking simvastatin. No RUQ pain or myalgias.  Angioma: History of this identified incidentally on MRI.  He follows with neurology for this for quite some time though notes they released him due to stability of the lesion.  He has never had any symptoms from it.  Eczema: He notes this has improved quite a bit.  He tried a topical treatment through the [place].  He did not take the oral prednisone as prescribed by his dermatologist.   ROS: See  pertinent positives and negatives per HPI.  Past Medical History:  Diagnosis Date   Cancer ([place])    MELANOMA OF SKIN   Chronic kidney disease    Erectile dysfunction    History of kidney stones    Hyperlipidemia    Hypertension    Seborrheic dermatitis    Treadmill stress test negative for angina pectoris 2013   Dr. Nehemiah Massed, normal per pt   Valvular heart disease    Venous angioma of brain ([place])    Pons, found 2/2 tinnitus, followed on MRI    Past Surgical History:  Procedure Laterality Date   COLONOSCOPY  08/24/2006,08/23/2012   COLONOSCOPY WITH PROPOFOL N/A 09/09/2018   Procedure: COLONOSCOPY WITH PROPOFOL;  Surgeon: Lollie Sails, MD;  Location: [facility] ENDOSCOPY;  Service: Endoscopy;  Laterality: N/A;    Family History  Problem Relation Age of Onset   Stroke Mother    Heart disease Mother        s/p CABG   Heart disease Father    Heart disease Brother        s/p CABG   Heart disease Brother        s/p angioplasty   Arthritis Brother     SOCIAL HX: Non-smoker   Current Outpatient Medications:    amLODipine (NORVASC) 10 MG tablet, TAKE 1 TABLET EVERY DAY, Disp: 90 tablet, Rfl: 2   Bayer Microlet Lancets lancets, USE  TWICE DAILY ICD 10 E11.9, Disp: 100 each, Rfl: 11   blood glucose meter kit and supplies KIT, Dispense based on patient and insurance preference. Check once daily fasting, Dx Code E11.9, Disp: 1  each, Rfl: 0   Blood Pressure Monitoring (ADULT BLOOD PRESSURE CUFF LG) KIT, Use daily to check blood pressure, Disp: 1 each, Rfl: 0   carvedilol (COREG) 12.5 MG tablet, Take 1.5 tablets (18.75 mg total) by mouth 2 (two) times daily with a meal., Disp: 270 tablet, Rfl: 2   cephALEXin (KEFLEX) 500 MG capsule, Take 500 mg by mouth 2 (two) times daily., Disp: , Rfl:    GARLIC OIL PO, Take 1 capsule by mouth 4 (four) times daily., Disp: , Rfl:    glucose blood (BAYER CONTOUR NEXT TEST) test strip, USE  STRIP TO CHECK GLUCOSE TWICE DAILY  E11.9, Disp: 100 each, Rfl: 11   hydrocortisone 2.5 % cream, Apply 1 application topically 2 (two) times daily., Disp: , Rfl:    Iodoquinol-HC-Aloe Polysacch (ALCORTIN A) 1-2-1 % GEL, Apply 1 application topically daily., Disp: , Rfl:    ketoconazole (NIZORAL) 2 % cream, Apply 1 application topically 2 (two) times daily., Disp: , Rfl:    losartan (COZAAR) 100 MG tablet, Take 1 tablet (100 mg total) by mouth daily., Disp: 90 tablet, Rfl: 0   simvastatin (ZOCOR) 40 MG tablet, TAKE 1/2 TABLET EVERY EVENING, Disp: 45 tablet, Rfl: 1  EXAM: This was a telehealth telephone visit and thus no physical exam was completed.   ASSESSMENT AND PLAN:  Discussed the following assessment and plan:  Hypertension BP has been above goal with numbers consistently in the 951O systolically.  He does not know his pulse rate.  He will check his BP and his pulse over the next 3 to 4 days and send [place] the numbers through my chart.  We can then determine if we can go up on his carvedilol or if we need to start an additional BP medication.  Angioma Patient has never had any symptoms related to this.  He reports he was released by neurology previously.  I discussed that the risk of this would be if it bled and there would likely be nothing that they could do for it.  He will monitor.  Hyperlipidemia Continue simvastatin.  Eczema We will continue to monitor with topical treatment.    I discussed the assessment and treatment plan with the patient. The patient was provided an opportunity to ask questions and all were answered. The patient agreed with the plan and demonstrated an understanding of the instructions.   The patient was advised to call back or seek an in-person evaluation if the symptoms worsen or if the condition fails to improve as anticipated.  I provided 12 minutes of non-face-to-face time during this encounter.   [name], MD

## 2019-09-23 NOTE — Assessment & Plan Note (Signed)
We will continue to monitor with topical treatment.

## 2019-09-26 ENCOUNTER — Other Ambulatory Visit: Payer: Self-pay | Admitting: Family Medicine

## 2019-10-06 ENCOUNTER — Encounter: Payer: Self-pay | Admitting: Family Medicine

## 2019-10-06 DIAGNOSIS — I10 Essential (primary) hypertension: Secondary | ICD-10-CM

## 2019-10-07 MED ORDER — HYDROCHLOROTHIAZIDE 12.5 MG PO TABS: 12.5000 mg | ORAL_TABLET | Freq: Every day | ORAL | 1 refills | Status: DC

## 2019-10-08 ENCOUNTER — Telehealth: Payer: Self-pay

## 2019-10-08 DIAGNOSIS — N189 Chronic kidney disease, unspecified: Secondary | ICD-10-CM | POA: Insufficient documentation

## 2019-10-08 NOTE — Telephone Encounter (Signed)
I called and scheduled a nurse visit and a lab appt with the patint in one month.  Chelci Wintermute,cma

## 2019-10-14 ENCOUNTER — Telehealth: Payer: Self-pay | Admitting: Family Medicine

## 2019-10-14 NOTE — Telephone Encounter (Signed)
Pt needs a refill on glucose blood (BAYER CONTOUR NEXT TEST) test strip sent to [facility]

## 2019-10-15 ENCOUNTER — Telehealth: Payer: Self-pay

## 2019-10-15 MED ORDER — GLUCOSE BLOOD VI STRP: ORAL_STRIP | 11 refills | Status: DC

## 2019-10-15 NOTE — Telephone Encounter (Signed)
Patient called and needed strips, Strips sent to pharmacy per patient request.  Jarell Mcewen,cma

## 2019-10-15 NOTE — Telephone Encounter (Signed)
Done. Jalan Fariss,cma  

## 2019-10-28 ENCOUNTER — Encounter: Payer: Self-pay | Admitting: Family Medicine

## 2019-10-30 MED ORDER — GLUCOSE BLOOD VI STRP: ORAL_STRIP | 11 refills | Status: DC

## 2019-11-04 ENCOUNTER — Other Ambulatory Visit: Payer: Self-pay

## 2019-11-06 ENCOUNTER — Other Ambulatory Visit: Payer: Self-pay

## 2019-11-06 ENCOUNTER — Other Ambulatory Visit (INDEPENDENT_AMBULATORY_CARE_PROVIDER_SITE_OTHER): Payer: Medicare Other

## 2019-11-06 ENCOUNTER — Ambulatory Visit (INDEPENDENT_AMBULATORY_CARE_PROVIDER_SITE_OTHER): Payer: Medicare Other

## 2019-11-06 VITALS — BP 130/80 | HR 64

## 2019-11-06 DIAGNOSIS — I10 Essential (primary) hypertension: Secondary | ICD-10-CM

## 2019-11-06 DIAGNOSIS — E119 Type 2 diabetes mellitus without complications: Secondary | ICD-10-CM | POA: Diagnosis not present

## 2019-11-06 DIAGNOSIS — E785 Hyperlipidemia, unspecified: Secondary | ICD-10-CM | POA: Diagnosis not present

## 2019-11-06 LAB — LIPID PANEL
Cholesterol: 135 mg/dL (ref 0–200)
HDL: 38.1 mg/dL — ABNORMAL LOW (ref >39.00)
NonHDL: 97.23
Total CHOL/HDL Ratio: 4
Triglycerides: 222 mg/dL — ABNORMAL HIGH (ref 0.0–149.0)
VLDL: 44.4 mg/dL — ABNORMAL HIGH (ref 0.0–40.0)

## 2019-11-06 LAB — BASIC METABOLIC PANEL
BUN: 27 mg/dL — ABNORMAL HIGH (ref 6–23)
CO2: 26 mEq/L (ref 19–32)
Calcium: 9.3 mg/dL (ref 8.4–10.5)
Chloride: 103 mEq/L (ref 96–112)
Creatinine, Ser: 1.16 mg/dL (ref 0.40–1.50)
GFR: 61.45 mL/min (ref >60.00)
Glucose, Bld: 178 mg/dL — ABNORMAL HIGH (ref 70–99)
Potassium: 3.8 mEq/L (ref 3.5–5.1)
Sodium: 138 mEq/L (ref 135–145)

## 2019-11-06 LAB — COMPREHENSIVE METABOLIC PANEL
ALT: 20 U/L (ref 0–53)
AST: 16 U/L (ref 0–37)
Albumin: 4.4 g/dL (ref 3.5–5.2)
Alkaline Phosphatase: 70 U/L (ref 39–117)
BUN: 27 mg/dL — ABNORMAL HIGH (ref 6–23)
CO2: 26 mEq/L (ref 19–32)
Calcium: 9.3 mg/dL (ref 8.4–10.5)
Chloride: 103 mEq/L (ref 96–112)
Creatinine, Ser: 1.16 mg/dL (ref 0.40–1.50)
GFR: 61.45 mL/min (ref >60.00)
Glucose, Bld: 178 mg/dL — ABNORMAL HIGH (ref 70–99)
Potassium: 3.8 mEq/L (ref 3.5–5.1)
Sodium: 138 mEq/L (ref 135–145)
Total Bilirubin: 0.9 mg/dL (ref 0.2–1.2)
Total Protein: 7.2 g/dL (ref 6.0–8.3)

## 2019-11-06 LAB — LDL CHOLESTEROL, DIRECT: Direct LDL: 75 mg/dL

## 2019-11-06 LAB — HEMOGLOBIN A1C: Hgb A1c MFr Bld: 7.5 % — ABNORMAL HIGH (ref 4.6–6.5)

## 2019-11-06 LAB — MICROALBUMIN / CREATININE URINE RATIO
Creatinine,U: 199.7 mg/dL
Microalb Creat Ratio: 1.1 mg/g (ref 0.0–30.0)
Microalb, Ur: 2.2 mg/dL — ABNORMAL HIGH (ref 0.0–1.9)

## 2019-11-06 NOTE — Addendum Note (Signed)
Addended by: Leeanne Rio on: 11/06/2019 01:52 PM   Modules accepted: Orders

## 2019-11-06 NOTE — Progress Notes (Signed)
Patient here for nurse visit BP check per order from Dr. [name]   Patient reports compliance with prescribed BP medications: yes  Last dose of BP medication: Last night    BP Readings from Last 3 Encounters:  11/06/19 130/80  11/19/18 138/90  09/09/18 133/90   Pulse Readings from Last 3 Encounters:  11/06/19 64  11/19/18 66  09/09/18 (!) 58    Per Dr. Mclean-Scocuzza:   Patient was informed to keep his current regimen and continue to monitor BP at home.  Patient verbalized understanding of instructions.   Gordy Councilman, CMA

## 2019-11-06 NOTE — Addendum Note (Signed)
Addended by: Leeanne Rio on: 11/06/2019 09:42 AM   Modules accepted: Orders

## 2019-11-07 ENCOUNTER — Other Ambulatory Visit: Payer: Medicare Other

## 2019-11-11 DIAGNOSIS — Z91041 Radiographic dye allergy status: Secondary | ICD-10-CM | POA: Diagnosis not present

## 2019-11-11 DIAGNOSIS — Z880 Allergy status to penicillin: Secondary | ICD-10-CM | POA: Diagnosis not present

## 2019-11-11 DIAGNOSIS — Z7189 Other specified counseling: Secondary | ICD-10-CM | POA: Diagnosis not present

## 2019-11-21 ENCOUNTER — Other Ambulatory Visit: Payer: Self-pay

## 2019-11-27 ENCOUNTER — Other Ambulatory Visit: Payer: Self-pay | Admitting: Family Medicine

## 2019-11-27 ENCOUNTER — Other Ambulatory Visit: Payer: Self-pay

## 2019-11-27 DIAGNOSIS — E119 Type 2 diabetes mellitus without complications: Secondary | ICD-10-CM

## 2019-11-27 DIAGNOSIS — E785 Hyperlipidemia, unspecified: Secondary | ICD-10-CM

## 2019-11-27 MED ORDER — ROSUVASTATIN CALCIUM 20 MG PO TABS: 20.0000 mg | ORAL_TABLET | Freq: Every day | ORAL | 0 refills | Status: DC

## 2019-11-27 NOTE — Progress Notes (Signed)
Discontinued simvastatin and started patient on new medication Crestor and sent to pharmacy, also ordered liver panel in 6 weeks per provider.  Timiko Offutt,cma

## 2019-12-01 ENCOUNTER — Telehealth: Payer: Self-pay | Admitting: Family Medicine

## 2019-12-01 NOTE — Chronic Care Management (AMB) (Signed)
  Chronic Care Management   Note  12/01/2019 Name: Jesus Peterson MRN: [id number] DOB: 1946-07-31  Jesus Peterson is a 74 y.o. year old male who is a primary care patient of [name], [name], MD. I reached out to [place] Main by phone today in response to a referral sent by [name]'s PCP, Dr. [name]     [name] was given information about Chronic Care Management services today including:  1. CCM service includes personalized support from designated clinical staff supervised by his physician, including individualized plan of care and coordination with other care providers 2. 24/7 contact phone numbers for assistance for urgent and routine care needs. 3. Service will only be billed when office clinical staff spend 20 minutes or more in a month to coordinate care. 4. Only one practitioner may furnish and bill the service in a calendar month. 5. The patient may stop CCM services at any time (effective at the end of the month) by phone call to the office staff. 6. The patient will be responsible for cost sharing (co-pay) of up to 20% of the service fee (after annual deductible is met).  Patient agreed to services and verbal consent obtained.   Follow up plan: Telephone appointment with care management team member scheduled for:01/01/2020  Glenna Durand, LPN Health Advisor, [facility] ??[email or web address] ??[phone]

## 2019-12-23 ENCOUNTER — Other Ambulatory Visit: Payer: Self-pay

## 2019-12-23 ENCOUNTER — Ambulatory Visit (INDEPENDENT_AMBULATORY_CARE_PROVIDER_SITE_OTHER): Payer: Medicare Other | Admitting: Family Medicine

## 2019-12-23 ENCOUNTER — Telehealth: Payer: Self-pay | Admitting: Family Medicine

## 2019-12-23 ENCOUNTER — Encounter: Payer: Self-pay | Admitting: Family Medicine

## 2019-12-23 DIAGNOSIS — I10 Essential (primary) hypertension: Secondary | ICD-10-CM

## 2019-12-23 DIAGNOSIS — E785 Hyperlipidemia, unspecified: Secondary | ICD-10-CM

## 2019-12-23 DIAGNOSIS — E119 Type 2 diabetes mellitus without complications: Secondary | ICD-10-CM | POA: Diagnosis not present

## 2019-12-23 NOTE — Telephone Encounter (Signed)
-----   Message from De Hollingshead, [facility] sent at 12/23/2019  2:22 PM EST ----- Hey   If he doesn't have a Part D plan, then no, the drug won't be covered. I don't see a Part D plan on file, but can definitely talk to him about all that next week. Does Juliann Pulse have any Jardiance samples? Pending income, should be able to get him the medication easily from the drug company through patient assistance.   And agreed, only true contraindication is a reaction to a vaccine ingredient. Most contrast dyes do not include polyethylene glycol, some do. I assume his colonoscopy prep in 2019 was a polyethylene glycol containing prep, but he can check with the GI office or the pharmacy. They tend to give paper scripts for those, so I don't see it documented in Epic. If we want to be extra safe, could have him make sure that was a PEG medication and that he was fine prior to getting COVID vaccine. Advise that he should wait 30 minutes after vaccination for observation, not the general 15.   Catie  ----- Message ----- From: [name], MD Sent: 12/23/2019   2:12 PM EST To: De Hollingshead, [facility]  Hey Catie,   I have a couple of questions. This patient has a visit with you in a couple of weeks for his diabetes. I would like to start him on jardiance as he has been intolerant of metformin previously. Does medicare generally cover Jardiance?  He also wondered about getting the COVID-19 vaccine.  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  I wanted to confirm that with you as well.  Thanks.Randall Hiss

## 2019-12-23 NOTE — Assessment & Plan Note (Signed)
Adequate control.  Continue current medication. 

## 2019-12-23 NOTE — Progress Notes (Signed)
  Jesus Rumps, MD Phone: [phone]  Jesus Peterson is a 74 y.o. male who presents today for follow-up.  Hypertension: Notes his blood pressure is typically similar to today.  Taking losartan, HCTZ, carvedilol, and amlodipine.  No chest pain, shortness of breath, or edema.  Hyperlipidemia: Taking Crestor.  No right upper quadrant pain or myalgias.  Diabetes: Patient is due to meet with our clinical pharmacist in the next several weeks.  He has been intolerant of Metformin.  Notes his glucose has gotten up into the 180s.  Covid 19 vaccine questions: Patient was previously advised not to get the COVID-19 vaccine given his history of anaphylaxis.  At the time of this advice that was the recommendation of the CDC though those recommendations have currently changed to preclude patients who have had anaphylactic reactions to ingredients in the vaccine.  He wonders if he should get the vaccine.  Social History   Tobacco Use  Smoking Status Never Smoker  Smokeless Tobacco Never Used     ROS see history of present illness  Objective  Physical Exam Vitals:   12/23/19 1220  BP: 130/70  Pulse: 68  Temp: (!) 95.9 F (35.5 C)  SpO2: 97%    BP Readings from Last 3 Encounters:  12/23/19 130/70  11/06/19 130/80  11/19/18 138/90   Wt Readings from Last 3 Encounters:  12/23/19 233 lb 6.4 oz (105.9 kg)  09/23/19 240 lb (108.9 kg)  07/23/19 234 lb (106.1 kg)    Physical Exam Constitutional:      General: He is not in acute distress.    Appearance: He is not diaphoretic.  Cardiovascular:     Rate and Rhythm: Normal rate and regular rhythm.     Heart sounds: Normal heart sounds.  Pulmonary:     Effort: Pulmonary effort is normal.     Breath sounds: Normal breath sounds.  Musculoskeletal:     Right lower leg: No edema.     Left lower leg: No edema.  Skin:    General: Skin is warm and dry.  Neurological:     Mental Status: He is alert.      Assessment/Plan: Please see  individual problem list.  Hypertension Adequate control.  Continue current medication.  Diabetes type 2, controlled Consider starting on Jardiance.  He will follow up with our clinical pharmacist as planned.  Hyperlipidemia Continue Crestor.   Health Maintenance: Discussed that I would check with our clinical pharmacist to confirm that he should be able to get the COVID-19 vaccine though I did advise that he likely would be able to get it given the current recommendations.  No orders of the defined types were placed in this encounter.   No orders of the defined types were placed in this encounter.   This visit occurred during the SARS-CoV-2 public health emergency.  Safety protocols were in place, including screening questions prior to the visit, additional usage of staff PPE, and extensive cleaning of exam room while observing appropriate contact time as indicated for disinfecting solutions.    Jesus Rumps, MD [place]

## 2019-12-23 NOTE — Assessment & Plan Note (Signed)
Continue Crestor 

## 2019-12-23 NOTE — Patient Instructions (Signed)
Nice to see you. We will get in touch with our pharmacist and let you know what she says.

## 2019-12-23 NOTE — Assessment & Plan Note (Signed)
Consider starting on Jardiance.  He will follow up with our clinical pharmacist as planned.

## 2019-12-23 NOTE — Telephone Encounter (Signed)
Please call the patient and let him know that I heard back from our pharmacist. She advised that he check with his GI provider to see if his most recent colonoscopy prep contained polyethylene glycol and to make sure that he did not have a reaction to the prep if it was polyethylene glycol. If it was polyethylene glycol and he did not have a reaction then he could proceed with the COVID19 vaccine. He would need to be observed for 30 minutes after the vaccine. She will also discuss potential diabetes medications with him during their upcoming phone call.

## 2019-12-23 NOTE — Addendum Note (Signed)
Addended by: Fulton Mole D on: 12/23/2019 05:00 PM   Modules accepted: Orders

## 2019-12-23 NOTE — Telephone Encounter (Signed)
I called and informed the patient of the message from the provider about the covid vaccine and he understood, he asked me to send the message to mychart and I did.  Latika Kronick,cma

## 2019-12-24 DIAGNOSIS — H524 Presbyopia: Secondary | ICD-10-CM | POA: Diagnosis not present

## 2019-12-24 DIAGNOSIS — H5203 Hypermetropia, bilateral: Secondary | ICD-10-CM | POA: Diagnosis not present

## 2019-12-24 DIAGNOSIS — H52222 Regular astigmatism, left eye: Secondary | ICD-10-CM | POA: Diagnosis not present

## 2019-12-24 DIAGNOSIS — H43393 Other vitreous opacities, bilateral: Secondary | ICD-10-CM | POA: Diagnosis not present

## 2019-12-24 DIAGNOSIS — H25813 Combined forms of age-related cataract, bilateral: Secondary | ICD-10-CM | POA: Diagnosis not present

## 2019-12-24 DIAGNOSIS — H43813 Vitreous degeneration, bilateral: Secondary | ICD-10-CM | POA: Diagnosis not present

## 2020-01-01 ENCOUNTER — Ambulatory Visit (INDEPENDENT_AMBULATORY_CARE_PROVIDER_SITE_OTHER): Payer: Medicare Other | Admitting: Pharmacist

## 2020-01-01 DIAGNOSIS — I10 Essential (primary) hypertension: Secondary | ICD-10-CM

## 2020-01-01 DIAGNOSIS — E119 Type 2 diabetes mellitus without complications: Secondary | ICD-10-CM

## 2020-01-01 MED ORDER — CARVEDILOL 12.5 MG PO TABS: 12.5000 mg | ORAL_TABLET | Freq: Two times a day (BID) | ORAL | 3 refills | Status: DC

## 2020-01-01 MED ORDER — JARDIANCE 10 MG PO TABS: 10.0000 mg | ORAL_TABLET | Freq: Every day | ORAL | 2 refills | Status: DC

## 2020-01-01 NOTE — Chronic Care Management (AMB) (Signed)
Chronic Care Management   Note  01/01/2020 Name: Jesus Peterson MRN: [id number] DOB: 24-Oct-1945   Subjective:  Jesus Peterson is a 74 y.o. year old male who is a primary care patient of [name], [name], MD. The CCM team was consulted for assistance with chronic disease management and care coordination needs.    Contacted patient for medication management review  Review of patient status, including review of consultants reports, laboratory and other test data, was performed as part of comprehensive evaluation and provision of chronic care management services.   SDOH (Social Determinants of Health) assessments and interventions performed:  yes  Objective:  Lab Results  Component Value Date   CREATININE 1.16 11/06/2019   CREATININE 1.16 11/06/2019   CREATININE 1.0 08/21/2019    Lab Results  Component Value Date   HGBA1C 7.5 (H) 11/06/2019       Component Value Date/Time   CHOL 135 11/06/2019 0932   TRIG 222.0 (H) 11/06/2019 0932   HDL 38.10 (L) 11/06/2019 0932   CHOLHDL 4 11/06/2019 0932   VLDL 44.4 (H) 11/06/2019 0932   LDLCALC 46 08/21/2019 0000   LDLDIRECT 75.0 11/06/2019 0932    Clinical ASCVD: No  The 10-year ASCVD risk score [name], et al., 2013) is: 44.5%   Values used to calculate the score:     Age: 63 years     Sex: Male     Is Non-Hispanic African American: No     Diabetic: Yes     Tobacco smoker: No     Systolic Blood Pressure: 562 mmHg     Is BP treated: Yes     HDL Cholesterol: 38.1 mg/dL     Total Cholesterol: 135 mg/dL    BP Readings from Last 3 Encounters:  12/23/19 130/70  11/06/19 130/80  11/19/18 138/90    Allergies  Allergen Reactions  . Contrast Media [Iodinated Diagnostic Agents]   . Penicillins     Medications Reviewed Today    Reviewed by De Hollingshead, [facility] (Pharmacist) on 01/01/20 at [place] List Status: <None>  Medication Order Taking? Sig Documenting Provider Last Dose Status Informant  amLODipine (NORVASC)  10 MG tablet [id number] Yes TAKE 1 TABLET EVERY DAY [name], MD Taking Active   Bayer Microlet Lancets lancets [id number] Yes USE  TWICE DAILY ICD 10 E11.9 [name], MD Taking Active   blood glucose meter kit and supplies KIT [id number] Yes Dispense based on patient and insurance preference. Check once daily fasting, Dx Code E11.9 [name], MD Taking Active   Blood Pressure Monitoring (ADULT BLOOD PRESSURE CUFF LG) KIT [id number] Yes Use daily to check blood pressure [name], MD Taking Active   carvedilol (COREG) 12.5 MG tablet [id number] Yes Take 1.5 tablets (18.75 mg total) by mouth 2 (two) times daily with a meal. [name], MD Taking Active            Med Note Nat Christen Jan 01, 2020  9:08 AM) Taking 2 tab BID  GARLIC OIL PO [id number] No Take 1 capsule by mouth 4 (four) times daily. [provider] Not Taking Active   glucose blood (BAYER CONTOUR NEXT TEST) test strip [id number] Yes USE  STRIP TO CHECK GLUCOSE TWICE DAILY E11.9 [name], MD Taking Active   hydrochlorothiazide (HYDRODIURIL) 12.5 MG tablet [id number] Yes Take 1 tablet (12.5 mg total) by mouth daily. [name], MD Taking Active  Discontinued 01/01/20 0911 (Completed Course)         Discontinued 01/01/20 0911 (Completed Course)         Discontinued 01/01/20 0911 (Completed Course)   losartan (COZAAR) 100 MG tablet [id number] Yes Take 1 tablet (100 mg total) by mouth daily. [name], MD Taking Active   rosuvastatin (CRESTOR) 20 MG tablet [id number] Yes Take 1 tablet (20 mg total) by mouth daily. [name], MD Taking Active            Assessment:   Goals Addressed            This Visit's Progress     Patient Stated   . "I want to work on my diabetes" (pt-stated)       CARE PLAN ENTRY (see longtitudinal plan of care for additional care plan information)  Current Barriers:  . Diabetes: uncontrolled;  complicated by chronic medical conditions including HTN, CKD, most recent A1c 7.5% . Most recent eGFR: ~61 mL/min . Current antihyperglycemic regimen: none  o Hx metformin XR; significant nausea . Current meal patterns: o Breakfast: pancakes today (but not regularly); scrambled eggs; bacon; sometimes cereal; coffee  o Lunch/supper : meats + vegetables o Drinks: Water; milk, avoids sodas; sugar free lemonade  . Current exercise: rides his bike quite a bit when weather  . Current blood glucose readings:  o Fastings: 160-180s . Cardiovascular risk reduction: o Current hypertensive regimen: amlodipine 10 mg after lunch, carvedilol 12 mg BID (though prescribed 1.5 tabs BID); HCTZ 12.5 mg QAM, losartan 100 mg QAM - Home BP 130s/70s; HR: 58-60s  o Current hyperlipidemia regimen: rosuvastatin 20 mg daily (just changed from simvastatin, denies any muscle side effects); last LDL 75 o Current antiplatelet regimen: none . 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 Wonders if these things preclude him from vaccination  Pharmacist Clinical Goal(s):  Marland Kitchen Over the next 90 days, patient will work with PharmD and primary care provider to address optimized medication management  Interventions: . Comprehensive medication review performed, medication list updated in electronic medical record . Educated on goal A1c, goal fasting, and goal 2 hour post prandial glucose  . Discussed benefits of SGLT2, including glycemic, renal, and cardiovascular. Discussed common side effects, including genitourinary infections. Patient in agreement to start Jardiance 10 mg daily. Counseled to take in the morning and remain well hydrated.  . Discussed COVID vaccine contraindication of hx anaphylaxis to PEG (as some contrast dyes do include PEG). Patient notes he has had ~5 colonoscopies and no issues w/ bowel prep PEG. Discussed  that he should plan to wait 30 minutes for observation after COVID vaccination.  . Discussed hx angioma. Patient expressed concerns about the sx of HA after vaccination, and how that may be related to vasodilation in the brain, and his concerns of this being a problem with his angioma, as he was previously counseled to avoid PDE5i for ED d/t vasodilation. Discussed COVID vaccine side effects as part of the natural immune response, and that there is no data to suggest that COVID vaccination could cause significant brain vasodilation. Also discussed peripheral vasodilation of his antihypertensives and principals of the blood brain barrier. Patient verbalized understanding and appreciation.  . Patient reports he has been taking carvedilol 12.5 mg  BID (1 tablet BID instead of 1.5 tab BID) for months. BP and HR remain well controlled. Updated this prescription to reflect use. Will  continue to monitor BP/HR moving forward and any need to adjust his regimen.   Patient Self Care Activities:  . Patient will check blood glucose QD-BID, document, and provide at future appointments . Patient will take medications as prescribed . Patient will report any questions or concerns to provider   Initial goal documentation        Plan: - Scheduled f/u call 02/02/20  Catie Darnelle Maffucci, PharmD, [place] [phone]

## 2020-01-01 NOTE — Patient Instructions (Signed)
Visit Information  Goals Addressed            This Visit's Progress     Patient Stated   . "I want to work on my diabetes" (pt-stated)       CARE PLAN ENTRY (see longtitudinal plan of care for additional care plan information)  Current Barriers:  . Diabetes: uncontrolled; complicated by chronic medical conditions including HTN, CKD, most recent A1c 7.5% . Most recent eGFR: ~61 mL/min . Current antihyperglycemic regimen: none  o Hx metformin XR; significant nausea . Current meal patterns: o Breakfast: pancakes today (but not regularly); scrambled eggs; bacon; sometimes cereal; coffee  o Lunch/supper : meats + vegetables o Drinks: Water; milk, avoids sodas; sugar free lemonade  . Current exercise: rides his bike quite a bit when weather  . Current blood glucose readings:  o Fastings: 160-180s . Cardiovascular risk reduction: o Current hypertensive regimen: amlodipine 10 mg after lunch, carvedilol 12 mg BID (though prescribed 1.5 tabs BID); HCTZ 12.5 mg QAM, losartan 100 mg QAM - Home BP 130s/70s; HR: 58-60s  o Current hyperlipidemia regimen: rosuvastatin 20 mg daily (just changed from simvastatin, denies any muscle side effects); last LDL 75 o Current antiplatelet regimen: none . 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 Wonders if these things preclude him from vaccination  Pharmacist Clinical Goal(s):  Marland Kitchen Over the next 90 days, patient will work with PharmD and primary care provider to address optimized medication management  Interventions: . Comprehensive medication review performed, medication list updated in electronic medical record . Educated on goal A1c, goal fasting, and goal 2 hour post prandial glucose  . Discussed benefits of SGLT2, including glycemic, renal, and cardiovascular. Discussed common side effects, including genitourinary infections. Patient in agreement  to start Jardiance 10 mg daily. Counseled to take in the morning and remain well hydrated.  . Discussed COVID vaccine contraindication of hx anaphylaxis to PEG (as some contrast dyes do include PEG). Patient notes he has had ~5 colonoscopies and no issues w/ bowel prep PEG. Discussed that he should plan to wait 30 minutes for observation after COVID vaccination.  . Discussed hx angioma. Patient expressed concerns about the sx of HA after vaccination, and how that may be related to vasodilation in the brain, and his concerns of this being a problem with his angioma, as he was previously counseled to avoid PDE5i for ED d/t vasodilation. Discussed COVID vaccine side effects as part of the natural immune response, and that there is no data to suggest that COVID vaccination could cause significant brain vasodilation. Also discussed peripheral vasodilation of his antihypertensives and principals of the blood brain barrier. Patient verbalized understanding and appreciation.  . Patient reports he has been taking carvedilol 12.5 mg  BID (1 tablet BID instead of 1.5 tab BID) for months. BP and HR remain well controlled. Updated this prescription to reflect use. Will continue to monitor BP/HR moving forward and any need to adjust his regimen.   Patient Self Care Activities:  . Patient will check blood glucose QD-BID, document, and provide at future appointments . Patient will take medications as prescribed . Patient will report any questions or concerns to provider   Initial goal documentation        Patient verbalizes understanding of instructions provided today.   Plan: - Scheduled f/u call 02/02/20  Catie Darnelle Maffucci, PharmD, [place] [phone]

## 2020-01-05 ENCOUNTER — Other Ambulatory Visit: Payer: Self-pay

## 2020-01-05 ENCOUNTER — Other Ambulatory Visit (INDEPENDENT_AMBULATORY_CARE_PROVIDER_SITE_OTHER): Payer: Medicare Other

## 2020-01-05 ENCOUNTER — Telehealth: Payer: Self-pay | Admitting: Family Medicine

## 2020-01-05 DIAGNOSIS — E785 Hyperlipidemia, unspecified: Secondary | ICD-10-CM

## 2020-01-05 LAB — HEPATIC FUNCTION PANEL
ALT: 22 U/L (ref 0–53)
AST: 15 U/L (ref 0–37)
Albumin: 4.3 g/dL (ref 3.5–5.2)
Alkaline Phosphatase: 60 U/L (ref 39–117)
Bilirubin, Direct: 0.2 mg/dL (ref 0.0–0.3)
Total Bilirubin: 0.9 mg/dL (ref 0.2–1.2)
Total Protein: 6.8 g/dL (ref 6.0–8.3)

## 2020-01-05 LAB — LDL CHOLESTEROL, DIRECT: Direct LDL: 44 mg/dL

## 2020-01-05 NOTE — Telephone Encounter (Signed)
Pt called in and said he tried calling you this morning but didn't reach you. Please call patient.

## 2020-01-05 NOTE — Telephone Encounter (Signed)
I did not receive a voicemail from this patient. Will call back later this week

## 2020-01-06 ENCOUNTER — Ambulatory Visit: Payer: Self-pay | Admitting: Pharmacist

## 2020-01-06 DIAGNOSIS — I10 Essential (primary) hypertension: Secondary | ICD-10-CM

## 2020-01-06 DIAGNOSIS — E119 Type 2 diabetes mellitus without complications: Secondary | ICD-10-CM | POA: Diagnosis not present

## 2020-01-06 NOTE — Patient Instructions (Signed)
Visit Information  Goals Addressed            This Visit's Progress     Patient Stated   . "I want to work on my diabetes" (pt-stated)       CARE PLAN ENTRY (see longtitudinal plan of care for additional care plan information)  Current Barriers:  . Diabetes: uncontrolled; complicated by chronic medical conditions including HTN, CKD, most recent A1c 7.5% o Calls today noting  . Most recent eGFR: ~61 mL/min . Current antihyperglycemic regimen: none  o Hx metformin XR; significant nause . Cardiovascular risk reduction: o Current hypertensive regimen: amlodipine 10 mg after lunch, carvedilol 12.5 mg BID (though prescribed 1.5 tabs BID); HCTZ 12.5 mg QAM, losartan 100 mg QAM - Home BP 130s/70s; HR: 58-60s  o Current hyperlipidemia regimen: rosuvastatin 20 mg daily (just changed from simvastatin, denies any muscle side effects); last LDL 75 o Current antiplatelet regimen: none . 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 Wonders if these things preclude him from vaccination  Pharmacist Clinical Goal(s):  Marland Kitchen Over the next 90 days, patient will work with PharmD and primary care provider to address optimized medication management  Interventions: . Patient notes that Jardiance copay is going to be >$600 from mail order pharmacy. He is unsure if he has a deductible on his plan. Notes today that he has a VA community plan that may provide some coverage for medications. He is going to contact them to see if they can provide Jardiance. If patient does have VA coverage for medications, we will be unable to pursue patient assistance through the drug company. He will call me back once he has spoken to the [place]  Patient Self Care Activities:  . Patient will check blood glucose QD-BID, document, and provide at future appointments . Patient will take medications as prescribed . Patient will report any  questions or concerns to provider   Please see past updates related to this goal by clicking on the "Past Updates" button in the selected goal         Patient verbalizes understanding of instructions provided today.   Plan:  - Will await call back from patient  Catie Darnelle Maffucci, PharmD, [place], [place] Pharmacist [place] [phone]

## 2020-01-06 NOTE — Chronic Care Management (AMB) (Signed)
Chronic Care Management   Follow Up Note   01/06/2020 Name: Jesus Peterson MRN: [id number] DOB: 02/24/1946  Referred by: [name], MD Reason for referral : Chronic Care Management (Medication Management)   Jesus Peterson is a 74 y.o. year old male who is a primary care patient of [name], [name], MD. The CCM team was consulted for assistance with chronic disease management and care coordination needs.    Received message that patient had a question for me.  Review of patient status, including review of consultants reports, relevant laboratory and other test results, and collaboration with appropriate care team members and the patient's provider was performed as part of comprehensive patient evaluation and provision of chronic care management services.    SDOH (Social Determinants of Health) assessments performed: Yes See Care Plan activities for detailed interventions related to [facility])     Outpatient Encounter Medications as of 01/06/2020  Medication Sig  . amLODipine (NORVASC) 10 MG tablet TAKE 1 TABLET EVERY DAY  . Bayer Microlet Lancets lancets USE  TWICE DAILY ICD 10 E11.9  . blood glucose meter kit and supplies KIT Dispense based on patient and insurance preference. Check once daily fasting, Dx Code E11.9  . Blood Pressure Monitoring (ADULT BLOOD PRESSURE CUFF LG) KIT Use daily to check blood pressure  . carvedilol (COREG) 12.5 MG tablet Take 1 tablet (12.5 mg total) by mouth 2 (two) times daily with a meal.  . empagliflozin (JARDIANCE) 10 MG TABS tablet Take 10 mg by mouth daily before breakfast.  . GARLIC OIL PO Take 1 capsule by mouth 4 (four) times daily.  Marland Kitchen glucose blood (BAYER CONTOUR NEXT TEST) test strip USE  STRIP TO CHECK GLUCOSE TWICE DAILY E11.9  . hydrochlorothiazide (HYDRODIURIL) 12.5 MG tablet Take 1 tablet (12.5 mg total) by mouth daily.  Marland Kitchen losartan (COZAAR) 100 MG tablet Take 1 tablet (100 mg total) by mouth daily.  . rosuvastatin (CRESTOR) 20 MG  tablet Take 1 tablet (20 mg total) by mouth daily.   No facility-administered encounter medications on file as of 01/06/2020.     Objective:   Goals Addressed            This Visit's Progress     Patient Stated   . "I want to work on my diabetes" (pt-stated)       CARE PLAN ENTRY (see longtitudinal plan of care for additional care plan information)  Current Barriers:  . Diabetes: uncontrolled; complicated by chronic medical conditions including HTN, CKD, most recent A1c 7.5% o Calls today noting  . Most recent eGFR: ~61 mL/min . Current antihyperglycemic regimen: none  o Hx metformin XR; significant nause . Cardiovascular risk reduction: o Current hypertensive regimen: amlodipine 10 mg after lunch, carvedilol 12.5 mg BID (though prescribed 1.5 tabs BID); HCTZ 12.5 mg QAM, losartan 100 mg QAM - Home BP 130s/70s; HR: 58-60s  o Current hyperlipidemia regimen: rosuvastatin 20 mg daily (just changed from simvastatin, denies any muscle side effects); last LDL 75 o Current antiplatelet regimen: none . 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 Wonders if these things preclude him from vaccination  Pharmacist Clinical Goal(s):  Marland Kitchen Over the next 90 days, patient will work with PharmD and primary care provider to address optimized medication management  Interventions: . Patient notes that Jardiance copay is going to be >$600 from mail order pharmacy. He is unsure if he has a  deductible on his plan. Notes today that he has a VA community plan that may provide some coverage for medications. He is going to contact them to see if they can provide Jardiance. If patient does have VA coverage for medications, we will be unable to pursue patient assistance through the drug company. He will call me back once he has spoken to the [place]  Patient Self Care Activities:  . Patient will check blood glucose  QD-BID, document, and provide at future appointments . Patient will take medications as prescribed . Patient will report any questions or concerns to provider   Please see past updates related to this goal by clicking on the "Past Updates" button in the selected goal          Plan:  - Will await call back from patient  Catie Darnelle Maffucci, PharmD, [place], [place] Pharmacist [place] [phone]

## 2020-01-15 ENCOUNTER — Telehealth: Payer: Self-pay | Admitting: Family Medicine

## 2020-01-15 DIAGNOSIS — E119 Type 2 diabetes mellitus without complications: Secondary | ICD-10-CM

## 2020-01-15 MED ORDER — CARVEDILOL 12.5 MG PO TABS: 12.5000 mg | ORAL_TABLET | Freq: Two times a day (BID) | ORAL | 0 refills | Status: DC

## 2020-01-15 NOTE — Telephone Encounter (Signed)
Pt called in and said that he hasn't gotten the prescription carvedilol from [facility]. They told him it would take 5-7 days. He is currently out. He is wondering if we can call in a prescription to [facility] until he gets the prescription from [facility]? He would like a call back.

## 2020-01-16 ENCOUNTER — Telehealth: Payer: Self-pay | Admitting: Family Medicine

## 2020-01-16 DIAGNOSIS — E119 Type 2 diabetes mellitus without complications: Secondary | ICD-10-CM

## 2020-01-16 MED ORDER — CARVEDILOL 12.5 MG PO TABS: 12.5000 mg | ORAL_TABLET | Freq: Two times a day (BID) | ORAL | 0 refills | Status: DC

## 2020-01-16 NOTE — Telephone Encounter (Signed)
Patient called needing a refill on his coreg. This has been sent to his local pharmacy.

## 2020-01-23 ENCOUNTER — Other Ambulatory Visit: Payer: Self-pay | Admitting: Family Medicine

## 2020-01-23 DIAGNOSIS — E785 Hyperlipidemia, unspecified: Secondary | ICD-10-CM

## 2020-02-02 ENCOUNTER — Ambulatory Visit: Payer: Self-pay | Admitting: Pharmacist

## 2020-02-02 ENCOUNTER — Telehealth: Payer: Medicare Other

## 2020-02-02 DIAGNOSIS — L923 Foreign body granuloma of the skin and subcutaneous tissue: Secondary | ICD-10-CM | POA: Diagnosis not present

## 2020-02-02 NOTE — Chronic Care Management (AMB) (Signed)
  Chronic Care Management   Note  02/02/2020 Name: Jesus Peterson MRN: [id number] DOB: Aug 21, 1946  Jesus Peterson is a 74 y.o. year old male who is a primary care patient of [name], [name], MD. The CCM team was consulted for assistance with chronic disease management and care coordination needs.    Attempted to contact patient for medication management review. Left HIPAA compliant message for patient to return my call at their convenience.   Plan: - Will collaborate with Care Guide to outreach to schedule follow up with me  Catie Darnelle Maffucci, PharmD, [place], [place] Pharmacist [place] [phone]

## 2020-02-03 ENCOUNTER — Telehealth: Payer: Self-pay | Admitting: Family Medicine

## 2020-02-03 NOTE — Chronic Care Management (AMB) (Signed)
  Care Management   Note  02/03/2020 Name: Jesus Peterson MRN: [id number] DOB: 1946/08/12  Jesus Peterson is a 74 y.o. year old male who is a primary care patient of [name], MD and is actively engaged with the care management team. I reached out to [place] Main by phone today to assist with re-scheduling a follow up visit with the Pharmacist  Follow up plan: Unsuccessful telephone outreach attempt made. A HIPPA compliant phone message was left for the patient providing contact information and requesting a return call.  The care management team will reach out to the patient again over the next 7 days.  If patient returns call to provider office, please advise to call [place]  at [place], [place], [place], [place] [address] Direct Dial: [phone] [email or web address] Website: [email or web address]

## 2020-02-04 NOTE — Chronic Care Management (AMB) (Signed)
  Care Management   Note  02/04/2020 Name: DEAVEON LASSONDE MRN: [id number] DOB: Feb 01, 1946  KOREN IAQUINTO is a 74 y.o. year old male who is a primary care patient of [name], MD and is actively engaged with the care management team. I reached out to [place] Main by phone today to assist with re-scheduling a follow up visit with the Pharmacist  Follow up plan: Telephone appointment with care management team member scheduled for:03/22/2020  Noreene Larsson, [place], [place], [place] [address] Direct Dial: [phone] [email or web address] Website: [email or web address]

## 2020-02-25 DIAGNOSIS — Z23 Encounter for immunization: Secondary | ICD-10-CM | POA: Diagnosis not present

## 2020-03-06 ENCOUNTER — Other Ambulatory Visit: Payer: Self-pay | Admitting: Family Medicine

## 2020-03-06 DIAGNOSIS — I10 Essential (primary) hypertension: Secondary | ICD-10-CM

## 2020-03-17 ENCOUNTER — Telehealth: Payer: Self-pay

## 2020-03-18 NOTE — Telephone Encounter (Signed)
Signed. Given to Gae Bon to fill in office information. There is information needed from the patient as well.

## 2020-03-18 NOTE — Telephone Encounter (Signed)
Can you call the [employer] and/or the patient and see what this form is related to? Is to provide coverage to see me or are they requesting other additional services? If it is to request other services then I need to know what they are requesting. There is not any information on the fax indicating what this is for.

## 2020-03-18 NOTE — Telephone Encounter (Signed)
I called the VA to see what the form they sent was for and it was for provider services because his is about to expire and he has an appointment in July with the provider. I informed the provider.   Lyndon Chenoweth,cma

## 2020-03-18 NOTE — Telephone Encounter (Signed)
I filled out the form with the office information and faxed it to the [place]. Confirmation was given.  Calley Drenning,cma

## 2020-03-19 ENCOUNTER — Other Ambulatory Visit: Payer: Self-pay

## 2020-03-22 ENCOUNTER — Ambulatory Visit (INDEPENDENT_AMBULATORY_CARE_PROVIDER_SITE_OTHER): Payer: Medicare Other | Admitting: Pharmacist

## 2020-03-22 DIAGNOSIS — E1165 Type 2 diabetes mellitus with hyperglycemia: Secondary | ICD-10-CM | POA: Diagnosis not present

## 2020-03-22 DIAGNOSIS — I10 Essential (primary) hypertension: Secondary | ICD-10-CM

## 2020-03-22 NOTE — Chronic Care Management (AMB) (Signed)
Chronic Care Management   Follow Up Note   03/22/2020 Name: Jesus Peterson MRN: [id number] DOB: January 14, 1946  Referred by: [name], MD Reason for referral : Chronic Care Management (Medication Management)   Jesus Peterson is a 74 y.o. year old male who is a primary care patient of [name], [name], MD. The CCM team was consulted for assistance with chronic disease management and care coordination needs.    Contacted patient for medication management review.   Review of patient status, including review of consultants reports, relevant laboratory and other test results, and collaboration with appropriate care team members and the patient's provider was performed as part of comprehensive patient evaluation and provision of chronic care management services.    SDOH (Social Determinants of Health) assessments performed: Yes See Care Plan activities for detailed interventions related to [facility])     Outpatient Encounter Medications as of 03/22/2020  Medication Sig  . amLODipine (NORVASC) 10 MG tablet TAKE 1 TABLET EVERY DAY  . Bayer Microlet Lancets lancets USE  TWICE DAILY ICD 10 E11.9  . blood glucose meter kit and supplies KIT Dispense based on patient and insurance preference. Check once daily fasting, Dx Code E11.9  . Blood Pressure Monitoring (ADULT BLOOD PRESSURE CUFF LG) KIT Use daily to check blood pressure  . carvedilol (COREG) 12.5 MG tablet Take 1 tablet (12.5 mg total) by mouth 2 (two) times daily with a meal.  . empagliflozin (JARDIANCE) 10 MG TABS tablet Take 10 mg by mouth daily before breakfast. (Patient not taking: Reported on 03/22/2020)  . GARLIC OIL PO Take 1 capsule by mouth 4 (four) times daily.  Marland Kitchen glucose blood (BAYER CONTOUR NEXT TEST) test strip USE  STRIP TO CHECK GLUCOSE TWICE DAILY E11.9  . hydrochlorothiazide (HYDRODIURIL) 12.5 MG tablet TAKE 1 TABLET EVERY DAY  . losartan (COZAAR) 100 MG tablet Take 1 tablet (100 mg total) by mouth daily.  . rosuvastatin  (CRESTOR) 20 MG tablet TAKE 1 TABLET (20 MG TOTAL) BY MOUTH DAILY.   No facility-administered encounter medications on file as of 03/22/2020.     Objective:   Goals Addressed            This Visit's Progress     Patient Stated   . "I want to work on my diabetes" (pt-stated)       [place] (see longtitudinal plan of care for additional care plan information)  Current Barriers:  . Diabetes: uncontrolled; complicated by chronic medical conditions including HTN, CKD, most recent A1c 7.5% o Notes that he contacted the [place] and Jardiance would be affordable through them, has not started yet. Notes he has concerns about the side effects and risks noted on commercials.  . Most recent eGFR: ~61 mL/min . Current antihyperglycemic regimen: none  o Hx metformin XR; significant nausea preventing use . Current glucose readings:  o Fasting 150-160s, though reports that he notes some inaccuracy w/ BG machine, will check readings and get 200s then 170s back to back  . Cardiovascular risk reduction: o Current hypertensive regimen: amlodipine 10 mg after lunch, carvedilol 12.5 mg BID (though prescribed 1.5 tabs BID); HCTZ 12.5 mg QAM, losartan 100 mg QAM - Most recently home BP 127/67, rate 59 o Current hyperlipidemia regimen: rosuvastatin 20 mg daily; due for LDL recheck s/p changing to rosuvastatin; last LDL 75 o Current antiplatelet regimen: none . COVID vaccination questions: reports he got his first [place] shot Levan Hurst), scheduled for second next weke  Pharmacist Clinical Goal(s):  .  Over the next 90 days, patient will work with PharmD and primary care provider to address optimized medication management  Interventions: . Comprehensive medication review performed, medication list updated in electronic medical record . Inter-disciplinary care team collaboration (see longitudinal plan of care) . Reviewed benefits vs side effects of SGLT2. Reviewed microvascular and macrovascular risks of  uncontrolled diabetes. Patient agreeable to start SGLT2. [facility] to [facility]. Encouraged him to provide my contact information for any questions/concerns . Discussed that he should contact the VA to request a new glucometer if he has concerns about accuracy. He notes he will do this today . Reviewed goal A1c, goal fasting, goal 2 hour post prandial glucoses . Reviewed goal BP  Patient Self Care Activities:  . Patient will check blood glucose QD-BID, document, and provide at future appointments . Patient will take medications as prescribed . Patient will report any questions or concerns to provider   Please see past updates related to this goal by clicking on the "Past Updates" button in the selected goal          Plan:  - Scheduled f/u call in ~ 8 weeks  Catie Travis, PharmD, BCACP, CPP Clinical Pharmacist [facility] [phone]   

## 2020-03-22 NOTE — Patient Instructions (Signed)
Visit Information  Goals Addressed            This Visit's Progress     Patient Stated   . "I want to work on my diabetes" (pt-stated)       [place] (see longtitudinal plan of care for additional care plan information)  Current Barriers:  . Diabetes: uncontrolled; complicated by chronic medical conditions including HTN, CKD, most recent A1c 7.5% o Notes that he contacted the [place] and Jardiance would be affordable through them, has not started yet. Notes he has concerns about the side effects and risks noted on commercials.  . Most recent eGFR: ~61 mL/min . Current antihyperglycemic regimen: none  o Hx metformin XR; significant nausea preventing use . Current glucose readings:  o Fasting 150-160s, though reports that he notes some inaccuracy w/ BG machine, will check readings and get 200s then 170s back to back  . Cardiovascular risk reduction: o Current hypertensive regimen: amlodipine 10 mg after lunch, carvedilol 12.5 mg BID (though prescribed 1.5 tabs BID); HCTZ 12.5 mg QAM, losartan 100 mg QAM - Most recently home BP 127/67, rate 59 o Current hyperlipidemia regimen: rosuvastatin 20 mg daily; due for LDL recheck s/p changing to rosuvastatin; last LDL 75 o Current antiplatelet regimen: none . COVID vaccination questions: reports he got his first [place] shot Levan Hurst), scheduled for second next weke  Pharmacist Clinical Goal(s):  Marland Kitchen Over the next 90 days, patient will work with PharmD and primary care provider to address optimized medication management  Interventions: . Comprehensive medication review performed, medication list updated in electronic medical record . Inter-disciplinary care team collaboration (see longitudinal plan of care) . Reviewed benefits vs side effects of SGLT2. Reviewed microvascular and macrovascular risks of uncontrolled diabetes. Patient agreeable to start SGLT2. I encouraged him to contact [facility]) to ask his provider there to send  script for [place] to [facility] pharmacy. Encouraged him to provide my contact information for any questions/concerns . Discussed that he should contact the [place] to request a new glucometer if he has concerns about accuracy. He notes he will do this today . Reviewed goal A1c, goal fasting, goal 2 hour post prandial glucoses . Reviewed goal BP  Patient Self Care Activities:  . Patient will check blood glucose QD-BID, document, and provide at future appointments . Patient will take medications as prescribed . Patient will report any questions or concerns to provider   Please see past updates related to this goal by clicking on the "Past Updates" button in the selected goal         Patient verbalizes understanding of instructions provided today.   Plan:  - Scheduled f/u call in ~ 8 weeks  Catie Darnelle Maffucci, PharmD, [place], [place] Pharmacist [place] [phone]

## 2020-03-24 DIAGNOSIS — Z23 Encounter for immunization: Secondary | ICD-10-CM | POA: Diagnosis not present

## 2020-03-31 ENCOUNTER — Other Ambulatory Visit: Payer: Self-pay | Admitting: Family Medicine

## 2020-03-31 DIAGNOSIS — E785 Hyperlipidemia, unspecified: Secondary | ICD-10-CM

## 2020-04-06 ENCOUNTER — Other Ambulatory Visit: Payer: Self-pay

## 2020-04-06 ENCOUNTER — Telehealth: Payer: Self-pay | Admitting: Family Medicine

## 2020-04-06 ENCOUNTER — Ambulatory Visit
Admission: EM | Admit: 2020-04-06 | Discharge: 2020-04-06 | Disposition: A | Discharge status: Home / Self Care | Payer: Medicare Other | Attending: Emergency Medicine | Admitting: Emergency Medicine

## 2020-04-06 DIAGNOSIS — R739 Hyperglycemia, unspecified: Secondary | ICD-10-CM | POA: Diagnosis not present

## 2020-04-06 DIAGNOSIS — E1165 Type 2 diabetes mellitus with hyperglycemia: Secondary | ICD-10-CM

## 2020-04-06 DIAGNOSIS — R42 Dizziness and giddiness: Secondary | ICD-10-CM

## 2020-04-06 LAB — POCT FASTING CBG KUC MANUAL ENTRY: POCT Glucose (KUC): 197 mg/dL — AB (ref 70–99)

## 2020-04-06 NOTE — ED Triage Notes (Signed)
Patient states that he woke up this morning and had a unsteady balance. Patient states that this has never had any thing like this before. Reports that he is slightly nausea.

## 2020-04-06 NOTE — Telephone Encounter (Signed)
Pt called in has dizziness, trouble keeping balance, and nausea. Transferred to [employer].

## 2020-04-06 NOTE — Discharge Instructions (Addendum)
Go to the Emergency Department if you have acute dizziness or other concerning symptoms such as chest pain or shortness of breath.    Call your primary care provider to schedule an appointment for tomorrow.    Your lab work will be back tomorrow.  I will call you with the results.    Your EKG looks similar to the previous one in 2015.    Your fasting blood sugar is too high at 197.  Please talk to your PCP about this.

## 2020-04-06 NOTE — Telephone Encounter (Signed)
Noted. Agree with need for evaluation at urgent care.

## 2020-04-06 NOTE — Telephone Encounter (Signed)
Spoken to patient. Patient woke up and felt dizzy once he stood up. The dizziness has been continuous, nausea starts once dizziness starts, he is feeling off balance even when patient is sitting very still. Patient does not have a headaches, SOB, facial droop, arm px, and jaw px. Due to no appointment available I instructed patient to go to UC/ED. He stated he would comply.

## 2020-04-06 NOTE — ED Provider Notes (Signed)
Jesus Peterson    CSN: [id number] Arrival date & time: 04/06/20  0841      History   Chief Complaint Chief Complaint  Patient presents with  . Dizziness    HPI Jesus Peterson is a 74 y.o. male.  Patient presents with dizziness when he woke up this morning. The episode lasted approximately 1 hour. He states he felt "off balance" and had to hold the wall to get from his bedroom to his chair in the living room. He also reports nausea during this episode. He states his symptoms have resolved now. He denies headache, weakness, numbness, chest pain, shortness of breath, abdominal pain, vomiting, diarrhea, edema, rash, or other symptoms. No treatment attempted at home. He reports no previous similar episodes. He states he and his wife went out to celebrate their anniversary last evening and he ate a slice of pie;  he does not normally eat sweets due to his diabetes.  The history is provided by the patient.    Past Medical History:  Diagnosis Date  . Cancer (HCC)    MELANOMA OF SKIN  . Chronic kidney disease   . Erectile dysfunction   . History of kidney stones   . Hyperlipidemia   . Hypertension   . Seborrheic dermatitis   . Treadmill stress test negative for angina pectoris 2013   Dr. Nehemiah Massed, normal per pt  . Valvular heart disease   . Venous angioma of brain ([place])    Pons, found 2/2 tinnitus, followed on MRI    Patient Active Problem List   Diagnosis Date Noted  . Chronic kidney disease 10/08/2019  . Eczema 09/23/2019  . Chronic cough 07/23/2019  . Skin infection 07/23/2019  . Seborrheic dermatitis 07/22/2018  . Prostate cancer screening 04/08/2018  . Melanoma in situ of cheek ([place]) 12/24/2017  . History of colon polyps 12/24/2017  . History of malignant melanoma of skin 12/11/2017  . Right upper quadrant abdominal pain 09/21/2017  . Toenail deformity 06/26/2017  . Hearing difficulty of both ears 03/26/2017  . Left foot pain 02/05/2017  . Diarrhea 02/05/2017   . Neck pain 10/18/2016  . Osteoarthritis of left hand 06/30/2016  . Angioma 06/30/2016  . Obstructive sleep apnea 07/03/2014  . Valvular heart disease 05/11/2014  . Nonspecific abnormal electrocardiogram (ECG) (EKG) 02/06/2014  . Diabetes type 2, controlled ([place]) 08/11/2013  . GERD (gastroesophageal reflux disease) 04/05/2012  . Hypertension 03/04/2012  . Erectile dysfunction 03/04/2012  . Hyperlipidemia 03/04/2012    Past Surgical History:  Procedure Laterality Date  . COLONOSCOPY  08/24/2006,08/23/2012  . COLONOSCOPY WITH PROPOFOL N/A 09/09/2018   Procedure: COLONOSCOPY WITH PROPOFOL;  Surgeon: Lollie Sails, MD;  Location: [facility] ENDOSCOPY;  Service: Endoscopy;  Laterality: N/A;       Home Medications    Prior to Admission medications   Medication Sig Start Date End Date Taking? Authorizing Provider  amLODipine (NORVASC) 10 MG tablet TAKE 1 TABLET EVERY DAY 09/26/19  Yes [name], MD  Bayer Microlet Lancets lancets USE  TWICE DAILY ICD 10 E11.9 05/28/19  Yes [name], MD  blood glucose meter kit and supplies KIT Dispense based on patient and insurance preference. Check once daily fasting, Dx Code E11.9 12/24/17  Yes [name], MD  Blood Pressure Monitoring (ADULT BLOOD PRESSURE CUFF LG) KIT Use daily to check blood pressure 11/11/17  Yes [name], MD  carvedilol (COREG) 12.5 MG tablet Take 1 tablet (12.5 mg total) by mouth 2 (two)  times daily with a meal. 01/16/20  Yes Sonnenberg, [name], MD  GARLIC OIL PO Take 1 capsule by mouth 4 (four) times daily.   Yes [provider]  glucose blood (BAYER CONTOUR NEXT TEST) test strip USE  STRIP TO CHECK GLUCOSE TWICE DAILY E11.9 10/30/19  Yes [name], MD  hydrochlorothiazide (HYDRODIURIL) 12.5 MG tablet TAKE 1 TABLET EVERY DAY 03/08/20  Yes [name], MD  losartan (COZAAR) 100 MG tablet Take 1 tablet (100 mg total) by mouth daily. 01/17/19  Yes [name], MD   rosuvastatin (CRESTOR) 20 MG tablet TAKE 1 TABLET (20 MG TOTAL) BY MOUTH DAILY. 04/01/20  Yes [name], MD  empagliflozin (JARDIANCE) 10 MG TABS tablet Take 10 mg by mouth daily before breakfast. Patient not taking: Reported on 03/22/2020 01/01/20   [name], MD    Family History Family History  Problem Relation Age of Onset  . Stroke Mother   . Heart disease Mother        s/p CABG  . Heart disease Father   . Heart disease Brother        s/p CABG  . Heart disease Brother        s/p angioplasty  . Arthritis Brother     Social History Social History   Tobacco Use  . Smoking status: Never Smoker  . Smokeless tobacco: Never Used  Vaping Use  . Vaping Use: Never used  Substance Use Topics  . Alcohol use: No  . Drug use: Never     Allergies   Contrast media [iodinated diagnostic agents] and Penicillins   Review of Systems Review of Systems  Constitutional: Negative for chills and fever.  HENT: Negative for ear pain and sore throat.   Eyes: Negative for pain and visual disturbance.  Respiratory: Negative for cough and shortness of breath.   Cardiovascular: Negative for chest pain and palpitations.  Gastrointestinal: Positive for nausea. Negative for abdominal pain and vomiting.  Genitourinary: Negative for dysuria and hematuria.  Musculoskeletal: Negative for arthralgias and back pain.  Skin: Negative for color change and rash.  Neurological: Positive for dizziness. Negative for seizures, syncope, facial asymmetry, speech difficulty, weakness and numbness.  All other systems reviewed and are negative.    Physical Exam Triage Vital Signs ED Triage Vitals  Enc Vitals Group     BP      Pulse      Resp      Temp      Temp src      SpO2      Weight      Height      Head Circumference      Peak Flow      Pain Score      Pain Loc      Pain Edu?      Excl. in [place]?    Orthostatic VS for the past 24 hrs:  BP- Lying Pulse- Lying BP- Sitting  Pulse- Sitting BP- Standing at 0 minutes Pulse- Standing at 0 minutes  04/06/20 0907 145/87 59 138/83 59 134/85 65    Updated Vital Signs BP 137/86 (BP Location: Left Arm)   Pulse (!) 58   Temp 98.1 F (36.7 C) (Oral)   Resp 18   Ht '5\' 11"'$  (1.803 m)   Wt 228 lb (103.4 kg)   SpO2 98%   BMI 31.80 kg/m   Visual Acuity Right Eye Distance:   Left Eye Distance:   Bilateral Distance:  Right Eye Near:   Left Eye Near:    Bilateral Near:     Physical Exam Vitals and nursing note reviewed.  Constitutional:      General: He is not in acute distress.    Appearance: He is well-developed. He is not ill-appearing.  HENT:     Head: Normocephalic and atraumatic.     Right Ear: Tympanic membrane and ear canal normal.     Left Ear: Tympanic membrane and ear canal normal.     Nose: Nose normal.     Mouth/Throat:     Mouth: Mucous membranes are moist.     Pharynx: Oropharynx is clear.  Eyes:     Conjunctiva/sclera: Conjunctivae normal.  Cardiovascular:     Rate and Rhythm: Normal rate and regular rhythm.     Heart sounds: Normal heart sounds. No murmur heard.   Pulmonary:     Effort: Pulmonary effort is normal. No respiratory distress.     Breath sounds: Normal breath sounds. No wheezing or rhonchi.  Abdominal:     Palpations: Abdomen is soft.     Tenderness: There is no abdominal tenderness. There is no guarding or rebound.  Musculoskeletal:     Cervical back: Neck supple.     Right lower leg: No edema.     Left lower leg: No edema.  Skin:    General: Skin is warm and dry.     Findings: No rash.  Neurological:     General: No focal deficit present.     Mental Status: He is alert and oriented to person, place, and time.     Cranial Nerves: No cranial nerve deficit.     Sensory: No sensory deficit.     Motor: No weakness.     Coordination: Coordination normal.     Gait: Gait normal.  Psychiatric:        Mood and Affect: Mood normal.        Behavior: Behavior normal.       UC Treatments / Results  Labs (all labs ordered are listed, but only abnormal results are displayed) Labs Reviewed  POCT FASTING CBG [facility] MANUAL ENTRY - Abnormal; Notable for the following components:      Result Value   POCT Glucose ([facility]) 197 (*)    All other components within normal limits  CBC  COMPREHENSIVE METABOLIC PANEL    EKG   Radiology No results found.  Procedures Procedures (including critical care time)  Medications Ordered in UC Medications - No data to display  Initial Impression / Assessment and Plan / UC Course  I have reviewed the triage vital signs and the nursing notes.  Pertinent labs & imaging results that were available during my care of the patient were reviewed by me and considered in my medical decision making (see chart for details).   Dizziness; DM with hyperglycemia.  Patient is well-appearing and asymptomatic at this time; His exam is reassuring.  CBG 197 fasting.  EKG shows sinus rhythm, rate 60, no ST elevation, compared to previous from 2015.  Patient is unable to provide urine specimen.  CBC and CMP pending.  Education provided about dizziness, safety, and hydration.  Education provided about hyperglycemia and diabetes control.  Instructed patient to follow up with his PCP tomorrow.  Strict instructions for ED discussed.  Patient agrees to plan of care.     Final Clinical Impressions(s) / UC Diagnoses   Final diagnoses:  Dizziness  Hyperglycemia  Type 2 diabetes mellitus with hyperglycemia,  without long-term current use of insulin [facility])     Discharge Instructions     Go to the Emergency Department if you have acute dizziness or other concerning symptoms such as chest pain or shortness of breath.    Call your primary care provider to schedule an appointment for tomorrow.    Your lab work will be back tomorrow.  I will call you with the results.    Your EKG looks similar to the previous one in 2015.    Your fasting blood sugar  is too high at 197.  Please talk to your PCP about this.        ED Prescriptions    None     PDMP not reviewed this encounter.   Sharion Balloon, NP 04/06/20 [phone]

## 2020-04-07 ENCOUNTER — Encounter: Payer: Self-pay | Admitting: Family Medicine

## 2020-04-07 ENCOUNTER — Ambulatory Visit (INDEPENDENT_AMBULATORY_CARE_PROVIDER_SITE_OTHER): Payer: Medicare Other | Admitting: Family Medicine

## 2020-04-07 ENCOUNTER — Telehealth: Payer: Self-pay | Admitting: Emergency Medicine

## 2020-04-07 VITALS — BP 140/80 | HR 64 | Temp 97.6°F | Ht 70.0 in | Wt 231.8 lb

## 2020-04-07 DIAGNOSIS — I10 Essential (primary) hypertension: Secondary | ICD-10-CM | POA: Diagnosis not present

## 2020-04-07 DIAGNOSIS — R42 Dizziness and giddiness: Secondary | ICD-10-CM | POA: Diagnosis not present

## 2020-04-07 DIAGNOSIS — E119 Type 2 diabetes mellitus without complications: Secondary | ICD-10-CM

## 2020-04-07 LAB — CBC
Hematocrit: 49.6 % (ref 37.5–51.0)
Hemoglobin: 16.2 g/dL (ref 13.0–17.7)
MCH: 28.6 pg (ref 26.6–33.0)
MCHC: 32.7 g/dL (ref 31.5–35.7)
MCV: 88 fL (ref 79–97)
Platelets: 144 10*3/uL — ABNORMAL LOW (ref 150–450)
RBC: 5.66 x10E6/uL (ref 4.14–5.80)
RDW: 13.6 % (ref 11.6–15.4)
WBC: 7.9 10*3/uL (ref 3.4–10.8)

## 2020-04-07 LAB — COMPREHENSIVE METABOLIC PANEL
ALT: 21 IU/L (ref 0–44)
AST: 17 IU/L (ref 0–40)
Albumin/Globulin Ratio: 1.7 (ref 1.2–2.2)
Albumin: 4.2 g/dL (ref 3.7–4.7)
Alkaline Phosphatase: 64 IU/L (ref 48–121)
BUN/Creatinine Ratio: 23 (ref 10–24)
BUN: 22 mg/dL (ref 8–27)
Bilirubin Total: 0.5 mg/dL (ref 0.0–1.2)
CO2: 19 mmol/L — ABNORMAL LOW (ref 20–29)
Calcium: 8.8 mg/dL (ref 8.6–10.2)
Chloride: 104 mmol/L (ref 96–106)
Creatinine, Ser: 0.97 mg/dL (ref 0.76–1.27)
GFR calc Af Amer: 88 mL/min/{1.73_m2} (ref >59)
GFR calc non Af Amer: 76 mL/min/{1.73_m2} (ref >59)
Globulin, Total: 2.5 g/dL (ref 1.5–4.5)
Glucose: 185 mg/dL — ABNORMAL HIGH (ref 65–99)
Potassium: 4 mmol/L (ref 3.5–5.2)
Sodium: 140 mmol/L (ref 134–144)
Total Protein: 6.7 g/dL (ref 6.0–8.5)

## 2020-04-07 LAB — POCT GLYCOSYLATED HEMOGLOBIN (HGB A1C): Hemoglobin A1C: 6.5 % — AB (ref 4.0–5.6)

## 2020-04-07 MED ORDER — BLOOD GLUCOSE MONITOR KIT: PACK | 0 refills | Status: AC

## 2020-04-07 NOTE — Patient Instructions (Signed)
Nice to see you. Please monitor for any recurrent dizziness.  If it does recur please let us know right away. Please contact the [facility] to set up an appointment with them to arrange for medication for your diabetes. Please continue to monitor your blood pressure.  If it trends up from your values at home please let us know.

## 2020-04-07 NOTE — Assessment & Plan Note (Signed)
Suspect vertigo.  Has resolved.  Discussed monitoring and if it recurs to let us know immediately.

## 2020-04-07 NOTE — Assessment & Plan Note (Addendum)
A1c is well controlled.  No need for medication at this time.  We will recheck in 3 months.

## 2020-04-07 NOTE — Addendum Note (Signed)
Addended by: [name] Tobie Perdue G on: 04/07/2020 12:56 PM   Modules accepted: Orders

## 2020-04-07 NOTE — Progress Notes (Signed)
[name], MD Phone: [phone]  Jesus Peterson is a 74 y.o. male who presents today for follow-up.  Diabetes: Typically running 150-170.  Not on medication.  He notes he has to go to the [facility] for them to fill medication.  He is going to schedule an appointment for that.  No polyuria or polydipsia.  Hypertension: Typically around 626 systolically.  Taking amlodipine, carvedilol, HCTZ, and losartan.  No chest pain, shortness of breath, or edema.  Dizziness: This occurred yesterday right after he woke up.  He felt as though he was off balance for about 30 minutes and it resolved.  He has some intermittent ear fullness.  Some chronic tinnitus and chronic hearing loss.  He has hearing aids from the [facility] though does not wear them as they do not make a big difference.  He went to urgent care and was evaluated.  His glucose was found to be elevated.  Social History   Tobacco Use  Smoking Status Never Smoker  Smokeless Tobacco Never Used     ROS see history of present illness  Objective  Physical Exam Vitals:   04/07/20 1143 04/07/20 1201  BP: (!) 150/80 140/80  Pulse: 64   Temp: 97.6 F (36.4 C)   SpO2: 99%     BP Readings from Last 3 Encounters:  04/07/20 140/80  04/06/20 137/86  12/23/19 130/70   Wt Readings from Last 3 Encounters:  04/07/20 231 lb 12.8 oz (105.1 kg)  04/06/20 228 lb (103.4 kg)  12/23/19 233 lb 6.4 oz (105.9 kg)    Physical Exam Constitutional:      General: He is not in acute distress.    Appearance: He is not diaphoretic.  HENT:     Right Ear: Tympanic membrane and ear canal normal.     Left Ear: Tympanic membrane and ear canal normal.  Cardiovascular:     Rate and Rhythm: Normal rate and regular rhythm.     Heart sounds: Normal heart sounds.  Pulmonary:     Effort: Pulmonary effort is normal.     Breath sounds: Normal breath sounds.  Skin:    General: Skin is warm and dry.  Neurological:     Mental Status: He is alert.     Comments:  EOMI, PERRL, facial sensation intact bilaterally, hearing intact to finger rub, shoulder shrug intact, opens and closes eyes adequately, 5/5 strength in bilateral biceps, triceps, grip, quads, hamstrings, plantar and dorsiflexion, sensation to light touch intact in bilateral UE and LE, normal gait    Diabetic Foot Exam - Simple   Simple Foot Form Diabetic Foot exam was performed with the following findings: Yes 04/07/2020 11:59 AM  Visual Inspection No deformities, no ulcerations, no other skin breakdown bilaterally: Yes Sensation Testing Intact to touch and monofilament testing bilaterally: Yes Pulse Check Posterior Tibialis and Dorsalis pulse intact bilaterally: Yes Comments       Assessment/Plan: Please see individual problem list.  Hypertension Adequate control at home.  Continue current regimen.  We will continue to monitor his blood pressure.  Diabetes type 2, controlled A1c is well controlled.  No need for medication at this time.  We will recheck in 3 months.  Dizziness Suspect vertigo.  Has resolved.  Discussed monitoring and if it recurs to let us know immediately.   Orders Placed This Encounter  Procedures  . POCT HgB A1C    No orders of the defined types were placed in this encounter.   This visit occurred during the  SARS-CoV-2 public health emergency.  Safety protocols were in place, including screening questions prior to the visit, additional usage of staff PPE, and extensive cleaning of exam room while observing appropriate contact time as indicated for disinfecting solutions.    Carlina Derks, MD [facility] [place]  

## 2020-04-07 NOTE — Telephone Encounter (Signed)
Discussed lab results with patient via telephone.  Instructed him to follow up with his PCP as discussed.  He agrees to plan of care.

## 2020-04-07 NOTE — Assessment & Plan Note (Signed)
Adequate control at home.  Continue current regimen.  We will continue to monitor his blood pressure.

## 2020-04-14 ENCOUNTER — Other Ambulatory Visit: Payer: Self-pay | Admitting: Family Medicine

## 2020-04-14 DIAGNOSIS — E119 Type 2 diabetes mellitus without complications: Secondary | ICD-10-CM

## 2020-04-23 ENCOUNTER — Ambulatory Visit: Payer: Medicare Other | Admitting: Family Medicine

## 2020-05-08 DIAGNOSIS — G4733 Obstructive sleep apnea (adult) (pediatric): Secondary | ICD-10-CM | POA: Diagnosis not present

## 2020-05-08 DIAGNOSIS — R0602 Shortness of breath: Secondary | ICD-10-CM | POA: Diagnosis not present

## 2020-05-09 DIAGNOSIS — R0602 Shortness of breath: Secondary | ICD-10-CM | POA: Diagnosis not present

## 2020-05-09 DIAGNOSIS — G4733 Obstructive sleep apnea (adult) (pediatric): Secondary | ICD-10-CM | POA: Diagnosis not present

## 2020-05-27 ENCOUNTER — Other Ambulatory Visit: Payer: Self-pay | Admitting: Otolaryngology

## 2020-05-27 DIAGNOSIS — R42 Dizziness and giddiness: Secondary | ICD-10-CM | POA: Diagnosis not present

## 2020-05-27 DIAGNOSIS — H903 Sensorineural hearing loss, bilateral: Secondary | ICD-10-CM | POA: Diagnosis not present

## 2020-05-31 ENCOUNTER — Encounter: Payer: Self-pay | Admitting: Family Medicine

## 2020-06-03 ENCOUNTER — Ambulatory Visit
Admission: RE | Admit: 2020-06-03 | Discharge: 2020-06-03 | Disposition: A | Discharge status: Home / Self Care | Payer: Medicare Other | Source: Ambulatory Visit | Attending: Otolaryngology | Admitting: Otolaryngology

## 2020-06-03 ENCOUNTER — Telehealth: Payer: Self-pay | Admitting: Pharmacist

## 2020-06-03 ENCOUNTER — Ambulatory Visit (INDEPENDENT_AMBULATORY_CARE_PROVIDER_SITE_OTHER): Payer: Medicare Other | Admitting: Pharmacist

## 2020-06-03 DIAGNOSIS — I10 Essential (primary) hypertension: Secondary | ICD-10-CM | POA: Diagnosis not present

## 2020-06-03 DIAGNOSIS — I6523 Occlusion and stenosis of bilateral carotid arteries: Secondary | ICD-10-CM | POA: Diagnosis not present

## 2020-06-03 DIAGNOSIS — E119 Type 2 diabetes mellitus without complications: Secondary | ICD-10-CM

## 2020-06-03 DIAGNOSIS — R42 Dizziness and giddiness: Secondary | ICD-10-CM | POA: Diagnosis not present

## 2020-06-03 DIAGNOSIS — I771 Stricture of artery: Secondary | ICD-10-CM | POA: Diagnosis not present

## 2020-06-03 NOTE — Telephone Encounter (Addendum)
  Chronic Care Management   Note  06/03/2020 Name: KOKI BUXTON MRN: [id number] DOB: Jun 20, 1946   Attempted to contact patient for scheduled appointment for medication management support. Spoke with patient's wife, as patient was unavailable at the time. She took my direct number and noted that patient would return my call when he is available later today.  Called again in the afternoon, LVM for patient to return my call at his convenience.   Plan: - If I do not hear back from the patient by end of business today, will collaborate with Care Guide to outreach to schedule follow up with me  Catie Darnelle Maffucci, PharmD, [place], [place] Pharmacist [place] [phone]

## 2020-06-03 NOTE — Chronic Care Management (AMB) (Signed)
Chronic Care Management   Follow Up Note   06/03/2020 Name: Jesus Peterson MRN: [id number] DOB: 12-25-1945  Referred by: Jesus Haven, MD Reason for referral : Chronic Care Management (Medication Management)   Jesus Peterson is a 74 y.o. year old male who is a primary care patient of [name], [name], MD. The CCM team was consulted for assistance with chronic disease management and care coordination needs.    Contacted patient for medication management review.  Review of patient status, including review of consultants reports, relevant laboratory and other test results, and collaboration with appropriate care team members and the patient's provider was performed as part of comprehensive patient evaluation and provision of chronic care management services.    SDOH (Social Determinants of Health) assessments performed: Yes See Care Plan activities for detailed interventions related to SDOH)  SDOH Interventions     Most Recent Value  SDOH Interventions  Physical Activity Interventions Intervention Not Indicated       Outpatient Encounter Medications as of 06/03/2020  Medication Sig   amLODipine (NORVASC) 10 MG tablet TAKE 1 TABLET EVERY DAY   Bayer Microlet Lancets lancets USE  TWICE DAILY ICD 10 E11.9   blood glucose meter kit and supplies KIT Dispense based on patient and insurance preference. Check once daily fasting, Dx Code E11.9   Blood Pressure Monitoring (ADULT BLOOD PRESSURE CUFF LG) KIT Use daily to check blood pressure   carvedilol (COREG) 12.5 MG tablet TAKE 1 AND 1/2 TABLETS TWICE DAILY WITH MEALS (Patient taking differently: 09.9 mg. )   GARLIC OIL PO Take 1 capsule by mouth 4 (four) times daily.   glucose blood (BAYER CONTOUR NEXT TEST) test strip USE  STRIP TO CHECK GLUCOSE TWICE DAILY E11.9   hydrochlorothiazide (HYDRODIURIL) 12.5 MG tablet TAKE 1 TABLET EVERY DAY   losartan (COZAAR) 100 MG tablet TAKE 1 TABLET EVERY DAY   rosuvastatin (CRESTOR) 20  MG tablet TAKE 1 TABLET (20 MG TOTAL) BY MOUTH DAILY.   [DISCONTINUED] empagliflozin (JARDIANCE) 10 MG TABS tablet Take 10 mg by mouth daily before breakfast.   No facility-administered encounter medications on file as of 06/03/2020.     Objective:   Goals Addressed              This Visit's Progress     Patient Stated     "I want to work on my diabetes" (pt-stated)        [name] (see longtitudinal plan of care for additional care plan information)  Current Barriers:   Social, financial, and community barriers:  o Reports episodes of vertigo remain few and far between. Working w/ ENT for wokr up  Diabetes: CONTROLLED; complicated by chronic medical conditions including HTN, CKD, most recent A1c 6.5%  Most recent eGFR: ~88 mL/min  Current antihyperglycemic regimen: none  o Hx metformin XR; significant nausea preventing use  Current meal patterns: o Avoiding carbohydrates, sweets, breads  Current exercise o Riding his bike daily for ~30 minutes  Cardiovascular risk reduction: o Current hypertensive regimen: amlodipine 10 mg, carvedilol 12.5 mg BID; HCTZ 12.5 mg QAM, losartan 100 mg QAM; 128/73; HR 63; 140/74; HR 58 o Current hyperlipidemia regimen: rosuvastatin 20 mg daily; LDL at goal <70 o Current antiplatelet regimen: none  Vertigo: working w/ Dr. Pryor Ochoa, had cartoid artery scan. Recommended considering holding HCTZ  Pharmacist Clinical Goal(s):   Over the next 90 days, patient will work with PharmD and primary care provider to address optimized medication management  Interventions:  Comprehensive medication review performed, medication list updated in electronic medical record  Inter-disciplinary care team collaboration (see longitudinal plan of care)  Praised for attainment of goal A1c. Discussed long term micro and macrovascular complications of uncontrolled DM.   Reviewed goal A1c, goal fasting, goal 2 hour post prandial glucose  Reviewed  current antihypertensive regimen. Discussed appropriate BP checking technique. Discussed concepts of orthostatic hypotension. Patient will continue to monitor BP at home and bring readings to f/u with PCP and ENT.  Discussed goal LDL, discussed importance for primary prevention.   Patient Self Care Activities:   Patient will check blood glucose QD-BID, document, and provide at future appointments  Patient will take medications as prescribed  Patient will report any questions or concerns to provider   Please see past updates related to this goal by clicking on the "Past Updates" button in the selected goal          Plan:  - Scheduled f/u call in ~ 10 weeks  Catie Darnelle Maffucci, PharmD, [place], [place] Pharmacist [name] [phone]

## 2020-06-03 NOTE — Patient Instructions (Signed)
Visit Information  Goals Addressed              This Visit's Progress     Patient Stated   .  "I want to work on my diabetes" (pt-stated)        CARE PLAN ENTRY (see longtitudinal plan of care for additional care plan information)  Current Barriers:  . Social, financial, and community barriers:  o Reports episodes of vertigo remain few and far between. Working w/ ENT for wokr up . Diabetes: CONTROLLED; complicated by chronic medical conditions including HTN, CKD, most recent A1c 6.5% . Most recent eGFR: ~88 mL/min . Current antihyperglycemic regimen: none  o Hx metformin XR; significant nausea preventing use . Current meal patterns: o Avoiding carbohydrates, sweets, breads . Current exercise o Riding his bike daily for ~30 minutes . Cardiovascular risk reduction: o Current hypertensive regimen: amlodipine 10 mg, carvedilol 12.5 mg BID; HCTZ 12.5 mg QAM, losartan 100 mg QAM; 128/73; HR 63; 140/74; HR 58 o Current hyperlipidemia regimen: rosuvastatin 20 mg daily; LDL at goal <70 o Current antiplatelet regimen: none . Vertigo: working w/ Dr. Pryor Ochoa, had cartoid artery scan. Recommended considering holding HCTZ  Pharmacist Clinical Goal(s):  Marland Kitchen Over the next 90 days, patient will work with PharmD and primary care provider to address optimized medication management  Interventions: . Comprehensive medication review performed, medication list updated in electronic medical record . Inter-disciplinary care team collaboration (see longitudinal plan of care) . Praised for attainment of goal A1c. Discussed long term micro and macrovascular complications of uncontrolled DM.  Marland Kitchen Reviewed goal A1c, goal fasting, goal 2 hour post prandial glucose . Reviewed current antihypertensive regimen. Discussed appropriate BP checking technique. Discussed concepts of orthostatic hypotension. Patient will continue to monitor BP at home and bring readings to f/u with PCP and ENT. Marland Kitchen Discussed goal LDL,  discussed importance for primary prevention.   Patient Self Care Activities:  . Patient will check blood glucose QD-BID, document, and provide at future appointments . Patient will take medications as prescribed . Patient will report any questions or concerns to provider   Please see past updates related to this goal by clicking on the "Past Updates" button in the selected goal         The patient verbalized understanding of instructions provided today and declined a print copy of patient instruction materials.   Plan:  - Scheduled f/u call in ~ 10 weeks  Catie Darnelle Maffucci, PharmD, [place], [place] Pharmacist [place] [phone]

## 2020-06-03 NOTE — Telephone Encounter (Signed)
Patient returned my call, see CCM documentation

## 2020-06-07 ENCOUNTER — Encounter: Payer: Self-pay | Admitting: Dermatology

## 2020-06-07 ENCOUNTER — Ambulatory Visit (INDEPENDENT_AMBULATORY_CARE_PROVIDER_SITE_OTHER): Payer: Medicare Other | Admitting: Dermatology

## 2020-06-07 ENCOUNTER — Other Ambulatory Visit: Payer: Self-pay

## 2020-06-07 DIAGNOSIS — L821 Other seborrheic keratosis: Secondary | ICD-10-CM

## 2020-06-07 DIAGNOSIS — L739 Follicular disorder, unspecified: Secondary | ICD-10-CM

## 2020-06-07 DIAGNOSIS — L820 Inflamed seborrheic keratosis: Secondary | ICD-10-CM

## 2020-06-07 DIAGNOSIS — Z85820 Personal history of malignant melanoma of skin: Secondary | ICD-10-CM

## 2020-06-07 DIAGNOSIS — Z86006 Personal history of melanoma in-situ: Secondary | ICD-10-CM

## 2020-06-07 DIAGNOSIS — D692 Other nonthrombocytopenic purpura: Secondary | ICD-10-CM | POA: Diagnosis not present

## 2020-06-07 DIAGNOSIS — Z85828 Personal history of other malignant neoplasm of skin: Secondary | ICD-10-CM

## 2020-06-07 DIAGNOSIS — Z86018 Personal history of other benign neoplasm: Secondary | ICD-10-CM | POA: Diagnosis not present

## 2020-06-07 DIAGNOSIS — L219 Seborrheic dermatitis, unspecified: Secondary | ICD-10-CM | POA: Diagnosis not present

## 2020-06-07 DIAGNOSIS — D1800 Hemangioma unspecified site: Secondary | ICD-10-CM

## 2020-06-07 DIAGNOSIS — D229 Melanocytic nevi, unspecified: Secondary | ICD-10-CM

## 2020-06-07 DIAGNOSIS — L304 Erythema intertrigo: Secondary | ICD-10-CM | POA: Diagnosis not present

## 2020-06-07 DIAGNOSIS — L578 Other skin changes due to chronic exposure to nonionizing radiation: Secondary | ICD-10-CM

## 2020-06-07 MED ORDER — HYDROCORTISONE 2.5 % EX CREA: TOPICAL_CREAM | CUTANEOUS | 2 refills | Status: DC

## 2020-06-07 MED ORDER — KETOCONAZOLE 2 % EX CREA: 1.0000 "application " | TOPICAL_CREAM | CUTANEOUS | 3 refills | Status: AC

## 2020-06-07 NOTE — Patient Instructions (Addendum)
Panoxyl Creamy wash for back, let sit several minutes and rinse off in shower  Seborrheic Dermatitis  Mix hydrocortisone with ketaconazole 2% twice a day. If improved, decrease to hydrocortisone and ketaconazole mixed once a day. If still clear, decrease to ketaconazole only.

## 2020-06-07 NOTE — Progress Notes (Addendum)
New Patient Visit  Subjective  Jesus Peterson is a 74 y.o. male who presents for the following: Annual Exam (total body skin exam hx of melanoma L face ~2019 Dr. Antonietta Jewel, [facility] R hand txted by Dr. Phillip Heal, hx of dysplastic nevus R mid back). Spot on R neck gets irritated by shaving.  He also uses ketoconazole cream and HC cream for flaky rash on the face.   The following portions of the chart were reviewed this encounter and updated as appropriate:      Review of Systems:  No other skin or systemic complaints except as noted in HPI or Assessment and Plan.  Objective  Well appearing patient in no apparent distress; mood and affect are within normal limits.  All skin waist up examined.  Objective  Right mid back: Scar with no evidence of recurrence.   Objective  Right Hand - Posterior: Well healed scar with no evidence of recurrence  Objective  R neck x 1: Erythematous keratotic or waxy stuck-on papule  Objective  face: Pink scaliness glabella, eyebrows, temples  Objective  bil axilla: Bright red erythema bil axilla  Objective  R and L lateral upper back: Pink inflammatory papules  Objective  Left cheek: Well healed scar with no evidence of recurrence   Assessment & Plan    Actinic Damage - diffuse scaly erythematous macules with underlying dyspigmentation - Recommend daily broad spectrum sunscreen SPF 30+ to sun-exposed areas, reapply every 2 hours as needed.  - Call for new or changing lesions.  Melanocytic Nevi - Tan-brown and/or pink-flesh-colored symmetric macules and papules - Benign appearing on exam today - Observation - Call clinic for new or changing moles - Recommend daily use of broad spectrum spf 30+ sunscreen to sun-exposed areas.   Hemangiomas - Red papules - Discussed benign nature - Observe - Call for any changes  Seborrheic Keratoses - Stuck-on, waxy, tan-brown papules and plaques  - Discussed benign etiology and  prognosis. - Observe - Call for any changes  Purpura - Violaceous macules and patches - Benign - Related to age, sun damage and/or use of blood thinners - Observe - Can use OTC arnica containing moisturizer such as Dermend Bruise Formula if desired - Call for worsening or other concerns  Skin cancer screening performed today.   History of dysplastic nevus Right mid back  Clear, observe for changes   History of SCC (squamous cell carcinoma) of skin Right Hand - Posterior  Clear. Observe for recurrence. Call clinic for new or changing lesions.  Recommend regular skin exams, daily broad-spectrum spf 30+ sunscreen use, and photoprotection.      Inflamed seborrheic keratosis R neck x 1  Destruction of lesion - R neck x 1  Destruction method: cryotherapy   Informed consent: discussed and consent obtained   Lesion destroyed using liquid nitrogen: Yes   Region frozen until ice ball extended beyond lesion: Yes   Outcome: patient tolerated procedure well with no complications   Post-procedure details: wound care instructions given    Seborrheic dermatitis face  Cont Ketoconazole 2% cr qd to aa face Cont HC 2.5% cr qd up to 4d/wk prn flares aa face, pt may mix with ketoconazole cream  Topical steroids (such as triamcinolone, fluocinolone, fluocinonide, mometasone, clobetasol, halobetasol, betamethasone, hydrocortisone) can cause thinning and lightening of the skin if they are used for too long in the same area. Your physician has selected the right strength medicine for your problem and area affected on the body. Please  use your medication only as directed by your physician to prevent side effects.    ketoconazole (NIZORAL) 2 % cream - face  hydrocortisone 2.5 % cream - face  Intertrigo bil axilla  Start ketoconazole 2% cr qd to bil axilla May start HC 2.5% cr mixed with Ketoconazole 2% qd prn flares  Start Zeasorb AF powder qd  Folliculitis R and L lateral upper  back  Start Panoxyl 4% creamy wash qd to back Recheck on f/up  Hx of melanoma in situ Left cheek  Clear s/p Mohs with Dr. Lacinda Axon. Observe for recurrence. Call clinic for new or changing lesions.  Recommend regular skin exams, daily broad-spectrum spf 30+ sunscreen use, and photoprotection.  Request Pathology report of melanoma  Return in about 6 months (around 12/08/2020) for UBSE, hx of Melanoma, SCC, Dysplastic.   I, Jesus Peterson, RMA, am acting as scribe for Jesus Patty, MD . Documentation: I have reviewed the above documentation for accuracy and completeness, and I agree with the above.  Jesus Patty MD

## 2020-06-11 ENCOUNTER — Other Ambulatory Visit: Payer: Self-pay | Admitting: Family Medicine

## 2020-06-16 ENCOUNTER — Encounter: Payer: Self-pay | Admitting: Family Medicine

## 2020-06-18 ENCOUNTER — Emergency Department: Payer: No Typology Code available for payment source

## 2020-06-18 ENCOUNTER — Other Ambulatory Visit: Payer: Self-pay

## 2020-06-18 ENCOUNTER — Encounter: Payer: Self-pay | Admitting: Emergency Medicine

## 2020-06-18 DIAGNOSIS — C439 Malignant melanoma of skin, unspecified: Secondary | ICD-10-CM | POA: Diagnosis not present

## 2020-06-18 DIAGNOSIS — R079 Chest pain, unspecified: Secondary | ICD-10-CM | POA: Insufficient documentation

## 2020-06-18 DIAGNOSIS — E1122 Type 2 diabetes mellitus with diabetic chronic kidney disease: Secondary | ICD-10-CM | POA: Insufficient documentation

## 2020-06-18 DIAGNOSIS — I129 Hypertensive chronic kidney disease with stage 1 through stage 4 chronic kidney disease, or unspecified chronic kidney disease: Secondary | ICD-10-CM | POA: Diagnosis not present

## 2020-06-18 DIAGNOSIS — C4339 Malignant melanoma of other parts of face: Secondary | ICD-10-CM | POA: Diagnosis not present

## 2020-06-18 DIAGNOSIS — N189 Chronic kidney disease, unspecified: Secondary | ICD-10-CM | POA: Diagnosis not present

## 2020-06-18 LAB — CBC
HCT: 45.7 % (ref 39.0–52.0)
Hemoglobin: 15.5 g/dL (ref 13.0–17.0)
MCH: 29.1 pg (ref 26.0–34.0)
MCHC: 33.9 g/dL (ref 30.0–36.0)
MCV: 85.9 fL (ref 80.0–100.0)
Platelets: 148 10*3/uL — ABNORMAL LOW (ref 150–400)
RBC: 5.32 MIL/uL (ref 4.22–5.81)
RDW: 13.5 % (ref 11.5–15.5)
WBC: 6.5 10*3/uL (ref 4.0–10.5)
nRBC: 0 % (ref 0.0–0.2)

## 2020-06-18 LAB — BASIC METABOLIC PANEL
Anion gap: 11 (ref 5–15)
BUN: 25 mg/dL — ABNORMAL HIGH (ref 8–23)
CO2: 24 mmol/L (ref 22–32)
Calcium: 9 mg/dL (ref 8.9–10.3)
Chloride: 105 mmol/L (ref 98–111)
Creatinine, Ser: 1.02 mg/dL (ref 0.61–1.24)
GFR calc Af Amer: >60 mL/min (ref >60)
GFR calc non Af Amer: >60 mL/min (ref >60)
Glucose, Bld: 142 mg/dL — ABNORMAL HIGH (ref 70–99)
Potassium: 4 mmol/L (ref 3.5–5.1)
Sodium: 140 mmol/L (ref 135–145)

## 2020-06-18 LAB — TROPONIN I (HIGH SENSITIVITY): Troponin I (High Sensitivity): 4 ng/L (ref <18)

## 2020-06-18 NOTE — ED Triage Notes (Signed)
Pt reports sharp chest pain since about 5pm this afternoon, reports pain is intermittent, denies any other symptom, pt talks in complete sentences no respiratory distress noted .

## 2020-06-19 ENCOUNTER — Emergency Department
Admission: EM | Admit: 2020-06-19 | Discharge: 2020-06-19 | Disposition: A | Discharge status: Home / Self Care | Payer: No Typology Code available for payment source | Attending: Emergency Medicine | Admitting: Emergency Medicine

## 2020-06-19 DIAGNOSIS — R079 Chest pain, unspecified: Secondary | ICD-10-CM

## 2020-06-19 LAB — TROPONIN I (HIGH SENSITIVITY): Troponin I (High Sensitivity): 5 ng/L (ref <18)

## 2020-06-19 NOTE — ED Provider Notes (Signed)
[facility] Emergency Department Provider Note  ____________________________________________   First MD Initiated Contact with Patient 06/19/20 [phone]     (approximate)  I have reviewed the triage vital signs and the nursing notes.   HISTORY  Chief Complaint Chest Pain   HPI GRAYER Jesus Peterson is a 74 y.o. male with a past medical history of HTN, HDL, local melanoma status post excision, and recurrent vertigo currently being worked up by ENT who presents accompanied by his wife for assessment of some chest tightness/pressure that he experienced yesterday afternoon while sitting.  No clear alleviating aggravating factors including exertion or positional factors.  No prior similar episodes.  No associated headache, earache, sore throat, vision changes, cough, shortness of breath, nausea, vomiting, diarrhea, dysuria, back pain, dental pain, or other acute symptoms.  No recent traumatic injuries.  Patient denies tobacco use, EtOH use, illicit drug use.  He does endorse a strong family history of CAD.          Past Medical History:  Diagnosis Date  . Actinic keratosis 08/18/2010   Left forearm. Hypertrophic.  . Cancer ([place])    MELANOMA OF SKIN  . Chronic kidney disease   . Dysplastic nevus 07/06/2010   Right back. Moderate atypia.   . Erectile dysfunction   . History of kidney stones   . Hyperlipidemia   . Hypertension   . Melanoma ([place]) 10/17/2017   L infraorbitial cheek, Melanoma IS txted with [facility] Dr. Lacinda Axon, pathology in media  . Seborrheic dermatitis   . Squamous cell carcinoma of skin ?   R hand, txted by Dr. Phillip Heal  . Treadmill stress test negative for angina pectoris 2013   Dr. Nehemiah Massed, normal per pt  . Valvular heart disease   . Venous angioma of brain ([place])    Pons, found 2/2 tinnitus, followed on MRI    Patient Active Problem List   Diagnosis Date Noted  . Dizziness 04/07/2020  . Chronic kidney disease 10/08/2019  . Eczema 09/23/2019  .  Chronic cough 07/23/2019  . Skin infection 07/23/2019  . Seborrheic dermatitis 07/22/2018  . Prostate cancer screening 04/08/2018  . Melanoma in situ of cheek ([place]) 12/24/2017  . History of colon polyps 12/24/2017  . History of malignant melanoma of skin 12/11/2017  . Right upper quadrant abdominal pain 09/21/2017  . Toenail deformity 06/26/2017  . Hearing difficulty of both ears 03/26/2017  . Left foot pain 02/05/2017  . Diarrhea 02/05/2017  . Neck pain 10/18/2016  . Osteoarthritis of left hand 06/30/2016  . Angioma 06/30/2016  . Obstructive sleep apnea 07/03/2014  . Valvular heart disease 05/11/2014  . Nonspecific abnormal electrocardiogram (ECG) (EKG) 02/06/2014  . Diabetes type 2, controlled ([place]) 08/11/2013  . GERD (gastroesophageal reflux disease) 04/05/2012  . Hypertension 03/04/2012  . Erectile dysfunction 03/04/2012  . Hyperlipidemia 03/04/2012    Past Surgical History:  Procedure Laterality Date  . COLONOSCOPY  08/24/2006,08/23/2012  . COLONOSCOPY WITH PROPOFOL N/A 09/09/2018   Procedure: COLONOSCOPY WITH PROPOFOL;  Surgeon: Lollie Sails, MD;  Location: [facility] ENDOSCOPY;  Service: Endoscopy;  Laterality: N/A;    Prior to Admission medications   Medication Sig Start Date End Date Taking? Authorizing Provider  amLODipine (NORVASC) 10 MG tablet TAKE 1 TABLET EVERY DAY 09/26/19   [name], MD  Bayer Microlet Lancets lancets USE  TWICE DAILY ICD 10 E11.9 05/28/19   [name], MD  blood glucose meter kit and supplies KIT Dispense based on patient and insurance preference.  Check once daily fasting, Dx Code E11.9 04/07/20   [name], MD  Blood Pressure Monitoring (ADULT BLOOD PRESSURE CUFF LG) KIT Use daily to check blood pressure 11/11/17   [name], MD  carvedilol (COREG) 12.5 MG tablet TAKE 1 AND 1/2 TABLETS TWICE DAILY WITH MEALS Patient taking differently: 12.5 mg.  04/15/20   [name], MD  GARLIC OIL PO Take 1 capsule by  mouth 4 (four) times daily.    [provider]  glucose blood (BAYER CONTOUR NEXT TEST) test strip USE  STRIP TO CHECK GLUCOSE TWICE DAILY E11.9 10/30/19   [name], MD  hydrochlorothiazide (HYDRODIURIL) 12.5 MG tablet TAKE 1 TABLET EVERY DAY 03/08/20   [name], MD  hydrocortisone 2.5 % cream Apply topically as directed. Qd up to 4 days a week to aa face and under arms prn flares 06/07/20 09/05/20  [name], MD  ketoconazole (NIZORAL) 2 % cream Apply 1 application topically as directed. Qd to aa rash on face and under arms prn flares 06/07/20 09/05/20  [name], MD  losartan (COZAAR) 100 MG tablet TAKE 1 TABLET EVERY DAY 06/11/20   [name], MD  rosuvastatin (CRESTOR) 20 MG tablet TAKE 1 TABLET (20 MG TOTAL) BY MOUTH DAILY. 04/01/20   [name], MD    Allergies Contrast media [iodinated diagnostic agents] and Penicillins  Family History  Problem Relation Age of Onset  . Stroke Mother   . Heart disease Mother        s/p CABG  . Heart disease Father   . Heart disease Brother        s/p CABG  . Heart disease Brother        s/p angioplasty  . Arthritis Brother     Social History Social History   Tobacco Use  . Smoking status: Never Smoker  . Smokeless tobacco: Never Used  Vaping Use  . Vaping Use: Never used  Substance Use Topics  . Alcohol use: No  . Drug use: Never    Review of Systems  Review of Systems  Constitutional: Negative for chills and fever.  HENT: Negative for sore throat.   Eyes: Negative for pain.  Respiratory: Negative for cough and stridor.   Cardiovascular: Positive for chest pain.  Gastrointestinal: Negative for vomiting.  Skin: Negative for rash.  Neurological: Negative for seizures, loss of consciousness and headaches.  Psychiatric/Behavioral: Negative for suicidal ideas.  All other systems reviewed and are negative.     ____________________________________________   PHYSICAL EXAM:  VITAL  SIGNS: ED Triage Vitals  Enc Vitals Group     BP 06/18/20 2132 (!) 152/76     Pulse Rate 06/18/20 2132 71     Resp 06/18/20 2132 16     Temp 06/18/20 2132 98.6 F (37 C)     Temp Source 06/18/20 2132 Oral     SpO2 06/18/20 2132 99 %     Weight 06/18/20 2134 223 lb (101.2 kg)     Height 06/18/20 2134 '5\' 11"'  (1.803 m)     Head Circumference --      Peak Flow --      Pain Score 06/18/20 2134 0     Pain Loc --      Pain Edu? --      Excl. in [place]? --    Vitals:   06/19/20 0452 06/19/20 0938  BP: (!) 159/89 (!) 163/96  Pulse: 68 77  Resp: 17 12  Temp:  97.8 F (36.6 C)   SpO2: 98% 98%   Physical Exam Vitals and nursing note reviewed.  Constitutional:      Appearance: He is well-developed.  HENT:     Head: Normocephalic and atraumatic.     Right Ear: External ear normal.     Left Ear: External ear normal.     Nose: Nose normal.     Mouth/Throat:     Mouth: Mucous membranes are moist.  Eyes:     Conjunctiva/sclera: Conjunctivae normal.  Cardiovascular:     Rate and Rhythm: Normal rate and regular rhythm.     Heart sounds: No murmur heard.   Pulmonary:     Effort: Pulmonary effort is normal. No respiratory distress.     Breath sounds: Normal breath sounds.  Abdominal:     Palpations: Abdomen is soft.     Tenderness: There is no abdominal tenderness.  Musculoskeletal:     Cervical back: Neck supple.     Right lower leg: No edema.     Left lower leg: No edema.  Skin:    General: Skin is warm and dry.     Capillary Refill: Capillary refill takes less than 2 seconds.  Neurological:     Mental Status: He is alert and oriented to person, place, and time.  Psychiatric:        Mood and Affect: Mood normal.      ____________________________________________   LABS (all labs ordered are listed, but only abnormal results are displayed)  Labs Reviewed  BASIC METABOLIC PANEL - Abnormal; Notable for the following components:      Result Value   Glucose, Bld 142 (*)     BUN 25 (*)    All other components within normal limits  CBC - Abnormal; Notable for the following components:   Platelets 148 (*)    All other components within normal limits  TROPONIN I (HIGH SENSITIVITY)  TROPONIN I (HIGH SENSITIVITY)   ____________________________________________  EKG  Sinus rhythm with a ventricular rate of 71, normal axis, unremarkable intervals, nonspecific ST T wave changes in inferior leads that are largely unchanged when compared to prior.  No other clear evidence of acute ischemia or other significant underlying arrhythmia. ____________________________________________  RADIOLOGY  ED MD interpretation: Unremarkable.  Official radiology report(s): DG Chest 2 View  Result Date: 06/18/2020 CLINICAL DATA:  Chest pain EXAM: CHEST - 2 VIEW COMPARISON:  07/25/2019 FINDINGS: Lungs are symmetrically expanded and clear. No pneumothorax or pleural effusion. Cardiac size within normal limits. Pulmonary vascularity is normal. Osseous structures are age-appropriate. No acute bone abnormality. IMPRESSION: No active cardiopulmonary disease. Electronically Signed   By: Fidela Salisbury MD   On: 06/18/2020 22:16    ____________________________________________   PROCEDURES  Procedure(s) performed (including Critical Care):  Procedures   ____________________________________________   INITIAL IMPRESSION / ASSESSMENT AND PLAN / ED COURSE        Overall patient's history, exam, and ED work-up is consistent with possible angina versus GERD.  Patient is slightly hypertensive otherwise stable vital signs on arrival.  Low suspicion for ACS given patient denies any shortness of breath and is not tachypneic or hypoxic and has no other significant risk factors.  Chest x-ray showed no evidence of pneumonia, pneumothorax, effusion, or other acute intrathoracic process.  Patient is not anemic and there are no significant electrolyte metabolic derangements identified.  While patient  is HEAR score is elevated at 5 his ECG is unchanged when compared to prior and given 2 nonelevated  troponins obtained over 2 hours low suspicion for cardiac ischemia at this time.  I did extensively discussed with the patient that his risk factors put him at high risk for CAD and that was reassured that he was not currently experiencing cardiac ischemia he was at high risk for heart attack and must immediately discuss with his PCP undergoing outpatient stress testing as well as possible additional work-up to lower his CAD risk factors.  Encourage patient to take his home BP meds.  Patient and wife in agreement with this plan.  Patient discharged stable condition.  Return precautions advised and discussed  ____________________________________________   FINAL CLINICAL IMPRESSION(S) / ED DIAGNOSES  Final diagnoses:  Chest pain, unspecified type    Medications - No data to display   ED Discharge Orders    None       Note:  This document was prepared using Dragon voice recognition software and may include unintentional dictation errors.   Lucrezia Starch, MD 06/19/20 [phone]

## 2020-06-19 NOTE — ED Notes (Signed)
This RN and Val RN to bedside at this time. Introduced self to pt. Pt arrived at ED with intermittent centralized chest pain. Pt denies any pain at this time. Pt denies any shortness of breath, cough, swelling of extremities. Stretcher in lowest, locked position with call light in reach. Pt denies further needs at this time.

## 2020-06-24 ENCOUNTER — Encounter: Payer: Self-pay | Admitting: Cardiology

## 2020-06-24 ENCOUNTER — Ambulatory Visit (INDEPENDENT_AMBULATORY_CARE_PROVIDER_SITE_OTHER): Payer: Medicare Other | Admitting: Cardiology

## 2020-06-24 ENCOUNTER — Other Ambulatory Visit: Payer: Self-pay

## 2020-06-24 VITALS — BP 134/70 | HR 70 | Ht 71.0 in | Wt 224.0 lb

## 2020-06-24 DIAGNOSIS — E7800 Pure hypercholesterolemia, unspecified: Secondary | ICD-10-CM

## 2020-06-24 DIAGNOSIS — I10 Essential (primary) hypertension: Secondary | ICD-10-CM

## 2020-06-24 DIAGNOSIS — R079 Chest pain, unspecified: Secondary | ICD-10-CM

## 2020-06-24 NOTE — Patient Instructions (Addendum)
Medication Instructions:   Prilosec (Over the Counter) Take 1 tablet by mouth daily.  *If you need a refill on your cardiac medications before your next appointment, please call your pharmacy*   Lab Work:  Drive through our [place] testing the day prior to your stress test, located at the front of the medical mall entrance. Hours are 8am-1pm.  If you have labs (blood work) drawn today and your tests are completely normal, you will receive your results only by: Marland Kitchen MyChart Message (if you have MyChart) OR . A paper copy in the mail If you have any lab test that is abnormal or we need to change your treatment, we will call you to review the results.   Testing/Procedures:  1.  Your physician has requested that you have an echocardiogram. Echocardiography is a painless test that uses sound waves to create images of your heart. It provides your doctor with information about the size and shape of your heart and how well your heart's chambers and valves are working. This procedure takes approximately one hour. There are no restrictions for this procedure.  2.  [place] WITH TREADMILL  Your caregiver has ordered a Stress Test with nuclear imaging. The purpose of this test is to evaluate the blood supply to your heart muscle. This procedure is referred to as a "Non-Invasive Stress Test." This is because other than having an IV started in your vein, nothing is inserted or "invades" your body. Cardiac stress tests are done to find areas of poor blood flow to the heart by determining the extent of coronary artery disease (CAD). Some patients exercise on a treadmill, which naturally increases the blood flow to your heart, while others who are  unable to walk on a treadmill due to physical limitations have a pharmacologic/chemical stress agent called Lexiscan . This medicine will mimic walking on a treadmill by temporarily increasing your coronary blood flow.       PLEASE REPORT TO [facility] MEDICAL MALL  ENTRANCE  THE VOLUNTEERS AT THE FIRST DESK WILL DIRECT YOU WHERE TO GO  **Please note: these test may take anywhere between 2-4 hours to complete   Date of Procedure:_____________________________________  Arrival Time for Procedure:______________________________  Instructions regarding medication:   ____ : Hold diabetes medication morning of procedure  _XX___:  Hold betablocker(s) night before procedure and morning of procedure: carvedilol (COREG)   PLEASE NOTIFY THE OFFICE AT LEAST 24 HOURS IN ADVANCE IF YOU ARE UNABLE TO KEEP YOUR APPOINTMENT.  [phone] AND  PLEASE NOTIFY NUCLEAR MEDICINE AT [facility] AT LEAST 24 HOURS IN ADVANCE IF YOU ARE UNABLE TO KEEP YOUR APPOINTMENT. [phone]  How to prepare for your Myoview test:  1. Do not eat or drink after midnight 2. No caffeine for 24 hours prior to test 3. No smoking 24 hours prior to test. 4. Your medication may be taken with water.  If your doctor stopped a medication because of this test, do not take that medication. 5. No perfume, cologne or lotion.       Follow-Up: At [facility], you and your health needs are our priority.  As part of our continuing mission to provide you with exceptional heart care, we have created designated Provider Care Teams.  These Care Teams include your primary Cardiologist (physician) and Advanced Practice Providers (APPs -  Physician Assistants and Nurse Practitioners) who all work together to provide you with the care you need, when you need it.  We recommend signing up for the patient portal  called "MyChart".  Sign up information is provided on this After Visit Summary.  MyChart is used to connect with patients for Virtual Visits (Telemedicine).  Patients are able to view lab/test results, encounter notes, upcoming appointments, etc.  Non-urgent messages can be sent to your provider as well.   To learn more about what you can do with MyChart, go to [email or web address].    Your next  appointment:   Follow up after Echo   The format for your next appointment:   In Person  Provider:   Kate Sable, MD   Other Instructions

## 2020-06-24 NOTE — Progress Notes (Signed)
Cardiology Office Note:    Date:  06/24/2020   ID:  Jesus Peterson, DOB 14-Jan-1946, MRN [id number]  PCP:  [name], MD  [facility] Cardiologist:  Kate Sable, MD  [place] Electrophysiologist:  None   Referring MD: [name], MD   Chief Complaint  Patient presents with  . new pt    History of Present Illness:    Jesus Peterson is a 74 y.o. male with a hx of hypertension, hyperlipidemia who presents due to chest pain.  Patient was sitting at home about 5 days ago when symptoms began.  He describes chest pressure which he rates as 5 out of 10 in severity lasting couple of seconds in 30-minute intervals.  He denies any prior episodes.  Due to persistent symptoms, he was brought to the ED by family.  Denies any chest pain with exertion.  Able to walk to the office today without chest pain or shortness of breath.  Patient rides a bike outside 5 days a week for about an hour without any symptoms of chest pain or shortness of breath.  In the ED,.  EKG did not reveal any evidence for ischemia, troponins were normal.  Chest x-ray was also normal.  Due to risk factors, outpatient stress testing was recommended.  Patient states having occasional nonspecific chest discomfort since the ED incident which he attributes to reflux.  Burping causes symptoms to improve.  He has a strong family history of CAD with his dad having MI in his 31s.  2 brothers have coronary artery disease both in their 15s.  A sister who was asymptomatic recently had stents placed in her 60s.  Past Medical History:  Diagnosis Date  . Actinic keratosis 08/18/2010   Left forearm. Hypertrophic.  . Cancer ([place])    MELANOMA OF SKIN  . Chronic kidney disease   . Dysplastic nevus 07/06/2010   Right back. Moderate atypia.   . Erectile dysfunction   . History of kidney stones   . Hyperlipidemia   . Hypertension   . Melanoma ([place]) 10/17/2017   L infraorbitial cheek, Melanoma IS txted with [facility]  Dr. Lacinda Axon, pathology in media  . Seborrheic dermatitis   . Squamous cell carcinoma of skin ?   R hand, txted by Dr. Phillip Heal  . Treadmill stress test negative for angina pectoris 2013   Dr. Nehemiah Massed, normal per pt  . Valvular heart disease   . Venous angioma of brain (HCC)    Pons, found 2/2 tinnitus, followed on MRI    Past Surgical History:  Procedure Laterality Date  . COLONOSCOPY  08/24/2006,08/23/2012  . COLONOSCOPY WITH PROPOFOL N/A 09/09/2018   Procedure: COLONOSCOPY WITH PROPOFOL;  Surgeon: Lollie Sails, MD;  Location[facility];  Service: Endoscopy;  Laterality: N/A;    Current Medications: Current Meds  Medication Sig  . amLODipine (NORVASC) 10 MG tablet TAKE 1 TABLET EVERY DAY  . Bayer Microlet Lancets lancets USE  TWICE DAILY ICD 10 E11.9  . blood glucose meter kit and supplies KIT Dispense based on patient and insurance preference. Check once daily fasting, Dx Code E11.9  . Blood Pressure Monitoring (ADULT BLOOD PRESSURE CUFF LG) KIT Use daily to check blood pressure  . carvedilol (COREG) 12.5 MG tablet Take 12.5 mg by mouth 2 (two) times daily with a meal.  . clindamycin (CLEOCIN) 300 MG capsule Take 300 mg by mouth as needed.  Marland Kitchen GARLIC OIL PO Take 1 capsule by mouth 4 (four) times daily.  Marland Kitchen  glucose blood (BAYER CONTOUR NEXT TEST) test strip USE  STRIP TO CHECK GLUCOSE TWICE DAILY E11.9  . hydrochlorothiazide (HYDRODIURIL) 12.5 MG tablet TAKE 1 TABLET EVERY DAY  . hydrocortisone 2.5 % cream Apply topically as directed. Qd up to 4 days a week to aa face and under arms prn flares  . ketoconazole (NIZORAL) 2 % cream Apply 1 application topically as directed. Qd to aa rash on face and under arms prn flares  . losartan (COZAAR) 100 MG tablet TAKE 1 TABLET EVERY DAY  . rosuvastatin (CRESTOR) 20 MG tablet TAKE 1 TABLET (20 MG TOTAL) BY MOUTH DAILY.     Allergies:   Contrast media [iodinated diagnostic agents] and Penicillins   Social History   Socioeconomic  History  . Marital status: Married    Spouse name: Not on file  . Number of children: Not on file  . Years of education: Not on file  . Highest education level: Not on file  Occupational History  . Not on file  Tobacco Use  . Smoking status: Never Smoker  . Smokeless tobacco: Never Used  Vaping Use  . Vaping Use: Never used  Substance and Sexual Activity  . Alcohol use: No  . Drug use: Never  . Sexual activity: Not Currently  Other Topics Concern  . Not on file  Social History Narrative   Lives in [place] with wife, 44years. Children - son 12 and daughter 55.   Work - Scientist, research (physical sciences)   Diet - regular   Exercise - limited, walks occas   Social Determinants of Radio broadcast assistant Strain:   . Difficulty of Paying Living Expenses: Not on file  Food Insecurity:   . Worried About Charity fundraiser in the Last Year: Not on file  . Ran Out of Food in the Last Year: Not on file  Transportation Needs:   . Lack of Transportation (Medical): Not on file  . Lack of Transportation (Non-Medical): Not on file  Physical Activity: Insufficiently Active  . Days of Exercise per Week: 7 days  . Minutes of Exercise per Session: 20 min  Stress:   . Feeling of Stress : Not on file  Social Connections:   . Frequency of Communication with Friends and Family: Not on file  . Frequency of Social Gatherings with Friends and Family: Not on file  . Attends Religious Services: Not on file  . Active Member of Clubs or Organizations: Not on file  . Attends Archivist Meetings: Not on file  . Marital Status: Not on file     Family History: The patient's family history includes Arthritis in his brother; Heart disease in his brother, brother, father, and mother; Stroke in his mother.  ROS:   Please see the history of present illness.     All other systems reviewed and are negative.  EKGs/Labs/Other Studies Reviewed:    The following studies were reviewed today:   EKG:  EKG is  ordered  today.  The ekg ordered today demonstrates sinus rhythm, first-degree AV block.  Recent Labs: 04/06/2020: ALT 21 06/18/2020: BUN 25; Creatinine, Ser 1.02; Hemoglobin 15.5; Platelets 148; Potassium 4.0; Sodium 140  Recent Lipid Panel    Component Value Date/Time   CHOL 135 11/06/2019 0932   TRIG 222.0 (H) 11/06/2019 0932   HDL 38.10 (L) 11/06/2019 0932   CHOLHDL 4 11/06/2019 0932   VLDL 44.4 (H) 11/06/2019 0932   LDLCALC 46 08/21/2019 0000   LDLDIRECT 44.0 01/05/2020 0857  Physical Exam:    VS:  BP 134/70   Pulse 70   Ht 5' 11" (1.803 m)   Wt 224 lb (101.6 kg)   SpO2 98%   BMI 31.24 kg/m     Wt Readings from Last 3 Encounters:  06/24/20 224 lb (101.6 kg)  06/18/20 223 lb (101.2 kg)  04/07/20 231 lb 12.8 oz (105.1 kg)     GEN:  Well nourished, well developed in no acute distress HEENT: Normal NECK: No JVD; No carotid bruits LYMPHATICS: No lymphadenopathy CARDIAC: RRR, no murmurs, rubs, gallops RESPIRATORY:  Clear to auscultation without rales, wheezing or rhonchi  ABDOMEN: Soft, non-tender, non-distended MUSCULOSKELETAL:  No edema; No deformity  SKIN: Warm and dry NEUROLOGIC:  Alert and oriented x 3 PSYCHIATRIC:  Normal affect   ASSESSMENT:    1. Chest pain of uncertain etiology   2. Essential hypertension   3. Pure hypercholesterolemia    PLAN:    In order of problems listed above:  1. Patient with symptoms of chest pain which appear atypical not related with exertion.  He has risk factors of hypertension, hyperlipidemia, strong family history of CAD.  Will evaluate patient with an echocardiogram for any wall motion abnormalities.  Get a Lexiscan Myoview to evaluate any evidence for ischemia.  He can take over-the-counter Prilosec to help with occasional GERD symptoms. 2. History of hypertension, BP controlled.  Continue current BP meds. 3. History of hyperlipidemia, continue statin.   Follow-up after echocardiogram and Myoview.  Total encounter time 60  minutes  Greater than 50% was spent in counseling and coordination of care with the patient    Medication Adjustments/Labs and Tests Ordered: Current medicines are reviewed at length with the patient today.  Concerns regarding medicines are outlined above.  Orders Placed This Encounter  Procedures  . NM Myocar Multi W/Spect W/Wall Motion / EF  . EKG 12-Lead  . ECHOCARDIOGRAM COMPLETE   No orders of the defined types were placed in this encounter.   Patient Instructions  Medication Instructions:   Prilosec (Over the Counter) Take 1 tablet by mouth daily.  *If you need a refill on your cardiac medications before your next appointment, please call your pharmacy*   Lab Work:  Drive through our [place] testing the day prior to your stress test, located at the front of the medical mall entrance. Hours are 8am-1pm.  If you have labs (blood work) drawn today and your tests are completely normal, you will receive your results only by: Marland Kitchen MyChart Message (if you have MyChart) OR . A paper copy in the mail If you have any lab test that is abnormal or we need to change your treatment, we will call you to review the results.   Testing/Procedures:  1.  Your physician has requested that you have an echocardiogram. Echocardiography is a painless test that uses sound waves to create images of your heart. It provides your doctor with information about the size and shape of your heart and how well your heart's chambers and valves are working. This procedure takes approximately one hour. There are no restrictions for this procedure.  2.  [place] WITH TREADMILL  Your caregiver has ordered a Stress Test with nuclear imaging. The purpose of this test is to evaluate the blood supply to your heart muscle. This procedure is referred to as a "Non-Invasive Stress Test." This is because other than having an IV started in your vein, nothing is inserted or "invades" your body. Cardiac stress tests  are  done to find areas of poor blood flow to the heart by determining the extent of coronary artery disease (CAD). Some patients exercise on a treadmill, which naturally increases the blood flow to your heart, while others who are  unable to walk on a treadmill due to physical limitations have a pharmacologic/chemical stress agent called Lexiscan . This medicine will mimic walking on a treadmill by temporarily increasing your coronary blood flow.       PLEASE REPORT TO [facility] MEDICAL MALL ENTRANCE  THE VOLUNTEERS AT THE FIRST DESK WILL DIRECT YOU WHERE TO GO  **Please note: these test may take anywhere between 2-4 hours to complete   Date of Procedure:_____________________________________  Arrival Time for Procedure:______________________________  Instructions regarding medication:   ____ : Hold diabetes medication morning of procedure  _XX___:  Hold betablocker(s) night before procedure and morning of procedure: carvedilol (COREG)   PLEASE NOTIFY THE OFFICE AT LEAST 24 HOURS IN ADVANCE IF YOU ARE UNABLE TO KEEP YOUR APPOINTMENT.  [phone] AND  PLEASE NOTIFY NUCLEAR MEDICINE AT [facility] AT LEAST 24 HOURS IN ADVANCE IF YOU ARE UNABLE TO KEEP YOUR APPOINTMENT. [phone]  How to prepare for your Myoview test:  1. Do not eat or drink after midnight 2. No caffeine for 24 hours prior to test 3. No smoking 24 hours prior to test. 4. Your medication may be taken with water.  If your doctor stopped a medication because of this test, do not take that medication. 5. No perfume, cologne or lotion.       Follow-Up: At [facility], you and your health needs are our priority.  As part of our continuing mission to provide you with exceptional heart care, we have created designated Provider Care Teams.  These Care Teams include your primary Cardiologist (physician) and Advanced Practice Providers (APPs -  Physician Assistants and Nurse Practitioners) who all work together to provide you with the  care you need, when you need it.  We recommend signing up for the patient portal called "MyChart".  Sign up information is provided on this After Visit Summary.  MyChart is used to connect with patients for Virtual Visits (Telemedicine).  Patients are able to view lab/test results, encounter notes, upcoming appointments, etc.  Non-urgent messages can be sent to your provider as well.   To learn more about what you can do with MyChart, go to [email or web address].    Your next appointment:   Follow up after Echo   The format for your next appointment:   In Person  Provider:   Kate Sable, MD   Other Instructions      Signed, Kate Sable, MD  06/24/2020 1:02 PM    [place]

## 2020-06-29 ENCOUNTER — Encounter: Payer: Self-pay | Admitting: Family Medicine

## 2020-07-01 NOTE — Telephone Encounter (Signed)
I spoke with patient & he had not yet contacted his oral surgeon. He stated that he would do so there first for Xanax prescription.

## 2020-07-05 ENCOUNTER — Telehealth: Payer: Self-pay | Admitting: Cardiology

## 2020-07-05 NOTE — Telephone Encounter (Signed)
Spoke with patient and answered his questions. I informed him that the nuclear medication is not like the contrast dye and is completely eliminated from his system in 6 hours. He stated that he almost died from contrast dye and does not want to ingest anything. I informed him that it would be through an IV, and he stated that 'that was all he needed to know' and hung up on me.

## 2020-07-05 NOTE — Telephone Encounter (Signed)
Patient is calling to specify if he will have to drink or be injected with any contrast dye. Patient is very allergic and curious to know  Please advise

## 2020-07-06 ENCOUNTER — Other Ambulatory Visit: Payer: Medicare Other

## 2020-07-07 ENCOUNTER — Ambulatory Visit: Payer: Medicare Other

## 2020-07-08 ENCOUNTER — Other Ambulatory Visit: Payer: Self-pay

## 2020-07-08 ENCOUNTER — Telehealth: Payer: Self-pay | Admitting: Family Medicine

## 2020-07-12 ENCOUNTER — Other Ambulatory Visit: Payer: Self-pay

## 2020-07-12 ENCOUNTER — Ambulatory Visit (INDEPENDENT_AMBULATORY_CARE_PROVIDER_SITE_OTHER): Payer: Medicare Other | Admitting: Family Medicine

## 2020-07-12 ENCOUNTER — Encounter: Payer: Self-pay | Admitting: Family Medicine

## 2020-07-12 VITALS — BP 130/70 | HR 64 | Temp 98.3°F | Ht 71.0 in | Wt 220.8 lb

## 2020-07-12 DIAGNOSIS — E119 Type 2 diabetes mellitus without complications: Secondary | ICD-10-CM

## 2020-07-12 DIAGNOSIS — K219 Gastro-esophageal reflux disease without esophagitis: Secondary | ICD-10-CM

## 2020-07-12 DIAGNOSIS — Z23 Encounter for immunization: Secondary | ICD-10-CM

## 2020-07-12 DIAGNOSIS — D1802 Hemangioma of intracranial structures: Secondary | ICD-10-CM | POA: Diagnosis not present

## 2020-07-12 DIAGNOSIS — R0789 Other chest pain: Secondary | ICD-10-CM | POA: Diagnosis not present

## 2020-07-12 DIAGNOSIS — I10 Essential (primary) hypertension: Secondary | ICD-10-CM | POA: Diagnosis not present

## 2020-07-12 DIAGNOSIS — R42 Dizziness and giddiness: Secondary | ICD-10-CM | POA: Diagnosis not present

## 2020-07-12 DIAGNOSIS — E785 Hyperlipidemia, unspecified: Secondary | ICD-10-CM

## 2020-07-12 LAB — LDL CHOLESTEROL, DIRECT: Direct LDL: 36 mg/dL

## 2020-07-12 LAB — HEMOGLOBIN A1C: Hgb A1c MFr Bld: 7.1 % — ABNORMAL HIGH (ref 4.6–6.5)

## 2020-07-12 NOTE — Assessment & Plan Note (Signed)
Check LDL.  Continue Crestor 20 mg once daily.

## 2020-07-12 NOTE — Assessment & Plan Note (Addendum)
Well-controlled at home.  Continue amlodipine 10 mg daily, carvedilol 12.5 mg twice daily, HCTZ 12.5 mg daily, and losartan 100 mg daily

## 2020-07-12 NOTE — Assessment & Plan Note (Signed)
Occasional burping though otherwise no symptoms.  He will monitor.

## 2020-07-12 NOTE — Assessment & Plan Note (Signed)
No recurrence.  Given his family history of CAD I do think it is reasonable for him to complete cardiac work-up.  He will follow up with cardiology as planned.

## 2020-07-12 NOTE — Assessment & Plan Note (Signed)
CBGs seem to be slightly elevated.  We will check an A1c.  Continue diet and exercise.

## 2020-07-12 NOTE — Assessment & Plan Note (Signed)
No recurrence.  He will monitor.

## 2020-07-12 NOTE — Patient Instructions (Addendum)
Nice to see. We will get lab work today and contact you with the results. Please follow-up with cardiology as planned.

## 2020-07-12 NOTE — Assessment & Plan Note (Signed)
Patient is concerned about having nuclear medicine stress test with the angioma.  I encouraged him to discuss this with his cardiologist.

## 2020-07-12 NOTE — Progress Notes (Signed)
Jesus Rumps, MD Phone: [phone]  Jesus Peterson is a 74 y.o. male who presents today for f/u.  Chest pain/hypertension: Patient had an episode of chest pain that occurred centrally lasting 2 to 3 seconds.  It recurred several times and he went to the emergency room for evaluation.  EKG and troponins were reassuring.  He was recommended to have outpatient follow-up with cardiology.  He has seen them and they are obtaining an echo.  The patient is concerned about the nuclear medicine stress test with his angioma.  He has had no recurrent chest pain.  The chest pain was not exertional.  There was no shortness of breath or diaphoresis.  He described it as dull.  He does have a family history of CAD.  He is currently taking amlodipine, carvedilol, HCTZ, losartan, and Crestor.  Recent blood pressures at home of been 120/70.  Burping: Patient was having some issues with this.  He took omeprazole for about a week though had diarrhea with that.  He stopped the omeprazole and that resolved.  He said no reflux.  No blood in his stool.  No dysphagia.  Diabetes: No polyuria or polydipsia.  Sugars are running in the 160s.  He rides his bike twice daily.  He has cut down on sweets and starches.  Notes his weight is trending down about a pound every 3 days.  Vertigo: Patient saw ENT.  He reports a negative Dix-Hallpike.  He had carotid Dopplers with no hemodynamically significant stenosis.  He has had no recurrent episodes of vertigo since seeing ENT.  Social History   Tobacco Use  Smoking Status Never Smoker  Smokeless Tobacco Never Used     ROS see history of present illness  Objective  Physical Exam Vitals:   07/12/20 0944  BP: 130/70  Pulse: 64  Temp: 98.3 F (36.8 C)  SpO2: 98%    BP Readings from Last 3 Encounters:  07/12/20 130/70  06/24/20 134/70  06/19/20 (!) 163/96   Wt Readings from Last 3 Encounters:  07/12/20 220 lb 12.8 oz (100.2 kg)  06/24/20 224 lb (101.6 kg)    06/18/20 223 lb (101.2 kg)    Physical Exam Constitutional:      General: He is not in acute distress.    Appearance: He is not diaphoretic.  Cardiovascular:     Rate and Rhythm: Normal rate and regular rhythm.     Heart sounds: Normal heart sounds.  Pulmonary:     Effort: Pulmonary effort is normal.     Breath sounds: Normal breath sounds.  Musculoskeletal:     Right lower leg: No edema.     Left lower leg: No edema.  Skin:    General: Skin is warm and dry.  Neurological:     Mental Status: He is alert.      Assessment/Plan: Please see individual problem list.  Hypertension Well-controlled at home.  Continue amlodipine 10 mg daily, carvedilol 12.5 mg twice daily, HCTZ 12.5 mg daily, and losartan 100 mg daily  GERD (gastroesophageal reflux disease) Occasional burping though otherwise no symptoms.  He will monitor.  Diabetes type 2, controlled CBGs seem to be slightly elevated.  We will check an A1c.  Continue diet and exercise.  Angioma Patient is concerned about having nuclear medicine stress test with the angioma.  I encouraged him to discuss this with his cardiologist.  Dizziness No recurrence.  He will monitor.  Hyperlipidemia Check LDL.  Continue Crestor 20 mg once daily.  Atypical  chest pain No recurrence.  Given his family history of CAD I do think it is reasonable for him to complete cardiac work-up.  He will follow up with cardiology as planned.   Health Maintenance: reports he had his COVID vaccine.   Orders Placed This Encounter  Procedures  . HgB A1c  . Direct LDL    No orders of the defined types were placed in this encounter.   Mikeal was seen today for follow-up.  Diagnoses and all orders for this visit:  Essential hypertension  Gastroesophageal reflux disease, unspecified whether esophagitis present  Controlled type 2 diabetes mellitus without complication, without long-term current use of insulin (HCC) -     HgB A1c  Hemangioma  of intracranial structure (HCC)  Dizziness  Hyperlipidemia, unspecified hyperlipidemia type -     Direct LDL  Atypical chest pain     This visit occurred during the SARS-CoV-2 public health emergency.  Safety protocols were in place, including screening questions prior to the visit, additional usage of staff PPE, and extensive cleaning of exam room while observing appropriate contact time as indicated for disinfecting solutions.    Jesus Rumps, MD [place]

## 2020-07-15 ENCOUNTER — Ambulatory Visit (INDEPENDENT_AMBULATORY_CARE_PROVIDER_SITE_OTHER): Payer: Medicare Other

## 2020-07-15 ENCOUNTER — Other Ambulatory Visit: Payer: Self-pay

## 2020-07-15 DIAGNOSIS — R079 Chest pain, unspecified: Secondary | ICD-10-CM

## 2020-07-15 LAB — ECHOCARDIOGRAM COMPLETE
Area-P 1/2: 3.65 cm2
S' Lateral: 2.5 cm

## 2020-07-19 ENCOUNTER — Telehealth: Payer: Self-pay

## 2020-07-19 NOTE — Telephone Encounter (Signed)
LMOM for patient to call back.

## 2020-07-19 NOTE — Telephone Encounter (Signed)
-----   Message from [name], MD sent at 07/19/2020  9:35 AM EDT ----- Please let the patient know his A1c just above goal at 7.1.  I would suggest that we start medication for this.  Is he willing to start on medication?  Has he had GI issues with Metformin in the past?  His LDL is well controlled.

## 2020-07-19 NOTE — Telephone Encounter (Signed)
I spoke with the patient and he is willing to go on medication if the provider thinks its needed but the patient stated that he had GI issues with metformin in the past and it was 2-3 years ago.  I informed him that I would call back with another medicine once reviewed and he understood.  Belal Scallon,cma

## 2020-07-19 NOTE — Telephone Encounter (Signed)
Pt returned your call.  

## 2020-07-19 NOTE — Telephone Encounter (Signed)
Patient returning call and would like to wait until Thursday and discuss with MD

## 2020-07-19 NOTE — Telephone Encounter (Signed)
The patient has been notified of the result and verbalized understanding.  All questions (if any) were answered.     

## 2020-07-20 ENCOUNTER — Other Ambulatory Visit: Payer: Self-pay | Admitting: Family Medicine

## 2020-07-22 ENCOUNTER — Other Ambulatory Visit: Payer: Self-pay

## 2020-07-22 ENCOUNTER — Ambulatory Visit (INDEPENDENT_AMBULATORY_CARE_PROVIDER_SITE_OTHER): Payer: Medicare Other | Admitting: Cardiology

## 2020-07-22 ENCOUNTER — Encounter: Payer: Self-pay | Admitting: Cardiology

## 2020-07-22 VITALS — BP 118/68 | HR 61 | Ht 71.0 in | Wt 224.5 lb

## 2020-07-22 DIAGNOSIS — I10 Essential (primary) hypertension: Secondary | ICD-10-CM | POA: Diagnosis not present

## 2020-07-22 DIAGNOSIS — E7800 Pure hypercholesterolemia, unspecified: Secondary | ICD-10-CM

## 2020-07-22 DIAGNOSIS — R079 Chest pain, unspecified: Secondary | ICD-10-CM

## 2020-07-22 NOTE — Telephone Encounter (Signed)
I would suggest starting jardiance 10 mg once daily. This could be sent to his pharmacy with 90 tablets and 1 refill.  While he is taking this medicine if he develops an illness with vomiting, diarrhea, or poor oral intake he should stop the medication and then resume it once the symptoms resolve.

## 2020-07-22 NOTE — Patient Instructions (Signed)

## 2020-07-22 NOTE — Telephone Encounter (Signed)
I called and lvm for the patient to call back.  Genell Thede,cma

## 2020-07-22 NOTE — Progress Notes (Signed)
Cardiology Office Note:    Date:  07/22/2020   ID:  Jesus Peterson, DOB [birth year], MRN [id number]  PCP:  [name], MD  [facility] Cardiologist:  Kate Sable, MD  [place] Electrophysiologist:  None   Referring MD: [name], MD   Chief Complaint  Patient presents with  . Follow-up    Echo results. The patient did not want to have the nuclear stress test. Meds reviewed by the pt. verbally. "doing well."     History of Present Illness:    Jesus Peterson is a 74 y.o. male with a hx of hypertension, hyperlipidemia who presents for follow-up.  Patient last seen due to chest pain symptoms lasting 5 days prompting ED visit where work-up was unrevealing.  Due to risk factors including strong family history of CAD, echocardiogram and Lexiscan Myoview was ordered.  He had echocardiogram performed.  Did not get Lexiscan Myoview due to nuclear perfusion material and pontine angioma bleeding concerns.  He now presents for results.  He states symptoms of chest discomfort have resolved since starting OTC Prilosec.  Feels okay, wants to know if he can take 81 mg aspirin daily.  He was recently diagnosed with diabetes.  Prior notes He has a strong family history of CAD with his dad having MI in his 84s.  2 brothers have coronary artery disease both in their 85s.  A sister who was asymptomatic recently had stents placed in her 36s.  Patient rides a bike outside 5 days a week for about an hour without any symptoms of chest pain or shortness of breath.   Past Medical History:  Diagnosis Date  . Actinic keratosis 08/18/2010   Left forearm. Hypertrophic.  . Cancer ([place])    MELANOMA OF SKIN  . Chronic kidney disease   . Dysplastic nevus 07/06/2010   Right back. Moderate atypia.   . Erectile dysfunction   . History of kidney stones   . Hyperlipidemia   . Hypertension   . Melanoma ([place]) 10/17/2017   L infraorbitial cheek, Melanoma IS txted with [facility] Dr. Lacinda Axon,  pathology in media  . Seborrheic dermatitis   . Squamous cell carcinoma of skin ?   R hand, txted by Dr. Phillip Heal  . Treadmill stress test negative for angina pectoris 2013   Dr. Nehemiah Massed, normal per pt  . Valvular heart disease   . Venous angioma of brain (HCC)    Pons, found 2/2 tinnitus, followed on MRI    Past Surgical History:  Procedure Laterality Date  . COLONOSCOPY  08/24/2006,08/23/2012  . COLONOSCOPY WITH PROPOFOL N/A 09/09/2018   Procedure: COLONOSCOPY WITH PROPOFOL;  Surgeon: Lollie Sails, MD;  Location: [facility] ENDOSCOPY;  Service: Endoscopy;  Laterality: N/A;    Current Medications: Current Meds  Medication Sig  . ALPRAZolam (XANAX) 0.5 MG tablet   . amLODipine (NORVASC) 10 MG tablet TAKE 1 TABLET EVERY DAY  . Bayer Microlet Lancets lancets USE  TWICE DAILY ICD 10 E11.9  . blood glucose meter kit and supplies KIT Dispense based on patient and insurance preference. Check once daily fasting, Dx Code E11.9  . Blood Pressure Monitoring (ADULT BLOOD PRESSURE CUFF LG) KIT Use daily to check blood pressure  . carvedilol (COREG) 12.5 MG tablet Take 12.5 mg by mouth 2 (two) times daily with a meal.  . clindamycin (CLEOCIN) 300 MG capsule Take 300 mg by mouth as needed.  Marland Kitchen GARLIC OIL PO Take 1 capsule by mouth 4 (four) times daily.  Marland Kitchen  glucose blood (BAYER CONTOUR NEXT TEST) test strip USE  STRIP TO CHECK GLUCOSE TWICE DAILY E11.9  . hydrochlorothiazide (HYDRODIURIL) 12.5 MG tablet TAKE 1 TABLET EVERY DAY  . hydrocortisone 2.5 % cream Apply topically as directed. Qd up to 4 days a week to aa face and under arms prn flares  . ketoconazole (NIZORAL) 2 % cream Apply 1 application topically as directed. Qd to aa rash on face and under arms prn flares  . losartan (COZAAR) 100 MG tablet TAKE 1 TABLET EVERY DAY  . rosuvastatin (CRESTOR) 20 MG tablet TAKE 1 TABLET (20 MG TOTAL) BY MOUTH DAILY.     Allergies:   Contrast media [iodinated diagnostic agents] and Penicillins   Social  History   Socioeconomic History  . Marital status: Married    Spouse name: Not on file  . Number of children: Not on file  . Years of education: Not on file  . Highest education level: Not on file  Occupational History  . Not on file  Tobacco Use  . Smoking status: Never Smoker  . Smokeless tobacco: Never Used  Vaping Use  . Vaping Use: Never used  Substance and Sexual Activity  . Alcohol use: No  . Drug use: Never  . Sexual activity: Not Currently  Other Topics Concern  . Not on file  Social History Narrative   Lives in [place] with wife, 44years. Children - son 3 and daughter 34.   Work - Scientist, research (physical sciences)   Diet - regular   Exercise - limited, walks occas   Social Determinants of Radio broadcast assistant Strain:   . Difficulty of Paying Living Expenses: Not on file  Food Insecurity:   . Worried About Charity fundraiser in the Last Year: Not on file  . Ran Out of Food in the Last Year: Not on file  Transportation Needs:   . Lack of Transportation (Medical): Not on file  . Lack of Transportation (Non-Medical): Not on file  Physical Activity: Insufficiently Active  . Days of Exercise per Week: 7 days  . Minutes of Exercise per Session: 20 min  Stress:   . Feeling of Stress : Not on file  Social Connections:   . Frequency of Communication with Friends and Family: Not on file  . Frequency of Social Gatherings with Friends and Family: Not on file  . Attends Religious Services: Not on file  . Active Member of Clubs or Organizations: Not on file  . Attends Archivist Meetings: Not on file  . Marital Status: Not on file     Family History: The patient's family history includes Arthritis in his brother; Heart disease in his brother, brother, father, and mother; Stroke in his mother.  ROS:   Please see the history of present illness.     All other systems reviewed and are negative.  EKGs/Labs/Other Studies Reviewed:    The following studies were reviewed  today:   EKG:  EKG was not ordered today.  Recent Labs: 04/06/2020: ALT 21 06/18/2020: BUN 25; Creatinine, Ser 1.02; Hemoglobin 15.5; Platelets 148; Potassium 4.0; Sodium 140  Recent Lipid Panel    Component Value Date/Time   CHOL 135 11/06/2019 0932   TRIG 222.0 (H) 11/06/2019 0932   HDL 38.10 (L) 11/06/2019 0932   CHOLHDL 4 11/06/2019 0932   VLDL 44.4 (H) 11/06/2019 0932   LDLCALC 46 08/21/2019 0000   LDLDIRECT 36.0 07/12/2020 1007    Physical Exam:    VS:  BP 118/68 (  BP Location: Left Arm, Patient Position: Sitting, Cuff Size: Normal)   Pulse 61   Ht '5\' 11"'  (1.803 m)   Wt 224 lb 8 oz (101.8 kg)   SpO2 99%   BMI 31.31 kg/m     Wt Readings from Last 3 Encounters:  07/22/20 224 lb 8 oz (101.8 kg)  07/12/20 220 lb 12.8 oz (100.2 kg)  06/24/20 224 lb (101.6 kg)     GEN:  Well nourished, well developed in no acute distress HEENT: Normal NECK: No JVD; No carotid bruits LYMPHATICS: No lymphadenopathy CARDIAC: RRR, no murmurs, rubs, gallops RESPIRATORY:  Clear to auscultation without rales, wheezing or rhonchi  ABDOMEN: Soft, non-tender, non-distended MUSCULOSKELETAL:  No edema; No deformity  SKIN: Warm and dry NEUROLOGIC:  Alert and oriented x 3 PSYCHIATRIC:  Normal affect   ASSESSMENT:    1. Chest pain of uncertain etiology   2. Essential hypertension   3. Pure hypercholesterolemia    PLAN:    In order of problems listed above:  1. Patient with symptoms of atypical chest pain.  He has risk factors of hypertension, hyperlipidemia, strong family history of CAD.  Echocardiogram 06/2020 showed normal systolic and diastolic function, EF 60 to 65%.   Symptoms not likely of cardiac origin since they have improved with Prilosec.  He can continue Prilosec as needed for reflux symptoms.  Okay from my perspective for aspirin 81 mg daily due to recent diagnosis with diabetes and also history of hyperlipidemia. 2. History of hypertension, BP controlled.  Continue current BP  meds. 3. History of hyperlipidemia, continue statin.   Follow-up in 1 year or earlier as needed  Total encounter time 40 minutes  Greater than 50% was spent in counseling and coordination of care with the patient    Medication Adjustments/Labs and Tests Ordered: Current medicines are reviewed at length with the patient today.  Concerns regarding medicines are outlined above.  No orders of the defined types were placed in this encounter.  No orders of the defined types were placed in this encounter.   Patient Instructions  Medication Instructions:  Your physician recommends that you continue on your current medications as directed. Please refer to the Current Medication list given to you today.  *If you need a refill on your cardiac medications before your next appointment, please call your pharmacy*   Lab Work: None Ordered If you have labs (blood work) drawn today and your tests are completely normal, you will receive your results only by: Marland Kitchen MyChart Message (if you have MyChart) OR . A paper copy in the mail If you have any lab test that is abnormal or we need to change your treatment, we will call you to review the results.   Testing/Procedures: None Ordered   Follow-Up: At [facility], you and your health needs are our priority.  As part of our continuing mission to provide you with exceptional heart care, we have created designated Provider Care Teams.  These Care Teams include your primary Cardiologist (physician) and Advanced Practice Providers (APPs -  Physician Assistants and Nurse Practitioners) who all work together to provide you with the care you need, when you need it.  We recommend signing up for the patient portal called "MyChart".  Sign up information is provided on this After Visit Summary.  MyChart is used to connect with patients for Virtual Visits (Telemedicine).  Patients are able to view lab/test results, encounter notes, upcoming appointments, etc.   Non-urgent messages can be sent to your  provider as well.   To learn more about what you can do with MyChart, go to [email or web address].    Your next appointment:   1 year(s)  The format for your next appointment:   In Person  Provider:   Kate Sable, MD   Other Instructions      Signed, Kate Sable, MD  07/22/2020 12:31 PM    [place]

## 2020-07-23 ENCOUNTER — Ambulatory Visit: Payer: Medicare Other

## 2020-07-23 ENCOUNTER — Ambulatory Visit (INDEPENDENT_AMBULATORY_CARE_PROVIDER_SITE_OTHER): Payer: Medicare Other

## 2020-07-23 VITALS — BP 120/68 | Ht 71.0 in | Wt 224.0 lb

## 2020-07-23 DIAGNOSIS — R42 Dizziness and giddiness: Secondary | ICD-10-CM | POA: Diagnosis not present

## 2020-07-23 DIAGNOSIS — Z0000 Encounter for general adult medical examination without abnormal findings: Secondary | ICD-10-CM

## 2020-07-23 NOTE — Patient Instructions (Addendum)
Mr. Jesus Peterson , Thank you for taking time to come for your Medicare Wellness Visit. I appreciate your ongoing commitment to your health goals. Please review the following plan we discussed and let me know if I can assist you in the future.   These are the goals we discussed: Goals      Patient Stated     "I want to work on my diabetes" (pt-stated)      [place] (see longtitudinal plan of care for additional care plan information)  Current Barriers:   Social, financial, and community barriers:  o Reports episodes of vertigo remain few and far between. Working w/ ENT for wokr up  Diabetes: CONTROLLED; complicated by chronic medical conditions including HTN, CKD, most recent A1c 6.5%  Most recent eGFR: ~88 mL/min  Current antihyperglycemic regimen: none  o Hx metformin XR; significant nausea preventing use  Current meal patterns: o Avoiding carbohydrates, sweets, breads  Current exercise o Riding his bike daily for ~30 minutes  Cardiovascular risk reduction: o Current hypertensive regimen: amlodipine 10 mg, carvedilol 12.5 mg BID; HCTZ 12.5 mg QAM, losartan 100 mg QAM; 128/73; HR 63; 140/74; HR 58 o Current hyperlipidemia regimen: rosuvastatin 20 mg daily; LDL at goal <70 o Current antiplatelet regimen: none  Vertigo: working w/ Dr. Pryor Ochoa, had cartoid artery scan. Recommended considering holding HCTZ  Pharmacist Clinical Goal(s):   Over the next 90 days, patient will work with PharmD and primary care provider to address optimized medication management  Interventions:  Comprehensive medication review performed, medication list updated in electronic medical record  Inter-disciplinary care team collaboration (see longitudinal plan of care)  Praised for attainment of goal A1c. Discussed long term micro and macrovascular complications of uncontrolled DM.   Reviewed goal A1c, goal fasting, goal 2 hour post prandial glucose  Reviewed current antihypertensive regimen.  Discussed appropriate BP checking technique. Discussed concepts of orthostatic hypotension. Patient will continue to monitor BP at home and bring readings to f/u with PCP and ENT.  Discussed goal LDL, discussed importance for primary prevention.   Patient Self Care Activities:   Patient will check blood glucose QD-BID, document, and provide at future appointments  Patient will take medications as prescribed  Patient will report any questions or concerns to provider   Please see past updates related to this goal by clicking on the "Past Updates" button in the selected goal         This is a list of the screening recommended for you and due dates:  Health Maintenance  Topic Date Due   Tetanus Vaccine  07/23/2021*   Hemoglobin A1C  01/09/2021   Eye exam for diabetics  03/30/2021   Complete foot exam   04/07/2021   Colon Cancer Screening  09/10/2023   Flu Shot  Completed   COVID-19 Vaccine  Completed    Hepatitis C: One time screening is recommended by Center for Disease Control  (CDC) for  adults born from [birth year] through 1966.   Completed   Pneumonia vaccines  Completed  *Topic was postponed. The date shown is not the original due date.   Keep all routine maintenance appointments.   Follow up CCM 08/05/20 @ 1:45  Follow up 11/13/19  Conditions/risks identified: none new  Follow up in one year for your annual wellness visit.   Preventive Care 53 Years and Older, Male Preventive care refers to lifestyle choices and visits with your health care provider that can promote health and wellness. What does preventive care include?  A yearly physical exam. This is also called an annual well check.  Dental exams once or twice a year.  Routine eye exams. Ask your health care provider how often you should have your eyes checked.  Personal lifestyle choices, including:  Daily care of your teeth and gums.  Regular physical activity.  Eating a healthy diet.  Avoiding  tobacco and drug use.  Limiting alcohol use.  Practicing safe sex.  Taking low doses of aspirin every day.  Taking vitamin and mineral supplements as recommended by your health care provider. What happens during an annual well check? The services and screenings done by your health care provider during your annual well check will depend on your age, overall health, lifestyle risk factors, and family history of disease. Counseling  Your health care provider may ask you questions about your:  Alcohol use.  Tobacco use.  Drug use.  Emotional well-being.  Home and relationship well-being.  Sexual activity.  Eating habits.  History of falls.  Memory and ability to understand (cognition).  Work and work Statistician. Screening  You may have the following tests or measurements:  Height, weight, and BMI.  Blood pressure.  Lipid and cholesterol levels. These may be checked every 5 years, or more frequently if you are over 19 years old.  Skin check.  Lung cancer screening. You may have this screening every year starting at age 65 if you have a 30-pack-year history of smoking and currently smoke or have quit within the past 15 years.  Fecal occult blood test (FOBT) of the stool. You may have this test every year starting at age 62.  Flexible sigmoidoscopy or colonoscopy. You may have a sigmoidoscopy every 5 years or a colonoscopy every 10 years starting at age 18.  Prostate cancer screening. Recommendations will vary depending on your family history and other risks.  Hepatitis C blood test.  Hepatitis B blood test.  Sexually transmitted disease (STD) testing.  Diabetes screening. This is done by checking your blood sugar (glucose) after you have not eaten for a while (fasting). You may have this done every 1-3 years.  Abdominal aortic aneurysm (AAA) screening. You may need this if you are a current or former smoker.  Osteoporosis. You may be screened starting at age  87 if you are at high risk. Talk with your health care provider about your test results, treatment options, and if necessary, the need for more tests. Vaccines  Your health care provider may recommend certain vaccines, such as:  Influenza vaccine. This is recommended every year.  Tetanus, diphtheria, and acellular pertussis (Tdap, Td) vaccine. You may need a Td booster every 10 years.  Zoster vaccine. You may need this after age 43.  Pneumococcal 13-valent conjugate (PCV13) vaccine. One dose is recommended after age 36.  Pneumococcal polysaccharide (PPSV23) vaccine. One dose is recommended after age 59. Talk to your health care provider about which screenings and vaccines you need and how often you need them. This information is not intended to replace advice given to you by your health care provider. Make sure you discuss any questions you have with your health care provider. Document Released: 10/29/2015 Document Revised: 06/21/2016 Document Reviewed: 08/03/2015 Elsevier Interactive Patient Education  2017 [place] Prevention in the Home Falls can cause injuries. They can happen to people of all ages. There are many things you can do to make your home safe and to help prevent falls. What can I do on the outside of my  home?  Regularly fix the edges of walkways and driveways and fix any cracks.  Remove anything that might make you trip as you walk through a door, such as a raised step or threshold.  Trim any bushes or trees on the path to your home.  Use bright outdoor lighting.  Clear any walking paths of anything that might make someone trip, such as rocks or tools.  Regularly check to see if handrails are loose or broken. Make sure that both sides of any steps have handrails.  Any raised decks and porches should have guardrails on the edges.  Have any leaves, snow, or ice cleared regularly.  Use sand or salt on walking paths during winter.  Clean up any spills  in your garage right away. This includes oil or grease spills. What can I do in the bathroom?  Use night lights.  Install grab bars by the toilet and in the tub and shower. Do not use towel bars as grab bars.  Use non-skid mats or decals in the tub or shower.  If you need to sit down in the shower, use a plastic, non-slip stool.  Keep the floor dry. Clean up any water that spills on the floor as soon as it happens.  Remove soap buildup in the tub or shower regularly.  Attach bath mats securely with double-sided non-slip rug tape.  Do not have throw rugs and other things on the floor that can make you trip. What can I do in the bedroom?  Use night lights.  Make sure that you have a light by your bed that is easy to reach.  Do not use any sheets or blankets that are too big for your bed. They should not hang down onto the floor.  Have a firm chair that has side arms. You can use this for support while you get dressed.  Do not have throw rugs and other things on the floor that can make you trip. What can I do in the kitchen?  Clean up any spills right away.  Avoid walking on wet floors.  Keep items that you use a lot in easy-to-reach places.  If you need to reach something above you, use a strong step stool that has a grab bar.  Keep electrical cords out of the way.  Do not use floor polish or wax that makes floors slippery. If you must use wax, use non-skid floor wax.  Do not have throw rugs and other things on the floor that can make you trip. What can I do with my stairs?  Do not leave any items on the stairs.  Make sure that there are handrails on both sides of the stairs and use them. Fix handrails that are broken or loose. Make sure that handrails are as long as the stairways.  Check any carpeting to make sure that it is firmly attached to the stairs. Fix any carpet that is loose or worn.  Avoid having throw rugs at the top or bottom of the stairs. If you do  have throw rugs, attach them to the floor with carpet tape.  Make sure that you have a light switch at the top of the stairs and the bottom of the stairs. If you do not have them, ask someone to add them for you. What else can I do to help prevent falls?  Wear shoes that:  Do not have high heels.  Have rubber bottoms.  Are comfortable and fit you well.  Are  closed at the toe. Do not wear sandals.  If you use a stepladder:  Make sure that it is fully opened. Do not climb a closed stepladder.  Make sure that both sides of the stepladder are locked into place.  Ask someone to hold it for you, if possible.  Clearly mark and make sure that you can see:  Any grab bars or handrails.  First and last steps.  Where the edge of each step is.  Use tools that help you move around (mobility aids) if they are needed. These include:  Canes.  Walkers.  Scooters.  Crutches.  Turn on the lights when you go into a dark area. Replace any light bulbs as soon as they burn out.  Set up your furniture so you have a clear path. Avoid moving your furniture around.  If any of your floors are uneven, fix them.  If there are any pets around you, be aware of where they are.  Review your medicines with your doctor. Some medicines can make you feel dizzy. This can increase your chance of falling. Ask your doctor what other things that you can do to help prevent falls. This information is not intended to replace advice given to you by your health care provider. Make sure you discuss any questions you have with your health care provider. Document Released: 07/29/2009 Document Revised: 03/09/2016 Document Reviewed: 11/06/2014 Elsevier Interactive Patient Education  2017 [employer].

## 2020-07-23 NOTE — Telephone Encounter (Signed)
Lvm FOR THE PATIENT TO CALL BACK.  Jesus Peterson,CMA

## 2020-07-23 NOTE — Telephone Encounter (Signed)
Patient called in stated that he was returning call he is at home now

## 2020-07-23 NOTE — Progress Notes (Signed)
Subjective:   NHAT HEARNE is a 74 y.o. male who presents for Medicare Annual/Subsequent preventive examination.  Review of Systems    No ROS.  Medicare Wellness Virtual Visit.   Cardiac Risk Factors include: advanced age (>57mn, >>39women);diabetes mellitus;male gender;hypertension     Objective:    Today's Vitals   07/23/20 0935  BP: 120/68  Weight: 224 lb (101.6 kg)  Height: '5\' 11"'  (1.803 m)   Body mass index is 31.24 kg/m.  Advanced Directives 07/23/2020 06/18/2020 04/06/2020 07/23/2019 09/09/2018 06/27/2018 06/26/2017  Does Patient Have a Medical Advance Directive? Yes No No Yes Yes Yes Yes  Type of Advance Directive - - -Public librarianLiving will - Living will Living will  Does patient want to make changes to medical advance directive? No - Patient declined - - No - Patient declined - No - Patient declined No - Patient declined  Copy of HLanein Chart? - - - No - copy requested - - -  Would patient like information on creating a medical advance directive? - No - Patient declined - - - - -    Current Medications (verified) Outpatient Encounter Medications as of 07/23/2020  Medication Sig  . ALPRAZolam (XANAX) 0.5 MG tablet   . amLODipine (NORVASC) 10 MG tablet TAKE 1 TABLET EVERY DAY  . Bayer Microlet Lancets lancets USE  TWICE DAILY ICD 10 E11.9  . blood glucose meter kit and supplies KIT Dispense based on patient and insurance preference. Check once daily fasting, Dx Code E11.9  . Blood Pressure Monitoring (ADULT BLOOD PRESSURE CUFF LG) KIT Use daily to check blood pressure  . carvedilol (COREG) 12.5 MG tablet Take 12.5 mg by mouth 2 (two) times daily with a meal.  . clindamycin (CLEOCIN) 300 MG capsule Take 300 mg by mouth as needed.  .Marland KitchenGARLIC OIL PO Take 1 capsule by mouth 4 (four) times daily.  .Marland Kitchenglucose blood (BAYER CONTOUR NEXT TEST) test strip USE  STRIP TO CHECK GLUCOSE TWICE DAILY E11.9  . hydrochlorothiazide (HYDRODIURIL)  12.5 MG tablet TAKE 1 TABLET EVERY DAY  . hydrocortisone 2.5 % cream Apply topically as directed. Qd up to 4 days a week to aa face and under arms prn flares  . ketoconazole (NIZORAL) 2 % cream Apply 1 application topically as directed. Qd to aa rash on face and under arms prn flares  . losartan (COZAAR) 100 MG tablet TAKE 1 TABLET EVERY DAY  . rosuvastatin (CRESTOR) 20 MG tablet TAKE 1 TABLET (20 MG TOTAL) BY MOUTH DAILY.   No facility-administered encounter medications on file as of 07/23/2020.    Allergies (verified) Contrast media [iodinated diagnostic agents] and Penicillins   History: Past Medical History:  Diagnosis Date  . Actinic keratosis 08/18/2010   Left forearm. Hypertrophic.  . Cancer ([facility]    MELANOMA OF SKIN  . Chronic kidney disease   . Dysplastic nevus 07/06/2010   Right back. Moderate atypia.   . Erectile dysfunction   . History of kidney stones   . Hyperlipidemia   . Hypertension   . Melanoma ([facility] 10/17/2017   L infraorbitial cheek, [facility]  . Seborrheic dermatitis   . Squamous cell carcinoma of skin ?   R hand, txted by Dr. GPhillip Heal . Treadmill stress test negative for angina pectoris 2013   Dr. KNehemiah Massed normal per pt  . Valvular heart disease   . Venous angioma of brain (  [place])    Pons, found 2/2 tinnitus, followed on MRI   Past Surgical History:  Procedure Laterality Date  . COLONOSCOPY  08/24/2006,08/23/2012  . COLONOSCOPY WITH PROPOFOL N/A 09/09/2018   Procedure: COLONOSCOPY WITH PROPOFOL;  Surgeon: Lollie Sails, MD;  Location[facility]OSCOPY;  Service: Endoscopy;  Laterality: N/A;   Family History  Problem Relation Age of Onset  . Stroke Mother   . Heart disease Mother        s/p CABG  . Heart disease Father   . Heart attack Sister   . Heart disease Brother        s/p CABG  . Heart disease Brother        s/p angioplasty  . Arthritis Brother    Social History   Socioeconomic History   . Marital status: Married    Spouse name: Not on file  . Number of children: Not on file  . Years of education: Not on file  . Highest education level: Not on file  Occupational History  . Not on file  Tobacco Use  . Smoking status: Never Smoker  . Smokeless tobacco: Never Used  Vaping Use  . Vaping Use: Never used  Substance and Sexual Activity  . Alcohol use: No  . Drug use: Never  . Sexual activity: Not Currently  Other Topics Concern  . Not on file  Social History Narrative   Lives in [place] with wife, 44years. Children - son 67 and daughter 25.   Work - Scientist, research (physical sciences)   Diet - regular   Exercise - limited, walks occas   Social Determinants of Radio broadcast assistant Strain: Low Risk   . Difficulty of Paying Living Expenses: Not hard at all  Food Insecurity: No Food Insecurity  . Worried About Charity fundraiser in the Last Year: Never true  . Ran Out of Food in the Last Year: Never true  Transportation Needs: No Transportation Needs  . Lack of Transportation (Medical): No  . Lack of Transportation (Non-Medical): No  Physical Activity: Insufficiently Active  . Days of Exercise per Week: 7 days  . Minutes of Exercise per Session: 20 min  Stress: No Stress Concern Present  . Feeling of Stress : Not at all  Social Connections: Unknown  . Frequency of Communication with Friends and Family: Not on file  . Frequency of Social Gatherings with Friends and Family: Not on file  . Attends Religious Services: Not on file  . Active Member of Clubs or Organizations: Not on file  . Attends Archivist Meetings: Not on file  . Marital Status: Married    Tobacco Counseling Counseling given: Not Answered   Clinical Intake:  Pre-visit preparation completed: Yes        Diabetes: Yes (Followed by pcp. FBS provided 158)  How often do you need to have someone help you when you read instructions, pamphlets, or other written materials from your doctor or pharmacy?: 1 -  Never Interpreter Needed?: No      Activities of Daily Living In your present state of health, do you have any difficulty performing the following activities: 07/23/2020  Hearing? Y  Vision? N  Difficulty concentrating or making decisions? N  Walking or climbing stairs? N  Dressing or bathing? N  Doing errands, shopping? N  Preparing Food and eating ? N  Using the Toilet? N  In the past six months, have you accidently leaked urine? N  Do you have problems with loss  of bowel control? N  Managing your Medications? N  Managing your Finances? N  Housekeeping or managing your Housekeeping? N  Some recent data might be hidden    Patient Care Team: [name], MD as PCP - General (Family Medicine) Kate Sable, MD as PCP - Cardiology (Cardiology) Corey Skains, MD as Referring Physician (Internal Medicine) Manya Silvas, MD (Inactive) (Gastroenterology) Oneta Rack, MD (Dermatology) De Hollingshead, RPH-CPP as Pharmacist (Pharmacist)  Indicate any recent Medical Services you may have received from other than [facility] providers in the past year (date may be approximate).     Assessment:   This is a routine wellness examination for Menashe.  I connected with Stefan today by telephone and verified that I am speaking with the correct person using two identifiers. Location patient: home Location provider: work Persons participating in the virtual visit: patient, Marine scientist.    I discussed the limitations, risks, security and privacy concerns of performing an evaluation and management service by telephone and the availability of in person appointments. The patient expressed understanding and verbally consented to this telephonic visit.    Interactive audio and video telecommunications were attempted between this provider and patient, however failed, due to patient having technical difficulties OR patient did not have access to video capability.  We continued and  completed visit with audio only.  Some vital signs may be absent or patient reported.   Hearing/Vision screen  Hearing Screening   '125Hz'  '250Hz'  '500Hz'  '1000Hz'  '2000Hz'  '3000Hz'  '4000Hz'  '6000Hz'  '8000Hz'   Right ear:           Left ear:           Comments: Followed by [facility] and   [place] Screening Comments: Visual acuity not assessed, virtual visit. They have seen their ophthalmologist in the last 12 months.   Dietary issues and exercise activities discussed: Current Exercise Habits: Home exercise routine, Intensity: Mild Healthy diet Good water intake  Goal: Follow up with pcp as needed  Depression Screen PHQ 2/9 Scores 07/23/2020 07/12/2020 04/07/2020 12/23/2019 09/23/2019 07/23/2019 06/27/2018  PHQ - 2 Score 0 0 0 0 0 0 0  PHQ- 9 Score - - - - - - -    Fall Risk Fall Risk  07/23/2020 07/12/2020 12/23/2019 09/23/2019 09/10/2019  Falls in the past year? 0 0 0 0 0  Comment - - - - Emmi Telephone Survey: data to providers prior to load  Number falls in past yr: 0 0 0 0 -  Follow up Falls evaluation completed Falls evaluation completed Falls evaluation completed Falls evaluation completed -   Handrails in use when climbing stairs? Yes Home free of loose throw rugs in walkways, pet beds, electrical cords, etc? Yes  Adequate lighting in your home to reduce risk of falls? Yes   ASSISTIVE DEVICES UTILIZED TO PREVENT FALLS: Use of a cane, walker or w/c? No   TIMED UP AND GO:  Was the test performed? No . Virtual visit.   Cognitive Function: Patient is alert and oriented x3.  Denies difficulty focusing, making decisions, memory loss.  Enjoys brain challenging activities.   MMSE - Mini Mental State Exam 02/12/2015  Orientation to time 5  Orientation to Place 5  Registration 3  Attention/ Calculation 5  Recall 3  Language- name 2 objects 2  Language- repeat 1  Language- follow 3 step command 3  Language- read & follow direction 1  Write a sentence 1  Copy design 1  Total score 30     6CIT Screen 07/23/2019 06/27/2018 06/26/2017  What Year? 0 points 0 points 0 points  What month? 0 points 0 points 0 points  What time? 0 points 0 points 0 points  Count back from 20 0 points 0 points 0 points  Months in reverse 0 points 0 points 0 points  Repeat phrase 0 points 0 points 0 points  Total Score 0 0 0    Immunizations Immunization History  Administered Date(s) Administered  . Fluad Quad(high Dose 65+) 07/12/2020  . Influenza Split 03/05/2011, 08/02/2012, 07/30/2014  . Influenza, Quadrivalent, Recombinant, Inj, Pf 07/17/2019  . Influenza,inj,Quad PF,6+ Mos 07/20/2017  . Influenza-Unspecified 08/06/2015, 08/16/2016, 07/19/2018, 07/26/2019  . Moderna SARS-COVID-2 Vaccination 02/25/2020, 03/24/2020  . Pneumococcal Conjugate-13 11/07/2013  . Pneumococcal Polysaccharide-23 03/05/2011   Health Maintenance Health Maintenance  Topic Date Due  . TETANUS/TDAP  07/23/2021 (Originally 04/02/1965)  . HEMOGLOBIN A1C  01/09/2021  . OPHTHALMOLOGY EXAM  03/30/2021  . FOOT EXAM  04/07/2021  . COLONOSCOPY  09/10/2023  . INFLUENZA VACCINE  Completed  . COVID-19 Vaccine  Completed  . Hepatitis C Screening  Completed  . PNA vac Low Risk Adult  Completed   Tdap vaccine- deferred.   Dental Screening: Recommended annual dental exams for proper oral hygiene.  Community Resource Referral / Chronic Care Management: CRR required this visit?  No   CCM required this visit?  No      Plan:   Keep all routine maintenance appointments.   Follow up CCM 08/05/20 @ 1:45  Follow up 11/13/19  I have personally reviewed and noted the following in the patient's chart:   . Medical and social history . Use of alcohol, tobacco or illicit drugs  . Current medications and supplements . Functional ability and status . Nutritional status . Physical activity . Advanced directives . List of other physicians . Hospitalizations, surgeries, and ER visits in previous 12  months . Vitals . Screenings to include cognitive, depression, and falls . Referrals and appointments  In addition, I have reviewed and discussed with patient certain preventive protocols, quality metrics, and best practice recommendations. A written personalized care plan for preventive services as well as general preventive health recommendations were provided to patient via mychart.     Varney Biles, LPN   11/17/1171

## 2020-07-23 NOTE — Telephone Encounter (Signed)
LVM for the patient to return a call back.  Jene Oravec,cma

## 2020-07-27 NOTE — Telephone Encounter (Signed)
I called patient & he wants to check with [facility]. He was not sure if he needed to receive from provider there or of we could just send to [facility] by Mail. I advised that we had done that for many patient's. He just cannot afford through [facility] cost at $600. He will call us back to let us know.

## 2020-08-05 ENCOUNTER — Ambulatory Visit (INDEPENDENT_AMBULATORY_CARE_PROVIDER_SITE_OTHER): Payer: Medicare Other | Admitting: Pharmacist

## 2020-08-05 DIAGNOSIS — E1165 Type 2 diabetes mellitus with hyperglycemia: Secondary | ICD-10-CM

## 2020-08-05 DIAGNOSIS — E119 Type 2 diabetes mellitus without complications: Secondary | ICD-10-CM | POA: Diagnosis not present

## 2020-08-05 DIAGNOSIS — I10 Essential (primary) hypertension: Secondary | ICD-10-CM

## 2020-08-05 DIAGNOSIS — E785 Hyperlipidemia, unspecified: Secondary | ICD-10-CM | POA: Diagnosis not present

## 2020-08-05 NOTE — Chronic Care Management (AMB) (Signed)
Chronic Care Management   Follow Up Note   08/05/2020 Name: Jesus Peterson MRN: [id number] DOB: 1946/09/20  Referred by: [name], MD Reason for referral : Chronic Care Management (Medication Management)   CLEMENT DENEAULT is a 74 y.o. year old male who is a primary care patient of [name], [name], MD. The CCM team was consulted for assistance with chronic disease management and care coordination needs.    Contacted patient for medication management review.  Review of patient status, including review of consultants reports, relevant laboratory and other test results, and collaboration with appropriate care team members and the patient's provider was performed as part of comprehensive patient evaluation and provision of chronic care management services.    SDOH (Social Determinants of Health) assessments performed: Yes See Care Plan activities for detailed interventions related to SDOH)  SDOH Interventions     Most Recent Value  SDOH Interventions  Physical Activity Interventions Intervention Not Indicated       Outpatient Encounter Medications as of 08/05/2020  Medication Sig  . amLODipine (NORVASC) 10 MG tablet TAKE 1 TABLET EVERY DAY  . Bayer Microlet Lancets lancets USE  TWICE DAILY ICD 10 E11.9  . blood glucose meter kit and supplies KIT Dispense based on patient and insurance preference. Check once daily fasting, Dx Code E11.9  . Blood Pressure Monitoring (ADULT BLOOD PRESSURE CUFF LG) KIT Use daily to check blood pressure  . carvedilol (COREG) 12.5 MG tablet Take 12.5 mg by mouth 2 (two) times daily with a meal.  . GARLIC OIL PO Take 1 capsule by mouth 4 (four) times daily.  Marland Kitchen glucose blood (BAYER CONTOUR NEXT TEST) test strip USE  STRIP TO CHECK GLUCOSE TWICE DAILY E11.9  . hydrochlorothiazide (HYDRODIURIL) 12.5 MG tablet TAKE 1 TABLET EVERY DAY  . hydrocortisone 2.5 % cream Apply topically as directed. Qd up to 4 days a week to aa face and under arms prn flares   . ketoconazole (NIZORAL) 2 % cream Apply 1 application topically as directed. Qd to aa rash on face and under arms prn flares  . losartan (COZAAR) 100 MG tablet TAKE 1 TABLET EVERY DAY  . rosuvastatin (CRESTOR) 20 MG tablet TAKE 1 TABLET (20 MG TOTAL) BY MOUTH DAILY.  Marland Kitchen ALPRAZolam (XANAX) 0.5 MG tablet  (Patient not taking: Reported on 08/05/2020)  . clindamycin (CLEOCIN) 300 MG capsule Take 300 mg by mouth as needed.   No facility-administered encounter medications on file as of 08/05/2020.     Objective:   Goals Addressed              This Visit's Progress     Patient Stated   .  "I want to work on my diabetes" (pt-stated)        CARE PLAN ENTRY (see longtitudinal plan of care for additional care plan information)  Current Barriers:  . Social, financial, and community barriers:  o Denies any concerns at this time. Notes that he is unsure the best way to affordability start on Jardiance now that A1c has increased.  . Diabetes: uncontrolled; complicated by chronic medical conditions including HTN, CKD, most recent A1c 7.1% . Most recent eGFR: ~88 mL/min . Current antihyperglycemic regimen: none  o Hx metformin XR; significant nausea preventing use . Current glucose readings:  o Not checking.  . Current meal patterns: o Breakfast: scrambled eggs, bacon; toast, coffee; occasionally sometimes oatmeal w/ raisins;  o Lunch: peanut butter crackers, smaller nmeals;  o Supper: meat w/ vegetables  a few days a week.  o Snacks/desserts: 1 day per week will eat a sweet . Current exercise o Rides bike daily for ~30 minutes, waits until after lunch  . Cardiovascular risk reduction- ED visit for chest pain, work up negative.  o Current hypertensive regimen: amlodipine 10 mg, carvedilol 12.5 mg BID; HCTZ 12.5 mg QAM, losartan 100 mg QAM; home BP readings 120s/60s;  o Current hyperlipidemia regimen: rosuvastatin 20 mg daily; LDL at goal <70 o Current antiplatelet regimen: none; extensive  discussion w/ Dr. Garen Lah regarding whether he should take daily baby aspirin . Vertigo: working w/ Dr. Pryor Ochoa, s/p MRI, carotid artery scan;   Pharmacist Clinical Goal(s):  Marland Kitchen Over the next 90 days, patient will work with PharmD and primary care provider to address optimized medication management  Interventions: . Comprehensive medication review performed, medication list updated in electronic medical record . Inter-disciplinary care team collaboration (see longitudinal plan of care) . Discussed that addition of SGLT2 would be best clinical choice. Patient unsure if he has VA prescription coverage, he knows he has a Advertising account planner. Encouraged to contact his Community Care plan to determine if he has coverage with one of the [place] mail order organizations. Notes that previous investigation of Jardiance w/ his [employer] benefit at local pharmacy was >$600.  Marland Kitchen Reviewed aspirin in primary prevention. Patient has multiple risk factors (DM, HLD, strong family history). Reviewed that we are unsure that the benefit will outweight increased bleed risk.  . Encouraged continued focus on exercise. Target at least 150 minutes weekly.   Patient Self Care Activities:  . Patient will check blood glucose periodically, document, and provide at future appointments . Patient will take medications as prescribed . Patient will report any questions or concerns to provider   Please see past updates related to this goal by clicking on the "Past Updates" button in the selected goal          Plan:  - Will await call back from patient regarding medication access. If I have not heard back from him in 2 weeks, I will reach out.   Catie Darnelle Maffucci, PharmD, [place], CPP Clinical Pharmacist [place] [phone]

## 2020-08-05 NOTE — Patient Instructions (Signed)
Visit Information  Goals Addressed              This Visit's Progress     Patient Stated   .  "I want to work on my diabetes" (pt-stated)        CARE PLAN ENTRY (see longtitudinal plan of care for additional care plan information)  Current Barriers:  . Social, financial, and community barriers:  o Denies any concerns at this time. Notes that he is unsure the best way to affordability start on Jardiance now that A1c has increased.  . Diabetes: uncontrolled; complicated by chronic medical conditions including HTN, CKD, most recent A1c 7.1% . Most recent eGFR: ~88 mL/min . Current antihyperglycemic regimen: none  o Hx metformin XR; significant nausea preventing use . Current glucose readings:  o Not checking.  . Current meal patterns: o Breakfast: scrambled eggs, bacon; toast, coffee; occasionally sometimes oatmeal w/ raisins;  o Lunch: peanut butter crackers, smaller nmeals;  o Supper: meat w/ vegetables a few days a week.  o Snacks/desserts: 1 day per week will eat a sweet . Current exercise o Rides bike daily for ~30 minutes, waits until after lunch  . Cardiovascular risk reduction- ED visit for chest pain, work up negative.  o Current hypertensive regimen: amlodipine 10 mg, carvedilol 12.5 mg BID; HCTZ 12.5 mg QAM, losartan 100 mg QAM; home BP readings 120s/60s;  o Current hyperlipidemia regimen: rosuvastatin 20 mg daily; LDL at goal <70 o Current antiplatelet regimen: none; extensive discussion w/ Dr. Garen Lah regarding whether he should take daily baby aspirin . Vertigo: working w/ Dr. Pryor Ochoa, s/p MRI, carotid artery scan;   Pharmacist Clinical Goal(s):  Marland Kitchen Over the next 90 days, patient will work with PharmD and primary care provider to address optimized medication management  Interventions: . Comprehensive medication review performed, medication list updated in electronic medical record . Inter-disciplinary care team collaboration (see longitudinal plan of  care) . Discussed that addition of SGLT2 would be best clinical choice. Patient unsure if he has VA prescription coverage, he knows he has a Advertising account planner. Encouraged to contact his Community Care plan to determine if he has coverage with one of the [place] mail order organizations. Notes that previous investigation of Jardiance w/ his [employer] benefit at local pharmacy was >$600.  Marland Kitchen Reviewed aspirin in primary prevention. Patient has multiple risk factors (DM, HLD, strong family history). Reviewed that we are unsure that the benefit will outweight increased bleed risk.  . Encouraged continued focus on exercise. Target at least 150 minutes weekly.   Patient Self Care Activities:  . Patient will check blood glucose periodically, document, and provide at future appointments . Patient will take medications as prescribed . Patient will report any questions or concerns to provider   Please see past updates related to this goal by clicking on the "Past Updates" button in the selected goal         The patient verbalized understanding of instructions provided today and declined a print copy of patient instruction materials.   Plan:  - Will await call back from patient regarding medication access. If I have not heard back from him in 2 weeks, I will reach out.   Catie Darnelle Maffucci, PharmD, [place], CPP Clinical Pharmacist [place] [phone]

## 2020-08-06 ENCOUNTER — Telehealth: Payer: Self-pay | Admitting: Pharmacist

## 2020-08-06 ENCOUNTER — Ambulatory Visit: Payer: Medicare Other | Admitting: Pharmacist

## 2020-08-06 DIAGNOSIS — E785 Hyperlipidemia, unspecified: Secondary | ICD-10-CM | POA: Diagnosis not present

## 2020-08-06 DIAGNOSIS — E119 Type 2 diabetes mellitus without complications: Secondary | ICD-10-CM

## 2020-08-06 DIAGNOSIS — E1165 Type 2 diabetes mellitus with hyperglycemia: Secondary | ICD-10-CM

## 2020-08-06 DIAGNOSIS — I10 Essential (primary) hypertension: Secondary | ICD-10-CM | POA: Diagnosis not present

## 2020-08-06 MED ORDER — EMPAGLIFLOZIN 10 MG PO TABS: 10.0000 mg | ORAL_TABLET | Freq: Every day | ORAL | 1 refills | Status: DC

## 2020-08-06 NOTE — Progress Notes (Signed)
Received call back from patient. Discussed that he should contact his [place] and determine if there are prescription benefits (likely through a mail order pharmacy) and if so, let us know where we can send Jardiance.

## 2020-08-06 NOTE — Patient Instructions (Signed)
Visit Information  Goals Addressed              This Visit's Progress     Patient Stated     "I want to work on my diabetes" (pt-stated)        Stamps (see longtitudinal plan of care for additional care plan information)  Current Barriers:   Social, financial, and community barriers:  o Denies any concerns at this time. Notes that he is unsure the best way to affordability start on Jardiance now that A1c has increased. Contacted [facility] benefits - they note that we can e-scribe scripts from external provider to [facility], as patient utilizes [facility] plan  Diabetes: uncontrolled; complicated by chronic medical conditions including HTN, CKD, most recent A1c 7.1%  Most recent eGFR: ~88 mL/min  Current antihyperglycemic regimen: none  o Hx metformin XR; significant nausea preventing use  Current glucose readings:  o Not checking.   Current meal patterns: o Breakfast: scrambled eggs, bacon; toast, coffee; occasionally sometimes oatmeal w/ raisins;  o Lunch: peanut butter crackers, smaller nmeals;  o Supper: meat w/ vegetables a few days a week.  o Snacks/desserts: 1 day per week will eat a sweet  Current exercise o Rides bike daily for ~30 minutes, waits until after lunch   Cardiovascular risk reduction- ED visit for chest pain, work up negative.  o Current hypertensive regimen: amlodipine 10 mg, carvedilol 12.5 mg BID; HCTZ 12.5 mg QAM, losartan 100 mg QAM; home BP readings 120s/60s;  o Current hyperlipidemia regimen: rosuvastatin 20 mg daily; LDL at goal <70 o Current antiplatelet regimen: none; extensive discussion w/ Dr. Garen Lah regarding whether he should take daily baby aspirin  Vertigo: working w/ Dr. Pryor Ochoa, s/p MRI, carotid artery scan;   Pharmacist Clinical Goal(s):   Over the next 90 days, patient will work with PharmD and primary care provider to address optimized medication management  Interventions:  Start Jardiance 10 mg daily. Sent to  [place]. Patient to contact me if cost concerns or access concerns.   Patient Self Care Activities:   Patient will check blood glucose periodically, document, and provide at future appointments  Patient will take medications as prescribed  Patient will report any questions or concerns to provider   Please see past updates related to this goal by clicking on the "Past Updates" button in the selected goal         The patient verbalized understanding of instructions provided today and declined a print copy of patient instruction materials.   Plan:  - Scheduled f/u call in ~ 4 weeks  Catie Darnelle Maffucci, PharmD, [place], [place] Pharmacist [place] [phone]

## 2020-08-06 NOTE — Chronic Care Management (AMB) (Signed)
Chronic Care Management   Follow Up Note   08/06/2020 Name: Jesus Peterson MRN: [id number] DOB: [birth year]  Referred by: [name], MD Reason for referral : Chronic Care Management (Medication Management)   Jesus Peterson is a 74 y.o. year old male who is a primary care patient of [name], [name], MD. The CCM team was consulted for assistance with chronic disease management and care coordination needs.    Contacted patient for medication management review.   Review of patient status, including review of consultants reports, relevant laboratory and other test results, and collaboration with appropriate care team members and the patient's provider was performed as part of comprehensive patient evaluation and provision of chronic care management services.    SDOH (Social Determinants of Health) assessments performed: Yes See Care Plan activities for detailed interventions related to [facility])     Outpatient Encounter Medications as of 08/06/2020  Medication Sig  . ALPRAZolam (XANAX) 0.5 MG tablet  (Patient not taking: Reported on 08/05/2020)  . amLODipine (NORVASC) 10 MG tablet TAKE 1 TABLET EVERY DAY  . Bayer Microlet Lancets lancets USE  TWICE DAILY ICD 10 E11.9  . blood glucose meter kit and supplies KIT Dispense based on patient and insurance preference. Check once daily fasting, Dx Code E11.9  . Blood Pressure Monitoring (ADULT BLOOD PRESSURE CUFF LG) KIT Use daily to check blood pressure  . carvedilol (COREG) 12.5 MG tablet Take 12.5 mg by mouth 2 (two) times daily with a meal.  . clindamycin (CLEOCIN) 300 MG capsule Take 300 mg by mouth as needed.  . empagliflozin (JARDIANCE) 10 MG TABS tablet Take 1 tablet (10 mg total) by mouth daily before breakfast.  . GARLIC OIL PO Take 1 capsule by mouth 4 (four) times daily.  Marland Kitchen glucose blood (BAYER CONTOUR NEXT TEST) test strip USE  STRIP TO CHECK GLUCOSE TWICE DAILY E11.9  . hydrochlorothiazide (HYDRODIURIL) 12.5 MG tablet TAKE 1  TABLET EVERY DAY  . hydrocortisone 2.5 % cream Apply topically as directed. Qd up to 4 days a week to aa face and under arms prn flares  . ketoconazole (NIZORAL) 2 % cream Apply 1 application topically as directed. Qd to aa rash on face and under arms prn flares  . losartan (COZAAR) 100 MG tablet TAKE 1 TABLET EVERY DAY  . rosuvastatin (CRESTOR) 20 MG tablet TAKE 1 TABLET (20 MG TOTAL) BY MOUTH DAILY.   No facility-administered encounter medications on file as of 08/06/2020.     Objective:   Goals Addressed              This Visit's Progress     Patient Stated   .  "I want to work on my diabetes" (pt-stated)        CARE PLAN ENTRY (see longtitudinal plan of care for additional care plan information)  Current Barriers:  . Social, financial, and community barriers:  o Denies any concerns at this time. Notes that he is unsure the best way to affordability start on Jardiance now that A1c has increased. Contacted VA benefits - they note that we can e-scribe scripts from external provider to [facility], as patient utilizes [employer] . Diabetes: uncontrolled; complicated by chronic medical conditions including HTN, CKD, most recent A1c 7.1% . Most recent eGFR: ~88 mL/min . Current antihyperglycemic regimen: none  o Hx metformin XR; significant nausea preventing use . Current glucose readings:  o Not checking.  . Current meal patterns: o Breakfast: scrambled eggs, bacon; toast,  coffee; occasionally sometimes oatmeal w/ raisins;  o Lunch: peanut butter crackers, smaller nmeals;  o Supper: meat w/ vegetables a few days a week.  o Snacks/desserts: 1 day per week will eat a sweet . Current exercise o Rides bike daily for ~30 minutes, waits until after lunch  . Cardiovascular risk reduction- ED visit for chest pain, work up negative.  o Current hypertensive regimen: amlodipine 10 mg, carvedilol 12.5 mg BID; HCTZ 12.5 mg QAM, losartan 100 mg QAM; home BP readings 120s/60s;   o Current hyperlipidemia regimen: rosuvastatin 20 mg daily; LDL at goal <70 o Current antiplatelet regimen: none; extensive discussion w/ Dr. Garen Lah regarding whether he should take daily baby aspirin . Vertigo: working w/ Dr. Pryor Ochoa, s/p MRI, carotid artery scan;   Pharmacist Clinical Goal(s):  Marland Kitchen Over the next 90 days, patient will work with PharmD and primary care provider to address optimized medication management  Interventions: . Start Jardiance 10 mg daily. Sent to [place]. Patient to contact me if cost concerns or access concerns.   Patient Self Care Activities:  . Patient will check blood glucose periodically, document, and provide at future appointments . Patient will take medications as prescribed . Patient will report any questions or concerns to provider   Please see past updates related to this goal by clicking on the "Past Updates" button in the selected goal          Plan:  - Scheduled f/u call in ~ 4 weeks  Catie Darnelle Maffucci, PharmD, [place], [place] Pharmacist [place] [phone]

## 2020-08-10 ENCOUNTER — Other Ambulatory Visit: Payer: Self-pay | Admitting: Family Medicine

## 2020-08-10 DIAGNOSIS — I10 Essential (primary) hypertension: Secondary | ICD-10-CM

## 2020-08-12 ENCOUNTER — Encounter: Payer: Self-pay | Admitting: Family Medicine

## 2020-08-13 ENCOUNTER — Other Ambulatory Visit: Payer: Self-pay | Admitting: Family Medicine

## 2020-08-13 ENCOUNTER — Encounter: Payer: Self-pay | Admitting: Nurse Practitioner

## 2020-08-13 ENCOUNTER — Other Ambulatory Visit: Payer: Self-pay

## 2020-08-13 ENCOUNTER — Ambulatory Visit (INDEPENDENT_AMBULATORY_CARE_PROVIDER_SITE_OTHER): Payer: Medicare Other | Admitting: Nurse Practitioner

## 2020-08-13 ENCOUNTER — Ambulatory Visit (INDEPENDENT_AMBULATORY_CARE_PROVIDER_SITE_OTHER): Payer: Medicare Other

## 2020-08-13 VITALS — BP 130/60 | HR 73 | Temp 98.1°F | Ht 71.0 in | Wt 226.0 lb

## 2020-08-13 DIAGNOSIS — M545 Low back pain, unspecified: Secondary | ICD-10-CM | POA: Insufficient documentation

## 2020-08-13 DIAGNOSIS — E785 Hyperlipidemia, unspecified: Secondary | ICD-10-CM

## 2020-08-13 NOTE — Progress Notes (Signed)
Established Patient Office Visit  Subjective:  Patient ID: Jesus Peterson, male    DOB: [birth year]  Age: 74 y.o. MRN: [id number]  CC:  Chief Complaint  Patient presents with  . Acute Visit    right hip pain    HPI Jesus Peterson is a 74 yo who presents for lower right back pain after a fall on Tues or Wed. He was carrying lumbar out of a building and stepped on a ramp that had a hole in it. He tossed the lumber and got his foot out of the hole- off balance and stumbled across the yard. He knew he was going to lose his balance and fall so he rolled onto the dirt ground onto his left shoulder and side. He got up and was okay. He did notice Rt hip pain. He is not sure why his right hip would hurt. He took Tylenol for the first 2 days. Hot shower helped. He has used Biofreeze on the hip and that helped. It hurts with movement and rising  from a sitting to standing position. Stiffness in lower back. He can walk fine. No pain or weakness on his leg. No limping. It is getting better. He came in for an Xray because his wife is leaving town for 3 weeks and he wants to make sure it is okay.   Past Medical History:  Diagnosis Date  . Actinic keratosis 08/18/2010   Left forearm. Hypertrophic.  . Cancer ([place])    MELANOMA OF SKIN  . Chronic kidney disease   . Dysplastic nevus 07/06/2010   Right back. Moderate atypia.   . Erectile dysfunction   . History of kidney stones   . Hyperlipidemia   . Hypertension   . Melanoma ([place]) 10/17/2017   L infraorbitial cheek, Melanoma IS txted with [facility] Dr. Lacinda Axon, pathology in media  . Seborrheic dermatitis   . Squamous cell carcinoma of skin ?   R hand, txted by Dr. Phillip Heal  . Treadmill stress test negative for angina pectoris 2013   Dr. Nehemiah Massed, normal per pt  . Valvular heart disease   . Venous angioma of brain (HCC)    Pons, found 2/2 tinnitus, followed on MRI    Past Surgical History:  Procedure Laterality Date  . COLONOSCOPY  08/24/2006,08/23/2012   . COLONOSCOPY WITH PROPOFOL N/A 09/09/2018   Procedure: COLONOSCOPY WITH PROPOFOL;  Surgeon: Lollie Sails, MD;  [facility] END[facility]COPY;  Service: Endoscopy;  Laterality: N/A;    Family History  Problem Relation Age of Onset  . Stroke Mother   . Heart disease Mother        s/p CABG  . Heart disease Father   . Heart attack Sister   . Heart disease Brother        s/p CABG  . Heart disease Brother        s/p angioplasty  . Arthritis Brother     Social History   Socioeconomic History  . Marital status: Married    Spouse name: Not on file  . Number of children: Not on file  . Years of education: Not on file  . Highest education level: Not on file  Occupational History  . Not on file  Tobacco Use  . Smoking status: Never Smoker  . Smokeless tobacco: Never Used  Vaping Use  . Vaping Use: Never used  Substance and Sexual Activity  . Alcohol use: No  . Drug use: Never  . Sexual activity: Not Currently  Other  Topics Concern  . Not on file  Social History Narrative   Lives in [place] with wife, 44years. Children - son 82 and daughter 61.   Work - Scientist, research (physical sciences)   Diet - regular   Exercise - limited, walks occas   Social Determinants of Radio broadcast assistant Strain: Low Risk   . Difficulty of Paying Living Expenses: Not hard at all  Food Insecurity: No Food Insecurity  . Worried About Charity fundraiser in the Last Year: Never true  . Ran Out of Food in the Last Year: Never true  Transportation Needs: No Transportation Needs  . Lack of Transportation (Medical): No  . Lack of Transportation (Non-Medical): No  Physical Activity: Sufficiently Active  . Days of Exercise per Week: 7 days  . Minutes of Exercise per Session: 30 min  Stress: No Stress Concern Present  . Feeling of Stress : Not at all  Social Connections: Unknown  . Frequency of Communication with Friends and Family: Not on file  . Frequency of Social Gatherings with Friends and Family: Not on file  .  Attends Religious Services: Not on file  . Active Member of Clubs or Organizations: Not on file  . Attends Archivist Meetings: Not on file  . Marital Status: Married  Human resources officer Violence: Not At Risk  . Fear of Current or Ex-Partner: No  . Emotionally Abused: No  . Physically Abused: No  . Sexually Abused: No    Outpatient Medications Prior to Visit  Medication Sig Dispense Refill  . ALPRAZolam (XANAX) 0.5 MG tablet     . amLODipine (NORVASC) 10 MG tablet TAKE 1 TABLET EVERY DAY 90 tablet 2  . Bayer Microlet Lancets lancets USE  TWICE DAILY ICD 10 E11.9 100 each 11  . blood glucose meter kit and supplies KIT Dispense based on patient and insurance preference. Check once daily fasting, Dx Code E11.9 1 each 0  . Blood Pressure Monitoring (ADULT BLOOD PRESSURE CUFF LG) KIT Use daily to check blood pressure 1 each 0  . carvedilol (COREG) 12.5 MG tablet Take 12.5 mg by mouth 2 (two) times daily with a meal.    . empagliflozin (JARDIANCE) 10 MG TABS tablet Take 1 tablet (10 mg total) by mouth daily before breakfast. 90 tablet 1  . GARLIC OIL PO Take 1 capsule by mouth 4 (four) times daily.    Marland Kitchen glucose blood (BAYER CONTOUR NEXT TEST) test strip USE  STRIP TO CHECK GLUCOSE TWICE DAILY E11.9 100 each 11  . hydrochlorothiazide (HYDRODIURIL) 12.5 MG tablet TAKE 1 TABLET EVERY DAY 90 tablet 1  . hydrocortisone 2.5 % cream Apply topically as directed. Qd up to 4 days a week to aa face and under arms prn flares 180 g 2  . ketoconazole (NIZORAL) 2 % cream Apply 1 application topically as directed. Qd to aa rash on face and under arms prn flares 180 g 3  . losartan (COZAAR) 100 MG tablet TAKE 1 TABLET EVERY DAY 90 tablet 0  . rosuvastatin (CRESTOR) 20 MG tablet TAKE 1 TABLET EVERY DAY 90 tablet 0  . clindamycin (CLEOCIN) 300 MG capsule Take 300 mg by mouth as needed.     No facility-administered medications prior to visit.    Allergies  Allergen Reactions  . Contrast Media  [Iodinated Diagnostic Agents]   . Penicillins     Review of Systems Pertinent positives noted in history of present illness and otherwise negative.   Objective:  Physical Exam Vitals reviewed.  Constitutional:      Appearance: Normal appearance.  Cardiovascular:     Rate and Rhythm: Normal rate and regular rhythm.     Pulses: Normal pulses.     Heart sounds: Normal heart sounds.  Pulmonary:     Effort: Pulmonary effort is normal.     Breath sounds: Normal breath sounds.  Abdominal:     Palpations: Abdomen is soft.     Tenderness: There is no abdominal tenderness.  Musculoskeletal:        General: Normal range of motion.     Cervical back: Normal range of motion and neck supple.     Comments: Bilateral hips are non tender. No tenderness right hip and normal ROM.  Positive tenderness right lumbar para spinous area and L5  lumbar spine. Good back ROM and normal strength, sensation and DTR. Neg SLR. Able to walk on toes, heels, and heel to toe. Gait is normal.   Skin:    General: Skin is warm and dry.  Neurological:     General: No focal deficit present.     Mental Status: He is alert and oriented to person, place, and time.     Sensory: No sensory deficit.     Motor: No weakness.     Coordination: Coordination normal.     Gait: Gait normal.     Deep Tendon Reflexes: Reflexes normal.  Psychiatric:        Mood and Affect: Mood normal.        Behavior: Behavior normal.     BP 130/60 (BP Location: Left Arm, Patient Position: Sitting, Cuff Size: Normal)   Pulse 73   Temp 98.1 F (36.7 C) (Oral)   Ht _0  (1.803 m)   Wt 226 lb (102.5 kg)   SpO2 98%   BMI 31.52 kg/m  Wt Readings from Last 3 Encounters:  08/13/20 226 lb (102.5 kg)  07/23/20 224 lb (101.6 kg)  07/22/20 224 lb 8 oz (101.8 kg)   Pulse Readings from Last 3 Encounters:  08/13/20 73  07/22/20 61  07/12/20 64    BP Readings from Last 3 Encounters:  08/13/20 130/60  07/23/20 120/68  07/22/20 118/68     Lab Results  Component Value Date   CHOL 135 11/06/2019   HDL 38.10 (L) 11/06/2019   LDLCALC 46 08/21/2019   LDLDIRECT 36.0 07/12/2020   TRIG 222.0 (H) 11/06/2019   CHOLHDL 4 11/06/2019      There are no preventive care reminders to display for this patient.  There are no preventive care reminders to display for this patient.  Lab Results  Component Value Date   TSH 3.09 01/23/2014   Lab Results  Component Value Date   WBC 6.5 06/18/2020   HGB 15.5 06/18/2020   HCT 45.7 06/18/2020   MCV 85.9 06/18/2020   PLT 148 (L) 06/18/2020   Lab Results  Component Value Date   NA 140 06/18/2020   K 4.0 06/18/2020   CO2 24 06/18/2020   GLUCOSE 142 (H) 06/18/2020   BUN 25 (H) 06/18/2020   CREATININE 1.02 06/18/2020   BILITOT 0.5 04/06/2020   ALKPHOS 64 04/06/2020   AST 17 04/06/2020   ALT 21 04/06/2020   PROT 6.7 04/06/2020   ALBUMIN 4.2 04/06/2020   CALCIUM 9.0 06/18/2020   ANIONGAP 11 06/18/2020   GFR 61.45 11/06/2019   GFR 61.45 11/06/2019   Lab Results  Component Value Date   CHOL 135 11/06/2019  Lab Results  Component Value Date   HDL 38.10 (L) 11/06/2019   Lab Results  Component Value Date   LDLCALC 46 08/21/2019   Lab Results  Component Value Date   TRIG 222.0 (H) 11/06/2019   Lab Results  Component Value Date   CHOLHDL 4 11/06/2019   Lab Results  Component Value Date   HGBA1C 7.1 (H) 07/12/2020      Assessment & Plan:   Problem List Items Addressed This Visit      Other   Acute right-sided low back pain without sciatica - Primary   Relevant Orders   DG Lumbar Spine Complete      No orders of the defined types were placed in this encounter. Please go to the x-ray today to check your lumbar spine.  Take Tylenol arthritis as directed on the package.  Utilize aheat alternating ice as it feels comfortable.  You may use Biofreeze.  You may also purchase one of the pain patches such as salon pass and use that instead of Biofreeze if  you desire.  Continue to walk, gentle stretching but do not do any heavy lifting or pulling or activity.  Allow this to heal.  If no improvement in 1 week, let us know and I will send you for physical therapy and orthopedic evaluation if needed.  If you develop leg numbness, or weakness, or severe pain, or  inability to hold urine or stool, then seek emergency care.  Addendum:  Please call him with results of the lumbar xray: No acute fracture of lumbar spine.   He does have several chronic findings on his lumbar Xray:  1. Minimal; lumbar spine curve to the right  1. Chronic bilat L5 pars defects- ( like a stress fracture) unchanged from 2013 CT study. 2. He also has DDD - multiple levels and more severe at L5-S1.  3. Anterolisthesis (Slippage) of L5- S1 - unchanged from 2013 Ct study   PLAN:  How is he doing over the weekend with just Tylenol, ice/heat/Biofreeze?  Sometimes, a back brace can help. Continue to walk, gentle stretching but do not do any heavy lifting or pulling or activity. I can refer to Ortho and PT if he desires. Return to the clinic if no improvement.  Follow-up: No follow-ups on file.   This visit occurred during the SARS-CoV-2 public health emergency.  Safety protocols were in place, including screening questions prior to the visit, additional usage of staff PPE, and extensive cleaning of exam room while observing appropriate contact time as indicated for disinfecting solutions.   Denice Paradise, NP

## 2020-08-13 NOTE — Patient Instructions (Addendum)
Please go to the x-ray today to check your lumbar spine.  Take Tylenol arthritis as directed on the package.  Utilize aheat alternating ice as it feels comfortable.  You may use Biofreeze.  You may also purchase one of the pain patches such as salon pass and use that instead of Biofreeze if you desire.  Continue to walk, gentle stretching but do not do any heavy lifting or pulling or activity.  Allow this to heal.  If no improvement in 1 week, let us know and I will send you for physical therapy and orthopedic evaluation if needed.  If you develop leg numbness, or weakness, or severe pain, or  inability to hold urine or stool, then seek emergency care.  Acute Back Pain, Adult Acute back pain is sudden and usually short-lived. It is often caused by an injury to the muscles and tissues in the back. The injury may result from:  A muscle or ligament getting overstretched or torn (strained). Ligaments are tissues that connect bones to each other. Lifting something improperly can cause a back strain.  Wear and tear (degeneration) of the spinal disks. Spinal disks are circular tissue that provides cushioning between the bones of the spine (vertebrae).  Twisting motions, such as while playing sports or doing yard work.  A hit to the back.  Arthritis. You may have a physical exam, lab tests, and imaging tests to find the cause of your pain. Acute back pain usually goes away with rest and home care. Follow these instructions at home: Managing pain, stiffness, and swelling  Take over-the-counter and prescription medicines only as told by your health care provider.  Your health care provider may recommend applying ice during the first 24-48 hours after your pain starts. To do this: ? Put ice in a plastic bag. ? Place a towel between your skin and the bag. ? Leave the ice on for 20 minutes, 2-3 times a day.  If directed, apply heat to the affected area as often as told by your health care  provider. Use the heat source that your health care provider recommends, such as a moist heat pack or a heating pad. ? Place a towel between your skin and the heat source. ? Leave the heat on for 20-30 minutes. ? Remove the heat if your skin turns bright red. This is especially important if you are unable to feel pain, heat, or cold. You have a greater risk of getting burned. Activity   Do not stay in bed. Staying in bed for more than 1-2 days can delay your recovery.  Sit up and stand up straight. Avoid leaning forward when you sit, or hunching over when you stand. ? If you work at a desk, sit close to it so you do not need to lean over. Keep your chin tucked in. Keep your neck drawn back, and keep your elbows bent at a right angle. Your arms should look like the letter "L." ? Sit high and close to the steering wheel when you drive. Add lower back (lumbar) support to your car seat, if needed.  Take short walks on even surfaces as soon as you are able. Try to increase the length of time you walk each day.  Do not sit, drive, or stand in one place for more than 30 minutes at a time. Sitting or standing for long periods of time can put stress on your back.  Do not drive or use heavy machinery while taking prescription pain medicine.  Use proper lifting techniques. When you bend and lift, use positions that put less stress on your back: ? [place] your knees. ? Keep the load close to your body. ? Avoid twisting.  Exercise regularly as told by your health care provider. Exercising helps your back heal faster and helps prevent back injuries by keeping muscles strong and flexible.  Work with a physical therapist to make a safe exercise program, as recommended by your health care provider. Do any exercises as told by your physical therapist. Lifestyle  Maintain a healthy weight. Extra weight puts stress on your back and makes it difficult to have good posture.  Avoid activities or situations that  make you feel anxious or stressed. Stress and anxiety increase muscle tension and can make back pain worse. Learn ways to manage anxiety and stress, such as through exercise. General instructions  Sleep on a firm mattress in a comfortable position. Try lying on your side with your knees slightly bent. If you lie on your back, put a pillow under your knees.  Follow your treatment plan as told by your health care provider. This may include: ? Cognitive or behavioral therapy. ? Acupuncture or massage therapy. ? Meditation or yoga. Contact a health care provider if:  You have pain that is not relieved with rest or medicine.  You have increasing pain going down into your legs or buttocks.  Your pain does not improve after 2 weeks.  You have pain at night.  You lose weight without trying.  You have a fever or chills. Get help right away if:  You develop new bowel or bladder control problems.  You have unusual weakness or numbness in your arms or legs.  You develop nausea or vomiting.  You develop abdominal pain.  You feel faint. Summary  Acute back pain is sudden and usually short-lived.  Use proper lifting techniques. When you bend and lift, use positions that put less stress on your back.  Take over-the-counter and prescription medicines and apply heat or ice as directed by your health care provider. This information is not intended to replace advice given to you by your health care provider. Make sure you discuss any questions you have with your health care provider. Document Revised: 01/21/2019 Document Reviewed: 05/16/2017 Elsevier Patient Education  [place].

## 2020-08-18 ENCOUNTER — Telehealth: Payer: Self-pay | Admitting: Nurse Practitioner

## 2020-08-18 NOTE — Telephone Encounter (Signed)
-----   Message from Filbert Berthold, [place] sent at 08/18/2020  5:07 PM EDT ----- Patient aware of results. Patient did not do well over the weekend but tried his best to manage. Tylenol helps but just not enough. Patient does not want to see ortho or PT yet. I advised patient if he changes his mind to let us know and we can send the referrals in for him.

## 2020-08-18 NOTE — Telephone Encounter (Signed)
I called him and [facility] for him to call back to the office tomorrow. I would like to talk to him about his pain level and location. He may benefit from Mobic and stronger pain medicine. Any radiation into hip or down the leg? Is the pain getting worse?

## 2020-08-19 ENCOUNTER — Other Ambulatory Visit: Payer: Self-pay

## 2020-08-19 ENCOUNTER — Ambulatory Visit (INDEPENDENT_AMBULATORY_CARE_PROVIDER_SITE_OTHER): Payer: Medicare Other

## 2020-08-19 ENCOUNTER — Other Ambulatory Visit: Payer: Medicare Other

## 2020-08-19 DIAGNOSIS — I878 Other specified disorders of veins: Secondary | ICD-10-CM | POA: Diagnosis not present

## 2020-08-19 DIAGNOSIS — M545 Low back pain, unspecified: Secondary | ICD-10-CM | POA: Diagnosis not present

## 2020-08-19 DIAGNOSIS — M549 Dorsalgia, unspecified: Secondary | ICD-10-CM | POA: Diagnosis not present

## 2020-08-19 DIAGNOSIS — M25551 Pain in right hip: Secondary | ICD-10-CM | POA: Diagnosis not present

## 2020-08-19 NOTE — Telephone Encounter (Signed)
He had no hip pain or tenderness at the visit and his focus of pain was higher- therefore a lumbar back Xray was done.   PLAN: Ask him to come in to get a right hip xray today. I will order a stat over read so we have the results today.

## 2020-08-19 NOTE — Telephone Encounter (Signed)
I was gonna call this patient to schedule his xray but he had already walked in for an xray today.

## 2020-08-19 NOTE — Telephone Encounter (Signed)
From talking to him yesterday he was wanting something stronger for pain but didn't think it would be given to him since it wasn't given at the appointment. He tried to mange the best he could over the weekend. Patient thought his hip would be xray too because that's where most of his pain is. He stated this was discussed when he was here for his visit.

## 2020-08-22 IMAGING — CR DG CHEST 2V
2 series · 2 of 2 positions shown · non-contrast
Comparison: 08/13/2009

CLINICAL DATA: Cough for several months

EXAM:
CHEST - 2 VIEW

[chest pa]
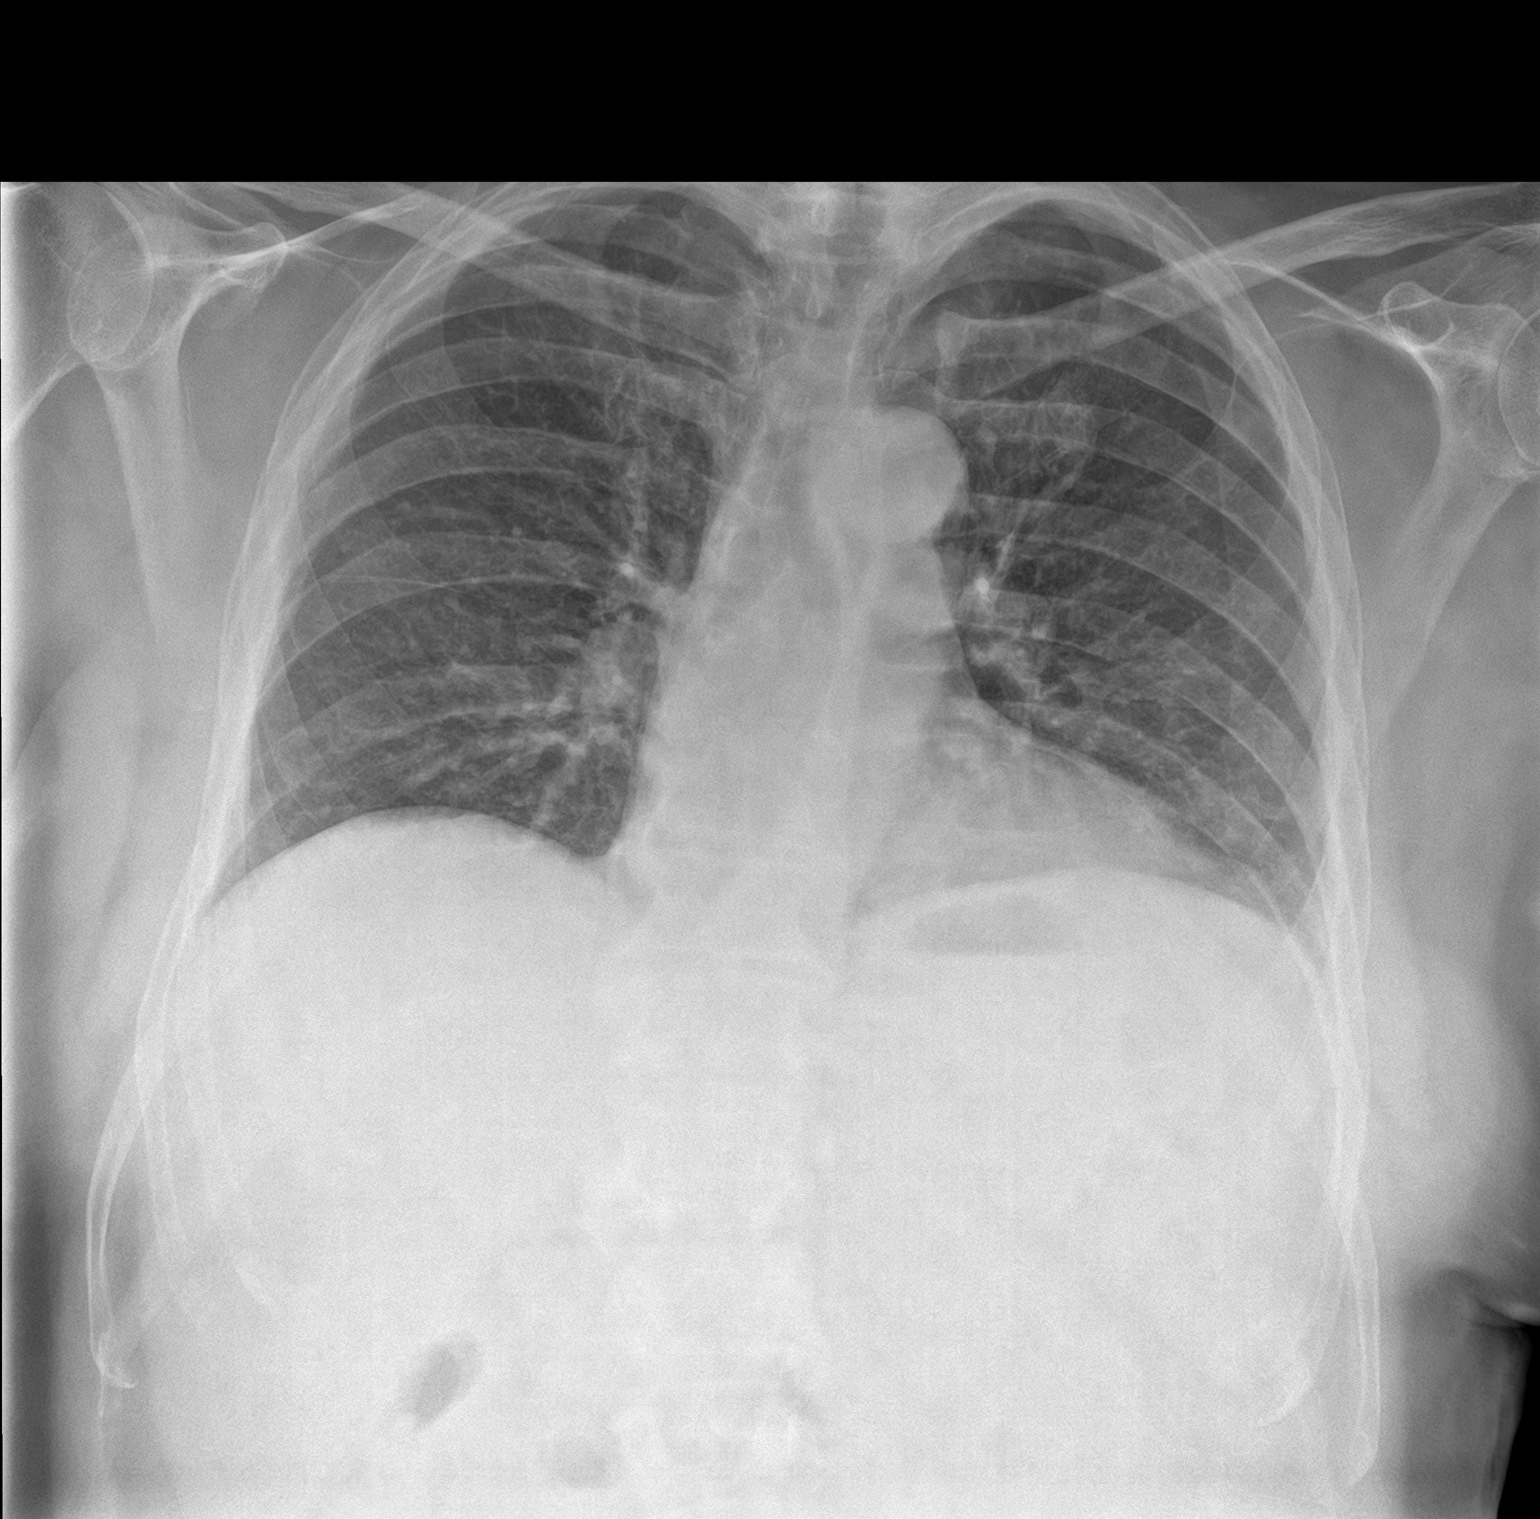

[chest lat]
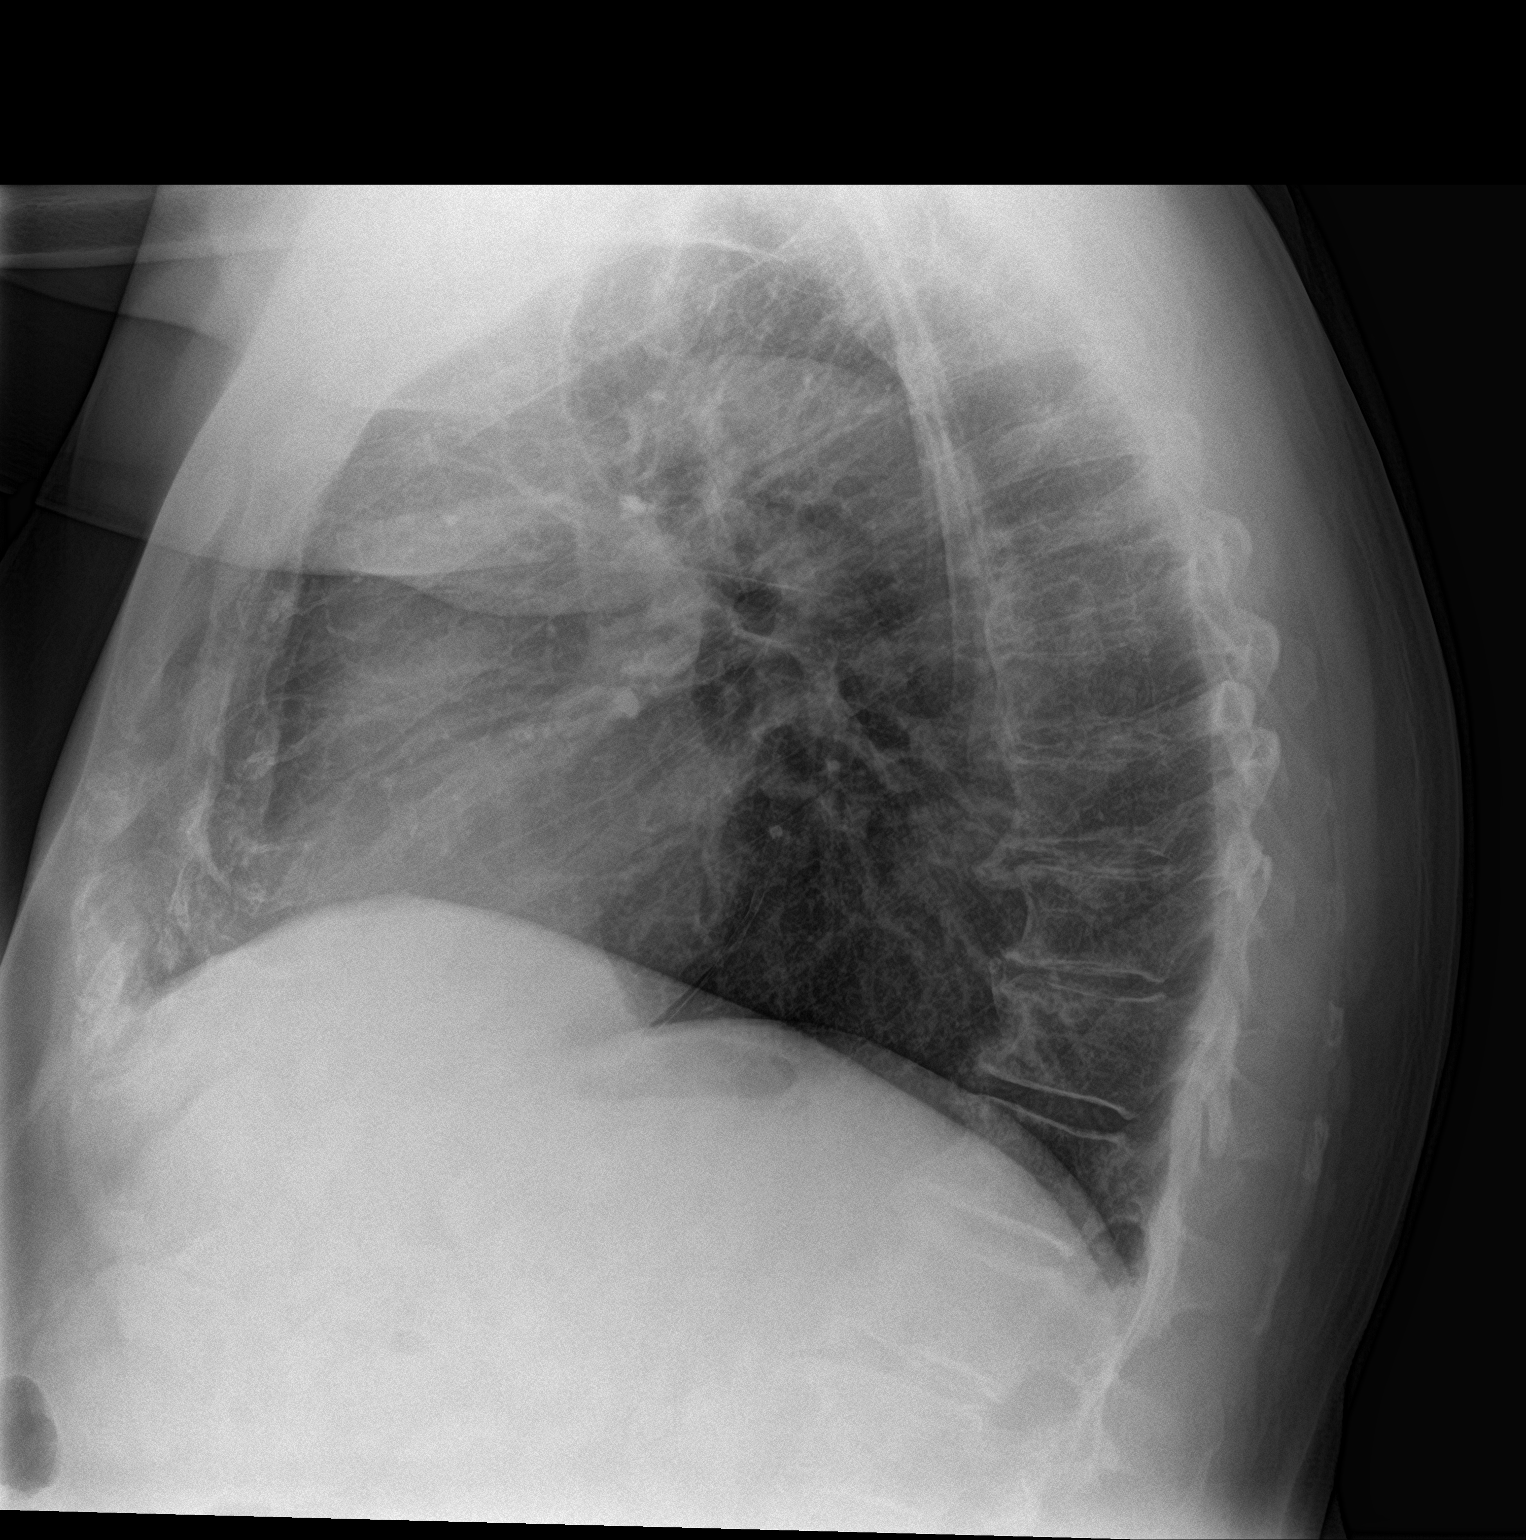

[2 of 2 positions shown; findings below may reference images not displayed]

FINDINGS: The heart size and mediastinal contours are within normal limits.
Both lungs are clear. The visualized skeletal structures are
unremarkable.
IMPRESSION: No active cardiopulmonary disease.

## 2020-09-01 ENCOUNTER — Other Ambulatory Visit: Payer: Self-pay

## 2020-09-01 DIAGNOSIS — I10 Essential (primary) hypertension: Secondary | ICD-10-CM

## 2020-09-01 MED ORDER — LOSARTAN POTASSIUM 100 MG PO TABS: 100.0000 mg | ORAL_TABLET | Freq: Every day | ORAL | 0 refills | Status: DC

## 2020-09-01 MED ORDER — LOSARTAN POTASSIUM 100 MG PO TABS: 100.0000 mg | ORAL_TABLET | Freq: Every day | ORAL | 2 refills | Status: AC

## 2020-09-01 NOTE — Progress Notes (Signed)
Refill for Losartan was sent to [facility].  Breaunna Gottlieb,cma

## 2020-09-03 ENCOUNTER — Ambulatory Visit: Payer: Medicare Other | Admitting: Pharmacist

## 2020-09-03 DIAGNOSIS — I10 Essential (primary) hypertension: Secondary | ICD-10-CM

## 2020-09-03 DIAGNOSIS — E1165 Type 2 diabetes mellitus with hyperglycemia: Secondary | ICD-10-CM

## 2020-09-03 DIAGNOSIS — E785 Hyperlipidemia, unspecified: Secondary | ICD-10-CM

## 2020-09-07 MED ORDER — EMPAGLIFLOZIN 10 MG PO TABS: 10.0000 mg | ORAL_TABLET | Freq: Every day | ORAL | 1 refills | Status: DC

## 2020-09-07 NOTE — Chronic Care Management (AMB) (Signed)
**Note Jesus-Identified via Obfuscation** Chronic Care Management   Pharmacy Note  09/07/2020 Name: Jesus Peterson MRN: [id number] DOB: 1946/06/15   Subjective:  Jesus Peterson is a 74 y.o. year old male who is a primary care patient of Jesus Peterson, Jesus Adam, MD. The CCM team was consulted for assistance with chronic disease management and care coordination needs.    Engaged with patient by telephone for follow up visit in response to provider referral for pharmacy case management and/or care coordination services.   Consent to Services:  [name] was given information about Chronic Care Management services, agreed to services, and gave verbal consent prior to initiation of services on 01/01/20. Please see initial visit note for detailed documentation.   SDOH (Social Determinants of Health) assessments and interventions performed:  SDOH Interventions     Most Recent Value  SDOH Interventions  Financial Strain Interventions Other (Comment)  [medication access navigation,  collaboration w/ [facility]]       Objective:  Lab Results  Component Value Date   CREATININE 1.02 06/18/2020   CREATININE 0.97 04/06/2020   CREATININE 1.16 11/06/2019   CREATININE 1.16 11/06/2019    Lab Results  Component Value Date   HGBA1C 7.1 (H) 07/12/2020       Component Value Date/Time   CHOL 135 11/06/2019 0932   TRIG 222.0 (H) 11/06/2019 0932   HDL 38.10 (L) 11/06/2019 0932   CHOLHDL 4 11/06/2019 0932   VLDL 44.4 (H) 11/06/2019 0932   LDLCALC 46 08/21/2019 0000   LDLDIRECT 36.0 07/12/2020 1007    Clinical ASCVD: No  The 10-year ASCVD risk score Jesus Peterson., et al., 2013) is: 47%   Values used to calculate the score:     Age: 76 years     Sex: Male     Is Non-Hispanic African American: No     Diabetic: Yes     Tobacco smoker: No     Systolic Blood Pressure: 748 mmHg     Is BP treated: Yes     HDL Cholesterol: 38.1 mg/dL     Total Cholesterol: 135 mg/dL     BP Readings from Last 3 Encounters:  08/13/20 130/60  07/23/20 120/68   07/22/20 118/68    Assessment/Interventions: Review of patient past medical history, allergies, medications, health status, including review of consultants reports, laboratory and other test data, was performed as part of comprehensive evaluation and provision of chronic care management services.   Allergies  Allergen Reactions  . Contrast Media [Iodinated Diagnostic Agents]   . Penicillins     Medications Reviewed Today    Reviewed by Jesus Peterson, RPH-CPP (Pharmacist) on 09/03/20 at [phone]  Med List Status: <None>  Medication Order Taking? Sig Documenting Provider Last Dose Status Informant  ALPRAZolam (XANAX) 0.5 MG tablet [id number] Yes  [provider] Taking Active   amLODipine (NORVASC) 10 MG tablet [id number] Yes TAKE 1 TABLET EVERY DAY Jesus Haven, MD Taking Active   Bayer Microlet Lancets lancets [id number] Yes USE  TWICE DAILY ICD 10 E11.9 Jesus Haven, MD Taking Active   blood glucose meter kit and supplies KIT [id number] Yes Dispense based on patient and insurance preference. Check once daily fasting, Dx Code E11.9 Jesus Haven, MD Taking Active   Blood Pressure Monitoring (ADULT BLOOD PRESSURE CUFF LG) KIT [id number] Yes Use daily to check blood pressure Jesus Haven, MD Taking Active   carvedilol (COREG) 12.5 MG tablet [id number] Yes Take 12.5 mg by mouth 2 (two) times daily with a  meal. [provider] Taking Active   empagliflozin (JARDIANCE) 10 MG TABS tablet [id number] No Take 1 tablet (10 mg total) by mouth daily before breakfast.  Patient not taking: Reported on 09/03/2020   Jesus Haven, MD Not Taking Active   GARLIC OIL PO [id number] Yes Take 1 capsule by mouth 4 (four) times daily. [provider] Taking Active   glucose blood (BAYER CONTOUR NEXT TEST) test strip [id number] Yes USE  STRIP TO CHECK GLUCOSE TWICE DAILY E11.9 Jesus Haven, MD Taking Active   hydrochlorothiazide (HYDRODIURIL) 12.5 MG tablet  [id number] Yes TAKE 1 TABLET EVERY DAY Jesus Haven, MD Taking Active   hydrocortisone 2.5 % cream [id number]  Apply topically as directed. Qd up to 4 days a week to aa face and under arms prn flares Jesus Patty, MD  Active   ketoconazole (NIZORAL) 2 % cream [id number]  Apply 1 application topically as directed. Qd to aa rash on face and under arms prn flares Jesus Patty, MD  Active   losartan (COZAAR) 100 MG tablet [id number] Yes Take 1 tablet (100 mg total) by mouth daily. Jesus Haven, MD Taking Active   rosuvastatin (CRESTOR) 20 MG tablet [id number] Yes TAKE 1 TABLET EVERY DAY Jesus Haven, MD Taking Active           Patient Active Problem List   Diagnosis Date Noted  . Acute right-sided low back pain without sciatica 08/13/2020  . Atypical chest pain 07/12/2020  . Dizziness 04/07/2020  . Chronic kidney disease 10/08/2019  . Eczema 09/23/2019  . Chronic cough 07/23/2019  . Skin infection 07/23/2019  . Seborrheic dermatitis 07/22/2018  . Prostate cancer screening 04/08/2018  . Melanoma in situ of cheek ([place]) 12/24/2017  . History of colon polyps 12/24/2017  . History of malignant melanoma of skin 12/11/2017  . Right upper quadrant abdominal pain 09/21/2017  . Toenail deformity 06/26/2017  . Hearing difficulty of both ears 03/26/2017  . Left foot pain 02/05/2017  . Diarrhea 02/05/2017  . Neck pain 10/18/2016  . Osteoarthritis of left hand 06/30/2016  . Angioma 06/30/2016  . Obstructive sleep apnea 07/03/2014  . Valvular heart disease 05/11/2014  . Nonspecific abnormal electrocardiogram (ECG) (EKG) 02/06/2014  . Diabetes type 2, controlled ([place]) 08/11/2013  . GERD (gastroesophageal reflux disease) 04/05/2012  . Hypertension 03/04/2012  . Erectile dysfunction 03/04/2012  . Hyperlipidemia 03/04/2012    Medication Assistance: working on [employer] attainment from [place]: Medication Management    Problem Identified: Diabetes,  Hypertension     Long-Range Goal: Disease Management   Start Date: 09/03/2020  This Visit's Progress: Not on track  Priority: High  Note:   Current Barriers:  . Unable to independently afford treatment regimen . Unable to achieve control of diabetes   Pharmacist Clinical Goal(s):  Marland Kitchen Over the next 90 days, patient will achieve control of diabetes as evidenced by improvement in A1c through collaboration with PharmD and provider  Interventions: . Inter-disciplinary care team collaboration (see longitudinal plan of care) . Comprehensive medication review performed; medication list updated in electronic medical record  Diabetes: . Uncontrolled; current treatment: none - prescribed Jardiance 10 mg daily, but was too expensive on [employer]. Sent to [place] per patient request, but he notes he has never received anything. He called the [place], they note they never received the script that I electronically sent o Hx metformin XR, significant GI upset . Current glucose readings: fasting glucose: 140s . Denies/reports hypoglycemic/hyperglycemic  symptoms . Current exercise: rides bike daily for ~ 30 mintues . Current meal patterns: Breakfast: scrambled eggs, bacon; toast, coffee; occasionally sometimes oatmeal w/ raisins; Lunch: peanut butter crackers, smaller meals; Supper: meat w/ vegetables a few days a week; Snacks/desserts: 1 day per week will eat a sweet . Faxed script for Jardiance 10 mg daily to [facility] twice. Called to confirm receipt, but they would not speak to me as I wasn't the patient. Patient called, they deny having received fax. He will pick up the prescription to mail to the [place].  Hypertension: . Controlled; current treatment: amlodipine 10 mg, carvedilol 12.5 mg BID; HCTZ 12.5 mg QAM, losartan 100 mg QAM . Current home readings: 120s/60s . Recommended to continue current regimen  Hyperlipidemia: . Controlled; current treatment: rosuvastatin 20 mg  daily . Recommended to continue current regimen  Patient Goals/Self-Care Activities . Over the next 90 days, patient will:  - take medications as prescribed check blood glucose daily, document, and provide at future appointments  Follow Up Plan: Telephone follow up appointment with care management team member scheduled for:~6 weeks        Plan: Telephone follow up appointment with care management team member scheduled for:~ 6 weeks  Catie Darnelle Maffucci, PharmD, [place], CPP Clinical Pharmacist [place] [phone]

## 2020-09-07 NOTE — Patient Instructions (Addendum)
Visit Information  Patient Care Plan: Medication Management    Problem Identified: Diabetes, Hypertension     Long-Range Goal: Disease Management   Start Date: 09/03/2020  This Visit's Progress: Not on track  Priority: High  Note:   Current Barriers:  . Unable to independently afford treatment regimen . Unable to achieve control of diabetes   Pharmacist Clinical Goal(s):  Marland Kitchen Over the next 90 days, patient will achieve control of diabetes as evidenced by improvement in A1c through collaboration with PharmD and provider  Interventions: . Inter-disciplinary care team collaboration (see longitudinal plan of care) . Comprehensive medication review performed; medication list updated in electronic medical record  Diabetes: . Uncontrolled; current treatment: none - prescribed Jardiance 10 mg daily, but was too expensive on [employer]. Sent to [place] per patient request, but he notes he has never received anything. He called the [place], they note they never received the script that I electronically sent o Hx metformin XR, significant GI upset . Current glucose readings: fasting glucose: 140s . Denies/reports hypoglycemic/hyperglycemic symptoms . Current exercise: rides bike daily for ~ 30 mintues . Current meal patterns: Breakfast: scrambled eggs, bacon; toast, coffee; occasionally sometimes oatmeal w/ raisins; Lunch: peanut butter crackers, smaller meals; Supper: meat w/ vegetables a few days a week; Snacks/desserts: 1 day per week will eat a sweet . Faxed script for Jardiance 10 mg daily to [facility] twice. Called to confirm receipt, but they would not speak to me as I wasn't the patient. Patient called, they deny having received fax. He will pick up the prescription to mail to the [place].  Hypertension: . Controlled; current treatment: amlodipine 10 mg, carvedilol 12.5 mg BID; HCTZ 12.5 mg QAM, losartan 100 mg QAM . Current home readings: 120s/60s . Recommended to continue current  regimen  Hyperlipidemia: . Controlled; current treatment: rosuvastatin 20 mg daily . Recommended to continue current regimen  Patient Goals/Self-Care Activities . Over the next 90 days, patient will:  - take medications as prescribed check blood glucose daily, document, and provide at future appointments  Follow Up Plan: Telephone follow up appointment with care management team member scheduled for:~6 weeks       The patient verbalized understanding of instructions, educational materials, and care plan provided today and declined offer to receive copy of patient instructions, educational materials, and care plan.    Plan: Telephone follow up appointment with care management team member scheduled for:~ 6 weeks  Catie Darnelle Maffucci, PharmD, [place], [place] Pharmacist [place] [phone]

## 2020-09-16 ENCOUNTER — Telehealth: Payer: Self-pay | Admitting: Pharmacist

## 2020-09-16 ENCOUNTER — Telehealth: Payer: No Typology Code available for payment source

## 2020-09-16 NOTE — Telephone Encounter (Signed)
°  Chronic Care Management   Note  09/16/2020 Name: MICHIEL SIVLEY MRN: [id number] DOB: April 09, 1946   Patient called back. Noted that before the Jardiance can be filled at the [place], he needs to see a [place] provider. That appointment is scheduled for 10/26/20.   Rescheduled follow up for the week after that appointment.   Catie Darnelle Maffucci, PharmD, [place], CPP Clinical Pharmacist [place] [phone]

## 2020-09-23 DIAGNOSIS — C44629 Squamous cell carcinoma of skin of left upper limb, including shoulder: Secondary | ICD-10-CM | POA: Diagnosis not present

## 2020-09-23 DIAGNOSIS — Z23 Encounter for immunization: Secondary | ICD-10-CM | POA: Diagnosis not present

## 2020-09-23 DIAGNOSIS — D1801 Hemangioma of skin and subcutaneous tissue: Secondary | ICD-10-CM | POA: Diagnosis not present

## 2020-09-23 DIAGNOSIS — L821 Other seborrheic keratosis: Secondary | ICD-10-CM | POA: Diagnosis not present

## 2020-09-23 DIAGNOSIS — C4339 Malignant melanoma of other parts of face: Secondary | ICD-10-CM | POA: Diagnosis not present

## 2020-09-23 DIAGNOSIS — D225 Melanocytic nevi of trunk: Secondary | ICD-10-CM | POA: Diagnosis not present

## 2020-09-23 DIAGNOSIS — L570 Actinic keratosis: Secondary | ICD-10-CM | POA: Diagnosis not present

## 2020-10-11 ENCOUNTER — Other Ambulatory Visit: Payer: Self-pay | Admitting: Family Medicine

## 2020-10-11 DIAGNOSIS — E785 Hyperlipidemia, unspecified: Secondary | ICD-10-CM

## 2020-10-26 ENCOUNTER — Telehealth: Payer: Self-pay | Admitting: Pharmacist

## 2020-10-26 NOTE — Telephone Encounter (Addendum)
  Chronic Care Management   Note  10/26/2020 Name: Jesus Peterson MRN: [id number] DOB: 1946-05-16   Patient calls, LVM reporting that [facility] support and are going to send out Jardiance 10 mg daily.   Will follow up as previously scheduled.   Catie Darnelle Maffucci, PharmD, [place], [place] Clinical Pharmacist [employer] at [place]

## 2020-11-03 ENCOUNTER — Telehealth: Payer: Self-pay | Admitting: Pharmacist

## 2020-11-03 ENCOUNTER — Telehealth: Payer: No Typology Code available for payment source

## 2020-11-03 NOTE — Telephone Encounter (Signed)
  Chronic Care Management   Note  11/03/2020 Name: VUK SKILLERN MRN: [id number] DOB: 03-Jul-1946   Attempted to contact patient for scheduled appointment for medication management support. Left HIPAA compliant message for patient to return my call at their convenience. Received message from patient last week that he was getting ready to receive Jardiance from the [place].   If patient does not return my call today, will collaborate w/ Care Guide to r/s appointment for late February. Patient has f/u with PCP in ~ 2 weeks.   Catie Darnelle Maffucci, PharmD, [place], [place] Clinical Pharmacist [employer] at [place]

## 2020-11-08 NOTE — Telephone Encounter (Signed)
Patient has been rescheduled.

## 2020-11-12 ENCOUNTER — Ambulatory Visit: Payer: Medicare Other | Admitting: Family Medicine

## 2020-11-16 ENCOUNTER — Other Ambulatory Visit: Payer: Self-pay | Admitting: Dermatology

## 2020-11-16 ENCOUNTER — Other Ambulatory Visit: Payer: Self-pay

## 2020-11-16 ENCOUNTER — Other Ambulatory Visit: Payer: Self-pay | Admitting: Family Medicine

## 2020-11-16 DIAGNOSIS — I10 Essential (primary) hypertension: Secondary | ICD-10-CM

## 2020-11-16 DIAGNOSIS — L219 Seborrheic dermatitis, unspecified: Secondary | ICD-10-CM

## 2020-12-01 ENCOUNTER — Other Ambulatory Visit: Payer: Self-pay

## 2020-12-02 ENCOUNTER — Ambulatory Visit (INDEPENDENT_AMBULATORY_CARE_PROVIDER_SITE_OTHER): Payer: No Typology Code available for payment source | Admitting: Family Medicine

## 2020-12-02 ENCOUNTER — Other Ambulatory Visit: Payer: Self-pay

## 2020-12-02 ENCOUNTER — Encounter: Payer: Self-pay | Admitting: Family Medicine

## 2020-12-02 DIAGNOSIS — E785 Hyperlipidemia, unspecified: Secondary | ICD-10-CM | POA: Diagnosis not present

## 2020-12-02 DIAGNOSIS — I10 Essential (primary) hypertension: Secondary | ICD-10-CM | POA: Diagnosis not present

## 2020-12-02 DIAGNOSIS — E119 Type 2 diabetes mellitus without complications: Secondary | ICD-10-CM

## 2020-12-02 LAB — LIPID PANEL
Cholesterol: 99 mg/dL (ref 0–200)
HDL: 40.2 mg/dL (ref >39.00)
LDL Cholesterol: 24 mg/dL (ref 0–99)
NonHDL: 58.73
Total CHOL/HDL Ratio: 2
Triglycerides: 172 mg/dL — ABNORMAL HIGH (ref 0.0–149.0)
VLDL: 34.4 mg/dL (ref 0.0–40.0)

## 2020-12-02 LAB — COMPREHENSIVE METABOLIC PANEL
ALT: 24 U/L (ref 0–53)
AST: 16 U/L (ref 0–37)
Albumin: 4.5 g/dL (ref 3.5–5.2)
Alkaline Phosphatase: 56 U/L (ref 39–117)
BUN: 28 mg/dL — ABNORMAL HIGH (ref 6–23)
CO2: 27 mEq/L (ref 19–32)
Calcium: 9.3 mg/dL (ref 8.4–10.5)
Chloride: 103 mEq/L (ref 96–112)
Creatinine, Ser: 1.11 mg/dL (ref 0.40–1.50)
GFR: 64.89 mL/min (ref >60.00)
Glucose, Bld: 127 mg/dL — ABNORMAL HIGH (ref 70–99)
Potassium: 3.9 mEq/L (ref 3.5–5.1)
Sodium: 137 mEq/L (ref 135–145)
Total Bilirubin: 1 mg/dL (ref 0.2–1.2)
Total Protein: 6.9 g/dL (ref 6.0–8.3)

## 2020-12-02 LAB — HEMOGLOBIN A1C: Hgb A1c MFr Bld: 6.8 % — ABNORMAL HIGH (ref 4.6–6.5)

## 2020-12-02 NOTE — Patient Instructions (Signed)
Nice to see you. We will get lab work today.  

## 2020-12-02 NOTE — Progress Notes (Signed)
Jesus Rumps, MD Phone: [phone]  Jesus Peterson is a 75 y.o. male who presents today for f/u.  HYPERTENSION  Disease Monitoring  Home BP Monitoring 120/80 Chest pain- no    Dyspnea- no Medications  Compliance-  Taking amlodipine, coreg, HCTZ, losartan.  Edema- no  DIABETES Disease Monitoring: Blood Sugar ranges-148-150 since starting on jardiance Polyuria/phagia/dipsia- no      Optho- UTD Medications: Compliance- taking jardiance 10 mg daily for the past month Hypoglycemic symptoms- no     Social History   Tobacco Use  Smoking Status Never Smoker  Smokeless Tobacco Never Used    Current Outpatient Medications on File Prior to Visit  Medication Sig Dispense Refill  . ALPRAZolam (XANAX) 0.5 MG tablet     . amLODipine (NORVASC) 10 MG tablet TAKE 1 TABLET EVERY DAY 90 tablet 2  . aspirin 81 MG chewable tablet CHEW ONE TABLET BY MOUTH    . Bayer Microlet Lancets lancets USE  TWICE DAILY ICD 10 E11.9 100 each 11  . blood glucose meter kit and supplies KIT Dispense based on patient and insurance preference. Check once daily fasting, Dx Code E11.9 1 each 0  . Blood Pressure Monitoring (ADULT BLOOD PRESSURE CUFF LG) KIT Use daily to check blood pressure 1 each 0  . carvedilol (COREG) 12.5 MG tablet Take 12.5 mg by mouth 2 (two) times daily with a meal.    . empagliflozin (JARDIANCE) 10 MG TABS tablet Take 1 tablet (10 mg total) by mouth daily before breakfast. 90 tablet 1  . esomeprazole (NEXIUM) 20 MG capsule TAKE 2 CAPSULES BY MOUTH    . GARLIC OIL PO Take 1 capsule by mouth 4 (four) times daily.    Marland Kitchen glucose blood (BAYER CONTOUR NEXT TEST) test strip USE  STRIP TO CHECK GLUCOSE TWICE DAILY E11.9 100 each 11  . hydrochlorothiazide (HYDRODIURIL) 12.5 MG tablet TAKE 1 TABLET EVERY DAY 90 tablet 1  . hydrocortisone 2.5 % cream APPLY TOPICALLY AS DIRECTED DAILY UP TO 4 DAYS PER WEEK TO AFFECTED AREA(S) OF FACE AND UNDER ARMS AS NEEDED FOR FLARES 84 g 2  . losartan (COZAAR)  100 MG tablet Take 1 tablet (100 mg total) by mouth daily. 90 tablet 2  . rosuvastatin (CRESTOR) 20 MG tablet TAKE 1 TABLET EVERY DAY 90 tablet 0   No current facility-administered medications on file prior to visit.     ROS see history of present illness  Objective  Physical Exam Vitals:   12/02/20 0909  BP: 115/70  Pulse: 60  Temp: 98 F (36.7 C)  SpO2: 98%    BP Readings from Last 3 Encounters:  12/02/20 115/70  08/13/20 130/60  07/23/20 120/68   Wt Readings from Last 3 Encounters:  12/02/20 228 lb 3.2 oz (103.5 kg)  08/13/20 226 lb (102.5 kg)  07/23/20 224 lb (101.6 kg)    Physical Exam Constitutional:      General: He is not in acute distress.    Appearance: He is not diaphoretic.  Cardiovascular:     Rate and Rhythm: Normal rate and regular rhythm.     Heart sounds: Normal heart sounds.  Pulmonary:     Effort: Pulmonary effort is normal.     Breath sounds: Normal breath sounds.  Musculoskeletal:        General: No edema.     Right lower leg: No edema.     Left lower leg: No edema.  Skin:    General: Skin is warm and dry.  Neurological:     Mental Status: He is alert.      Assessment/Plan: Please see individual problem list.  Problem List Items Addressed This Visit    Diabetes type 2, controlled ([place])    Improving control on Jardiance.  He will continue Jardiance 10 mg once daily.  Check A1c.      Relevant Medications   aspirin 81 MG chewable tablet   Other Relevant Orders   HgB A1c   Hyperlipidemia    Check lipid panel.      Relevant Medications   aspirin 81 MG chewable tablet   Other Relevant Orders   Lipid panel   Hypertension    Adequately controlled.  Patient will continue amlodipine 10 mg once daily, carvedilol 12.5 mg twice daily, HCTZ 12.5 mg daily, and losartan 100 mg daily.  Check labs.      Relevant Medications   aspirin 81 MG chewable tablet   Other Relevant Orders   Comp Met (CMET)      This visit occurred during  the SARS-CoV-2 public health emergency.  Safety protocols were in place, including screening questions prior to the visit, additional usage of staff PPE, and extensive cleaning of exam room while observing appropriate contact time as indicated for disinfecting solutions.    Jesus Rumps, MD [place]

## 2020-12-02 NOTE — Assessment & Plan Note (Signed)
Check lipid panel  

## 2020-12-02 NOTE — Assessment & Plan Note (Signed)
Adequately controlled.  Patient will continue amlodipine 10 mg once daily, carvedilol 12.5 mg twice daily, HCTZ 12.5 mg daily, and losartan 100 mg daily.  Check labs.

## 2020-12-02 NOTE — Assessment & Plan Note (Signed)
Improving control on Jardiance.  He will continue Jardiance 10 mg once daily.  Check A1c.

## 2020-12-03 ENCOUNTER — Telehealth: Payer: Self-pay | Admitting: Family Medicine

## 2020-12-03 NOTE — Telephone Encounter (Signed)
[facility] called they need pt last office note, medication list and labs faxed to them at [phone]

## 2020-12-03 NOTE — Telephone Encounter (Signed)
Med list, labs and last OV note was faxed to [facility] and confirmation was given.  Issac Moure,cma

## 2020-12-20 ENCOUNTER — Ambulatory Visit (INDEPENDENT_AMBULATORY_CARE_PROVIDER_SITE_OTHER): Payer: No Typology Code available for payment source | Admitting: Dermatology

## 2020-12-20 ENCOUNTER — Other Ambulatory Visit: Payer: Self-pay

## 2020-12-20 ENCOUNTER — Encounter: Payer: Self-pay | Admitting: Dermatology

## 2020-12-20 DIAGNOSIS — L853 Xerosis cutis: Secondary | ICD-10-CM

## 2020-12-20 DIAGNOSIS — L720 Epidermal cyst: Secondary | ICD-10-CM | POA: Diagnosis not present

## 2020-12-20 DIAGNOSIS — D485 Neoplasm of uncertain behavior of skin: Secondary | ICD-10-CM

## 2020-12-20 DIAGNOSIS — L821 Other seborrheic keratosis: Secondary | ICD-10-CM

## 2020-12-20 DIAGNOSIS — L219 Seborrheic dermatitis, unspecified: Secondary | ICD-10-CM | POA: Diagnosis not present

## 2020-12-20 DIAGNOSIS — Q825 Congenital non-neoplastic nevus: Secondary | ICD-10-CM | POA: Diagnosis not present

## 2020-12-20 DIAGNOSIS — L820 Inflamed seborrheic keratosis: Secondary | ICD-10-CM | POA: Diagnosis not present

## 2020-12-20 DIAGNOSIS — Z86006 Personal history of melanoma in-situ: Secondary | ICD-10-CM | POA: Diagnosis not present

## 2020-12-20 DIAGNOSIS — L814 Other melanin hyperpigmentation: Secondary | ICD-10-CM | POA: Diagnosis not present

## 2020-12-20 DIAGNOSIS — L578 Other skin changes due to chronic exposure to nonionizing radiation: Secondary | ICD-10-CM

## 2020-12-20 DIAGNOSIS — Z85828 Personal history of other malignant neoplasm of skin: Secondary | ICD-10-CM

## 2020-12-20 DIAGNOSIS — Z1283 Encounter for screening for malignant neoplasm of skin: Secondary | ICD-10-CM | POA: Diagnosis not present

## 2020-12-20 DIAGNOSIS — D492 Neoplasm of unspecified behavior of bone, soft tissue, and skin: Secondary | ICD-10-CM

## 2020-12-20 DIAGNOSIS — D2372 Other benign neoplasm of skin of left lower limb, including hip: Secondary | ICD-10-CM | POA: Diagnosis not present

## 2020-12-20 DIAGNOSIS — Z86018 Personal history of other benign neoplasm: Secondary | ICD-10-CM | POA: Diagnosis not present

## 2020-12-20 DIAGNOSIS — D229 Melanocytic nevi, unspecified: Secondary | ICD-10-CM | POA: Diagnosis not present

## 2020-12-20 DIAGNOSIS — D1800 Hemangioma unspecified site: Secondary | ICD-10-CM | POA: Diagnosis not present

## 2020-12-20 DIAGNOSIS — D1801 Hemangioma of skin and subcutaneous tissue: Secondary | ICD-10-CM | POA: Diagnosis not present

## 2020-12-20 MED ORDER — KETOCONAZOLE 2 % EX CREA: 1.0000 "application " | TOPICAL_CREAM | Freq: Every day | CUTANEOUS | 4 refills | Status: AC

## 2020-12-20 NOTE — Patient Instructions (Signed)

## 2020-12-20 NOTE — Progress Notes (Signed)
Follow-Up Visit   Subjective  Jesus Peterson is a 75 y.o. male who presents for the following: Upper body skin exam (Hx of Melanoma IS L infraorbital cheek, Hx of SCC R hand, hx of Dysplastic nevus R back, hx of AKs). Has spot under jaw that he hits when shaving.  New bump on L cheek in scar.  He also has a new dark spot on his knee.  Couple scaly growths on his elbow and scalp that he picks at.  The following portions of the chart were reviewed this encounter and updated as appropriate:       Review of Systems:  No other skin or systemic complaints except as noted in HPI or Assessment and Plan.  Objective  Well appearing patient in no apparent distress; mood and affect are within normal limits.  All skin waist up examined.  Objective  Left nasal labial fold: 4.54mm pink pearly pap     Objective  R inferior jaw: 3.62mm violaceous pap     Objective  R post shoulder: 6.35mm brow macule with irregular pigment slightly waxy     Objective  L medial knee: 5.8mm brown firm nodule     Objective  Right Hand - Posterior: Well healed scar with no evidence of recurrence   Objective  face, ears: Pink scaliness face, ears, eyebrows Axilla clear  Objective  Right back: Scar with no evidence of recurrence.   Objective  L infraorbital cheek: Scar clear to visual exam and palpation  Objective  Right Elbow x 1, R occipital scalp x 1 (2): Erythematous keratotic or waxy stuck-on papule or plaque.   Objective  bil lower legs: Xerosis with ichthyotic scale  Objective  Right Thigh - Posterior: Speckled brown plaque with hypertrichosis- no changes per pt   Assessment & Plan    Lentigines - Scattered tan macules - Due to sun exposure - Benign-appering, observe - Recommend daily broad spectrum sunscreen SPF 30+ to sun-exposed areas, reapply every 2 hours as needed. - Call for any changes  Seborrheic Keratoses - Stuck-on, waxy, tan-brown papules and plaques   - Discussed benign etiology and prognosis. - Observe - Call for any changes  Melanocytic Nevi - Tan-brown and/or pink-flesh-colored symmetric macules and papules - Benign appearing on exam today - Observation - Call clinic for new or changing moles - Recommend daily use of broad spectrum spf 30+ sunscreen to sun-exposed areas.   Hemangiomas - Red papules - Discussed benign nature - Observe - Call for any changes  Actinic Damage - Chronic, secondary to cumulative UV/sun exposure - diffuse scaly erythematous macules with underlying dyspigmentation - Recommend daily broad spectrum sunscreen SPF 30+ to sun-exposed areas, reapply every 2 hours as needed.  - Call for new or changing lesions.  Skin cancer screening performed today.   Neoplasm of skin (4) Left nasal labial fold  Skin / nail biopsy Type of biopsy: tangential   Informed consent: discussed and consent obtained   Timeout: patient name, date of birth, surgical site, and procedure verified   Procedure prep:  Patient was prepped and draped in usual sterile fashion Prep type:  Isopropyl alcohol Anesthesia: the lesion was anesthetized in a standard fashion   Anesthetic:  1% lidocaine w/ epinephrine 1-100,000 buffered w/ 8.4% NaHCO3 Instrument used: flexible razor blade   Hemostasis achieved with: pressure, aluminum chloride and electrodesiccation   Outcome: patient tolerated procedure well   Post-procedure details: wound care instructions given   Additional details:  Mupirocin ointment and Bandaid  applied    Specimen 1 - Surgical pathology Differential Diagnosis: D48.5 Cyst r/o BCC  Check Margins: yes 4.16mm pink pearly pap  R inferior jaw  Skin / nail biopsy Type of biopsy: tangential   Informed consent: discussed and consent obtained   Anesthesia: the lesion was anesthetized in a standard fashion   Anesthesia comment:  Area prepped with alcohol Anesthetic:  1% lidocaine w/ epinephrine 1-100,000 buffered w/  8.4% NaHCO3 Instrument used: flexible razor blade   Hemostasis achieved with: pressure, aluminum chloride and electrodesiccation   Outcome: patient tolerated procedure well   Post-procedure details: wound care instructions given   Post-procedure details comment:  Ointment and small bandage applied  Specimen 2 - Surgical pathology Differential Diagnosis: D48.5 Irritated Hemangioma vs other  Check Margins: yes 3.78mm violaceous pap  R post shoulder  Skin / nail biopsy Type of biopsy: tangential   Informed consent: discussed and consent obtained   Anesthesia: the lesion was anesthetized in a standard fashion   Anesthesia comment:  Area prepped with alcohol Anesthetic:  1% lidocaine w/ epinephrine 1-100,000 buffered w/ 8.4% NaHCO3 Instrument used: flexible razor blade   Hemostasis achieved with: pressure, aluminum chloride and electrodesiccation   Outcome: patient tolerated procedure well   Post-procedure details: wound care instructions given   Post-procedure details comment:  Ointment and small bandage applied  Specimen 3 - Surgical pathology Differential Diagnosis: D48.5 SK r/o Atypia  Check Margins: yes 6.61mm brow macule with irregular pigment slightly waxy  L medial knee  Skin / nail biopsy Type of biopsy: tangential   Informed consent: discussed and consent obtained   Anesthesia: the lesion was anesthetized in a standard fashion   Anesthesia comment:  Area prepped with alcohol Anesthetic:  1% lidocaine w/ epinephrine 1-100,000 buffered w/ 8.4% NaHCO3 Instrument used: flexible razor blade   Hemostasis achieved with: pressure, aluminum chloride and electrodesiccation   Outcome: patient tolerated procedure well   Post-procedure details: wound care instructions given   Post-procedure details comment:  Ointment and small bandage applied  Specimen 4 - Surgical pathology Differential Diagnosis: D48.5 Dermatofibroma vs other  Check Margins: No 5.46mm brown firm nodule      History of SCC (squamous cell carcinoma) of skin Right Hand - Posterior  Clear. Observe for recurrence. Call clinic for new or changing lesions.  Recommend regular skin exams, daily broad-spectrum spf 30+ sunscreen use, and photoprotection.     Seborrheic dermatitis face, ears  Improves with treatment, but recurs Cont Ketoconazole 2% cr qd/bid prn aa face/axilla Cont HC 2.5% cr qd/bid prn aa face/axilla Cont H&S shampoo  Seborrheic Dermatitis  -  is a chronic persistent rash characterized by pinkness and scaling most commonly of the mid face but also can occur on the scalp (dandruff), ears; mid chest and mid back. It tends to be exacerbated by stress and cooler weather.  People who have neurologic disease may experience new onset or exacerbation of existing seborrheic dermatitis.  The condition is not curable but treatable and can be controlled.   ketoconazole (NIZORAL) 2 % cream - face, ears  Other Related Medications hydrocortisone 2.5 % cream  History of dysplastic nevus Right back  Clear. Observe for recurrence. Call clinic for new or changing lesions.  Recommend regular skin exams, daily broad-spectrum spf 30+ sunscreen use, and photoprotection.     History of melanoma in situ L infraorbital cheek  Treated with Mohs, 2019  Clear. Observe for recurrence. Call clinic for new or changing lesions.  Recommend regular skin exams,  daily broad-spectrum spf 30+ sunscreen use, and photoprotection.     Inflamed seborrheic keratosis (2) Right Elbow x 1, R occipital scalp x 1  Destruction of lesion - Right Elbow x 1, R occipital scalp x 1  Destruction method: cryotherapy   Informed consent: discussed and consent obtained   Lesion destroyed using liquid nitrogen: Yes   Region frozen until ice ball extended beyond lesion: Yes   Outcome: patient tolerated procedure well with no complications   Post-procedure details: wound care instructions given    Xerosis cutis bil lower  legs  Recommend mild soap and moisturizing cream 1-2 times daily.   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  Observation.  Call clinic for new or changing moles.  Recommend daily use of broad spectrum spf 30+ sunscreen to sun-exposed areas.    Return in about 6 months (around 06/22/2021) for UBSE, Hx of Melanoma, Hx of AKs, Hx of SCC, Hx of Dysplastic nevi.   I, Othelia Pulling, RMA, am acting as scribe for [name], MD . Documentation: I have reviewed the above documentation for accuracy and completeness, and I agree with the above.  [name] MD

## 2020-12-21 ENCOUNTER — Ambulatory Visit (INDEPENDENT_AMBULATORY_CARE_PROVIDER_SITE_OTHER): Payer: Medicare Other | Admitting: Pharmacist

## 2020-12-21 DIAGNOSIS — E119 Type 2 diabetes mellitus without complications: Secondary | ICD-10-CM | POA: Diagnosis not present

## 2020-12-21 DIAGNOSIS — I10 Essential (primary) hypertension: Secondary | ICD-10-CM

## 2020-12-21 DIAGNOSIS — E785 Hyperlipidemia, unspecified: Secondary | ICD-10-CM | POA: Diagnosis not present

## 2020-12-21 DIAGNOSIS — E1165 Type 2 diabetes mellitus with hyperglycemia: Secondary | ICD-10-CM | POA: Diagnosis not present

## 2020-12-21 NOTE — Progress Notes (Signed)
Chronic Care Management Pharmacy Note  12/21/2020 Name:  Jesus Peterson MRN:  [id number] DOB:  15-Sep-1946  Subjective: Jesus Peterson is an 75 y.o. year old male who is a primary patient of [name], [name], MD.  The CCM team was consulted for assistance with disease management and care coordination needs.    Engaged with patient by telephone for follow up visit in response to provider referral for pharmacy case management and/or care coordination services.   Consent to Services:  The patient was given information about Chronic Care Management services, agreed to services, and gave verbal consent prior to initiation of services.  Please see initial visit note for detailed documentation.   Patient Care Team: [name], MD as PCP - General (Family Medicine) Kate Sable, MD as PCP - Cardiology (Cardiology) Corey Skains, MD as Referring Physician (Internal Medicine) Manya Silvas, MD (Inactive) (Gastroenterology) Oneta Rack, MD (Dermatology) De Hollingshead, RPH-CPP as Pharmacist (Pharmacist)  Recent office visits:  12/02/20- PCP visit. Lab values at goal, therapy continued.   Recent consult visits:  12/20/20- dermatology Dr. Nicole Kindred; a few biopsies were taken. Awaiting path results.   Hospital visits: None in previous 6 months  Objective:  Lab Results  Component Value Date   CREATININE 1.11 12/02/2020   BUN 28 (H) 12/02/2020   GFR 64.89 12/02/2020   GFRNONAA >60 06/18/2020   GFRAA >60 06/18/2020   NA 137 12/02/2020   K 3.9 12/02/2020   CALCIUM 9.3 12/02/2020   CO2 27 12/02/2020    Lab Results  Component Value Date/Time   HGBA1C 6.8 (H) 12/02/2020 09:31 AM   HGBA1C 7.1 (H) 07/12/2020 10:07 AM   HGBA1C 6.8 08/21/2019 12:00 AM   HGBA1C 7.0 11/03/2016 12:00 AM   GFR 64.89 12/02/2020 09:31 AM   GFR 61.45 11/06/2019 09:32 AM   GFR 61.45 11/06/2019 09:32 AM   MICROALBUR 2.2 (H) 11/06/2019 01:51 PM   MICROALBUR 1.6 03/27/2017 04:42 PM     Last diabetic Eye exam:  Lab Results  Component Value Date/Time   HMDIABEYEEXA No Retinopathy 09/08/2013 12:00 AM    Last diabetic Foot exam:  Lab Results  Component Value Date/Time   HMDIABFOOTEX Normal 05/21/2015 12:00 AM     Lab Results  Component Value Date   CHOL 99 12/02/2020   HDL 40.20 12/02/2020   LDLCALC 24 12/02/2020   LDLDIRECT 36.0 07/12/2020   TRIG 172.0 (H) 12/02/2020   CHOLHDL 2 12/02/2020    Hepatic Function Latest Ref Rng & Units 12/02/2020 04/06/2020 01/05/2020  Total Protein 6.0 - 8.3 g/dL 6.9 6.7 6.8  Albumin 3.5 - 5.2 g/dL 4.5 4.2 4.3  AST 0 - 37 U/L '16 17 15  ' ALT 0 - 53 U/L '24 21 22  ' Alk Phosphatase 39 - 117 U/L 56 64 60  Total Bilirubin 0.2 - 1.2 mg/dL 1.0 0.5 0.9  Bilirubin, Direct 0.0 - 0.3 mg/dL - - 0.2    Lab Results  Component Value Date/Time   TSH 3.09 01/23/2014 09:19 AM    CBC Latest Ref Rng & Units 06/18/2020 04/06/2020 08/21/2019  WBC 4.0 - 10.5 K/uL 6.5 7.9 7.9  Hemoglobin 13.0 - 17.0 g/dL 15.5 16.2 17.1  Hematocrit 39.0 - 52.0 % 45.7 49.6 51  Platelets 150 - 400 K/uL 148(L) 144(L) 150    Lab Results  Component Value Date/Time   VD25OH 29.04 08/21/2019 12:00 AM    Clinical ASCVD: No     Depression screen [facility] 2/9 12/02/2020 07/23/2020  07/12/2020  Decreased Interest 0 0 0  Down, Depressed, Hopeless 0 0 0  PHQ - 2 Score 0 0 0  Altered sleeping - - -  Tired, decreased energy - - -  Change in appetite - - -  Feeling bad or failure about yourself  - - -  Trouble concentrating - - -  Moving slowly or fidgety/restless - - -  Suicidal thoughts - - -  PHQ-9 Score - - -      Social History   Tobacco Use  Smoking Status Never Smoker  Smokeless Tobacco Never Used   BP Readings from Last 3 Encounters:  12/02/20 115/70  08/13/20 130/60  07/23/20 120/68   Pulse Readings from Last 3 Encounters:  12/02/20 60  08/13/20 73  07/22/20 61   Wt Readings from Last 3 Encounters:  12/02/20 228 lb 3.2 oz (103.5 kg)  08/13/20 226 lb  (102.5 kg)  07/23/20 224 lb (101.6 kg)    Assessment/Interventions: Review of patient past medical history, allergies, medications, health status, including review of consultants reports, laboratory and other test data, was performed as part of comprehensive evaluation and provision of chronic care management services.   SDOH:  (Social Determinants of Health) assessments and interventions performed: Yes SDOH Interventions   Flowsheet Row Most Recent Value  SDOH Interventions   Financial Strain Interventions Intervention Not Indicated      CCM Care Plan  Allergies  Allergen Reactions  . Contrast Media [Iodinated Diagnostic Agents]   . Metformin Nausea Only and Other (See Comments)  . Penicillins     Medications Reviewed Today    Reviewed by De Hollingshead, RPH-CPP (Pharmacist) on 12/21/20 at 1122  Med List Status: <None>  Medication Order Taking? Sig Documenting Provider Last Dose Status Informant  ALPRAZolam (XANAX) 0.5 MG tablet [id number] Yes  [provider] Taking Active   amLODipine (NORVASC) 10 MG tablet [id number] Yes TAKE 1 TABLET EVERY DAY [name], MD Taking Active   aspirin 81 MG chewable tablet [id number] Yes CHEW ONE TABLET BY MOUTH [provider] Taking Active   Bayer Microlet Lancets lancets [id number] Yes USE  TWICE DAILY ICD 10 E11.9 [name], MD Taking Active   blood glucose meter kit and supplies KIT [id number] Yes Dispense based on patient and insurance preference. Check once daily fasting, Dx Code E11.9 [name], MD Taking Active   Blood Pressure Monitoring (ADULT BLOOD PRESSURE CUFF LG) KIT [id number] Yes Use daily to check blood pressure [name], MD Taking Active   carvedilol (COREG) 12.5 MG tablet [id number] Yes Take 12.5 mg by mouth 2 (two) times daily with a meal. [provider] Taking Active   empagliflozin (JARDIANCE) 10 MG TABS tablet [id number] Yes Take 1 tablet (10 mg total) by mouth  daily before breakfast. [name], MD Taking Active   esomeprazole (NEXIUM) 20 MG capsule [id number] Yes TAKE 2 CAPSULES BY MOUTH [provider] Taking Active   GARLIC OIL PO [id number] Yes Take 1 capsule by mouth 4 (four) times daily. [provider] Taking Active   glucose blood (BAYER CONTOUR NEXT TEST) test strip [id number]  USE  STRIP TO CHECK GLUCOSE TWICE DAILY E11.9 [name], MD  Active   hydrochlorothiazide (HYDRODIURIL) 12.5 MG tablet [id number] Yes TAKE 1 TABLET EVERY DAY [name], MD Taking Active   hydrocortisone 2.5 % cream [id number] Yes APPLY TOPICALLY AS DIRECTED DAILY UP TO 4 DAYS PER WEEK TO AFFECTED AREA(S) OF FACE AND  UNDER ARMS AS NEEDED FOR FLARES [name], MD Taking Active   ketoconazole (NIZORAL) 2 % cream [id number] Yes Apply 1 application topically daily. Qd to aa scaly rash on face, ears prn flares [name], MD Taking Active   losartan (COZAAR) 100 MG tablet [id number] Yes Take 1 tablet (100 mg total) by mouth daily. [name], MD Taking Active   rosuvastatin (CRESTOR) 20 MG tablet [id number] Yes TAKE 1 TABLET EVERY DAY [name], MD Taking Active           Patient Active Problem List   Diagnosis Date Noted  . Acute right-sided low back pain without sciatica 08/13/2020  . Atypical chest pain 07/12/2020  . Dizziness 04/07/2020  . Chronic kidney disease 10/08/2019  . Eczema 09/23/2019  . Chronic cough 07/23/2019  . Skin infection 07/23/2019  . Seborrheic dermatitis 07/22/2018  . Prostate cancer screening 04/08/2018  . Melanoma in situ of cheek ([place]) 12/24/2017  . History of colon polyps 12/24/2017  . History of malignant melanoma of skin 12/11/2017  . Right upper quadrant abdominal pain 09/21/2017  . Toenail deformity 06/26/2017  . Hearing difficulty of both ears 03/26/2017  . Left foot pain 02/05/2017  . Diarrhea 02/05/2017  . Neck pain 10/18/2016  . Osteoarthritis of left hand 06/30/2016   . Angioma 06/30/2016  . Obstructive sleep apnea 07/03/2014  . Valvular heart disease 05/11/2014  . Nonspecific abnormal electrocardiogram (ECG) (EKG) 02/06/2014  . Diabetes type 2, controlled ([place]) 08/11/2013  . GERD (gastroesophageal reflux disease) 04/05/2012  . Hypertension 03/04/2012  . Erectile dysfunction 03/04/2012  . Hyperlipidemia 03/04/2012    Immunization History  Administered Date(s) Administered  . Fluad Quad(high Dose 65+) 07/12/2020  . Influenza Split 03/05/2011, 08/02/2012, 07/30/2014  . Influenza, Quadrivalent, Recombinant, Inj, Pf 07/17/2019  . Influenza,inj,Quad PF,6+ Mos 07/20/2017  . Influenza-Unspecified 08/06/2015, 08/16/2016, 07/19/2018, 07/26/2019  . Moderna Sars-Covid-2 Vaccination 02/25/2020, 03/24/2020, 09/23/2020  . Pneumococcal Conjugate-13 11/07/2013  . Pneumococcal Polysaccharide-23 03/05/2011    Conditions to be addressed/monitored:  Hypertension, Hyperlipidemia and Diabetes  Care Plan : Medication Management  Updates made by De Hollingshead, RPH-CPP since 12/21/2020 12:00 AM    Problem: Diabetes, Hypertension     Long-Range Goal: Disease Management   Start Date: 09/03/2020  This Visit's Progress: On track  Recent Progress: Not on track  Priority: High  Note:   Current Barriers:  . Unable to independently afford treatment regimen . Unable to achieve control of diabetes   Pharmacist Clinical Goal(s):  Marland Kitchen Over the next 90 days, patient will achieve control of diabetes as evidenced by improvement in A1c through collaboration with PharmD and provider  Interventions: . 1:1 collaboration with [name], MD regarding development and update of comprehensive plan of care as evidenced by provider attestation and co-signature . Inter-disciplinary care team collaboration (see longitudinal plan of care) . Comprehensive medication review performed; medication list updated in electronic medical record  Health Maintenance: . Up to date on  COVID vaccinations - added COVID booster to chart . Discussed Shingrix vaccinations, recommend he discuss coverage w/ the [facility] vs coverage w/ Part D plan . Reviewed dermatology appointment tomorrow. Reports a family history of skin cancer, so he is very cautious about skin care and regular dermatology exams .  Diabetes: . CONTROLLED; current treatment: Jardiance 10 mg daily o Hx metformin XR, significant GI upset . Current glucose readings: fasting glucose: 140-150s; not checking other times of the day; patient is somewhat disappointed that readings are higher than he  would like to be. Discussed that checking glucose at later times of the day would help provide more information regarding trends throughout the day. . Current exercise: rides bike daily for ~ 30 mintues . Patient asks about CGM. Discussed that his Medicare A/B + Part D plan would not cover CGM as he is not on insulin. Encouraged him to reach out to the [place] to inquire about VA coverage of CGM. In the meantime, discussed cash price of $75 for [place] 2 sensors. Discussed trying sample first to see if he finds it worth the $75 to him at this time. He is interested; scheduled nurse visit tomorrow for CGM education. He plans to bring his wife with him for tech support tomorrow, as she is more tech saavy than him. Will follow to determine if he would like a prescription sent to his local pharmacy to pay cash price.   Hypertension: . Controlled; current treatment: amlodipine 10 mg, carvedilol 12.5 mg BID; HCTZ 12.5 mg QAM, losartan 100 mg QAM . Current home readings: 120s/60s; reports a reading this morning was 130/75, but he had not taken his medications before checking, nor had he sat and rested for 5 minutes.  . Educated on appropriate BP checking technique.  . Denies any issues w/ lightheadedness/dizziness related to hypotension . Recommended to continue current regimen  Hyperlipidemia: . Controlled per last lipid panel; current treatment:  rosuvastatin 20 mg daily . Recommended to continue current regimen  Patient Goals/Self-Care Activities . Over the next 90 days, patient will:  - take medications as prescribed check glucose three times daily using CGm, document, and provide at future appointments check blood pressure daily, document, and provide at future appointments  Follow Up Plan: Telephone follow up appointment with care management team member scheduled for:~4 weeks         Medication Assistance: None required.  Patient affirms current coverage meets needs.  Patient's preferred pharmacy is:  [place], [place] [address] Phone: [phone] Fax: [phone]  [place], [place] - [place] [address] [place] [address] Phone: [phone] Fax: [place], [place]. [place] [address] Phone: [phone] Fax: [phone]    Care Plan and Follow Up Patient Decision:  Patient agrees to Care Plan and Follow-up.  Plan: Telephone follow up appointment with care management team member scheduled for:  ~ 4 weeks  Catie Darnelle Maffucci, PharmD, [place], [place] Clinical Pharmacist [employer] at [employer] [phone]

## 2020-12-21 NOTE — Patient Instructions (Addendum)
Visit Information  PATIENT GOALS: Goals Addressed              This Visit's Progress     Patient Stated   .  Medication Monitoring (pt-stated)        Patient Goals/Self-Care Activities . Over the next 90 days, patient will:  - take medications as prescribed check glucose three times daily using CGM, document, and provide at future appointments check blood pressure daily, document, and provide at future appointments       Patient verbalizes understanding of instructions provided today and agrees to view in [place].   Plan: Telephone follow up appointment with care management team member scheduled for:  ~ 4 weeks  Catie Darnelle Maffucci, PharmD, [place], [place] Clinical Pharmacist [employer] at [employer] [phone]

## 2020-12-22 ENCOUNTER — Ambulatory Visit: Payer: Medicare Other

## 2020-12-22 ENCOUNTER — Telehealth: Payer: Self-pay

## 2020-12-22 ENCOUNTER — Other Ambulatory Visit: Payer: Self-pay | Admitting: Family Medicine

## 2020-12-22 DIAGNOSIS — E785 Hyperlipidemia, unspecified: Secondary | ICD-10-CM

## 2020-12-22 NOTE — Telephone Encounter (Signed)
Advised patient of bx result/sh

## 2020-12-22 NOTE — Telephone Encounter (Signed)
-----   Message from [name], MD sent at 12/21/2020  8:10 PM EST ----- 1. Skin , left nasal labial fold CONSISTENT WITH SURFACE OF AN EPIDERMOID CYST 2. Skin , right inferior jaw HEMANGIOMA 3. Skin , right post shoulder PIGMENTED SEBORRHEIC KERATOSIS 4. Skin , left medial knee DERMATOFIBROMA, BASE INVOLVED  1. Benign cyst 2. Benign angioma 3. Benign SK 4. Benign Dermatofibroma  No further treatment needed for above benign lesions

## 2021-01-05 ENCOUNTER — Ambulatory Visit: Payer: Medicare Other | Admitting: Pharmacist

## 2021-01-05 DIAGNOSIS — E785 Hyperlipidemia, unspecified: Secondary | ICD-10-CM | POA: Diagnosis not present

## 2021-01-05 DIAGNOSIS — I10 Essential (primary) hypertension: Secondary | ICD-10-CM | POA: Diagnosis not present

## 2021-01-05 DIAGNOSIS — E1165 Type 2 diabetes mellitus with hyperglycemia: Secondary | ICD-10-CM

## 2021-01-05 DIAGNOSIS — E119 Type 2 diabetes mellitus without complications: Secondary | ICD-10-CM

## 2021-01-05 NOTE — Patient Instructions (Signed)
Visit Information  PATIENT GOALS: Goals Addressed              This Visit's Progress     Patient Stated   .  Medication Monitoring (pt-stated)        Patient Goals/Self-Care Activities . Over the next 90 days, patient will:  - take medications as prescribed check glucose three times daily using CGM, document, and provide at future appointments check blood pressure daily, document, and provide at future appointments       Patient verbalizes understanding of instructions provided today and agrees to view in [place].    Plan: Telephone follow up appointment with care management team member scheduled for:  ~ 2 weeks as previously scheduled  Catie Darnelle Maffucci, PharmD, [place], [place] Clinical Pharmacist [employer] at [facility] [phone]

## 2021-01-05 NOTE — Chronic Care Management (AMB) (Signed)
Chronic Care Management Pharmacy Note  01/05/2021 Name:  Jesus Peterson MRN:  [id number] DOB:  1946-08-24  Subjective: Jesus Peterson is an 75 y.o. year old male who is a primary patient of [name], [name], MD.  The CCM team was consulted for assistance with disease management and care coordination needs.    Engaged with patient by telephone for medication management question in response to provider referral for pharmacy case management and/or care coordination services.   Consent to Services:  The patient was given information about Chronic Care Management services, agreed to services, and gave verbal consent prior to initiation of services.  Please see initial visit note for detailed documentation.   Patient Care Team: [name], MD as PCP - General (Family Medicine) Kate Sable, MD as PCP - Cardiology (Cardiology) Corey Skains, MD as Referring Physician (Internal Medicine) Manya Silvas, MD (Inactive) (Gastroenterology) Oneta Rack, MD (Dermatology) De Hollingshead, RPH-CPP as Pharmacist (Pharmacist)  Recent office visits: None since our last call  Recent consult visits: None since our last call  Hospital visits: None in previous 6 months  Objective:  Lab Results  Component Value Date   CREATININE 1.11 12/02/2020   CREATININE 1.02 06/18/2020   CREATININE 0.97 04/06/2020    Lab Results  Component Value Date   HGBA1C 6.8 (H) 12/02/2020   Last diabetic Eye exam:  Lab Results  Component Value Date/Time   HMDIABEYEEXA No Retinopathy 09/08/2013 12:00 AM    Last diabetic Foot exam:  Lab Results  Component Value Date/Time   HMDIABFOOTEX Normal 05/21/2015 12:00 AM        Component Value Date/Time   CHOL 99 12/02/2020 0931   TRIG 172.0 (H) 12/02/2020 0931   HDL 40.20 12/02/2020 0931   CHOLHDL 2 12/02/2020 0931   VLDL 34.4 12/02/2020 0931   LDLCALC 24 12/02/2020 0931   LDLDIRECT 36.0 07/12/2020 1007    Hepatic Function  Latest Ref Rng & Units 12/02/2020 04/06/2020 01/05/2020  Total Protein 6.0 - 8.3 g/dL 6.9 6.7 6.8  Albumin 3.5 - 5.2 g/dL 4.5 4.2 4.3  AST 0 - 37 U/L '16 17 15  ' ALT 0 - 53 U/L '24 21 22  ' Alk Phosphatase 39 - 117 U/L 56 64 60  Total Bilirubin 0.2 - 1.2 mg/dL 1.0 0.5 0.9  Bilirubin, Direct 0.0 - 0.3 mg/dL - - 0.2    Lab Results  Component Value Date/Time   TSH 3.09 01/23/2014 09:19 AM    CBC Latest Ref Rng & Units 06/18/2020 04/06/2020 08/21/2019  WBC 4.0 - 10.5 K/uL 6.5 7.9 7.9  Hemoglobin 13.0 - 17.0 g/dL 15.5 16.2 17.1  Hematocrit 39.0 - 52.0 % 45.7 49.6 51  Platelets 150 - 400 K/uL 148(L) 144(L) 150    Lab Results  Component Value Date/Time   VD25OH 29.04 08/21/2019 12:00 AM    Clinical ASCVD: No    Social History   Tobacco Use  Smoking Status Never Smoker  Smokeless Tobacco Never Used   BP Readings from Last 3 Encounters:  12/02/20 115/70  08/13/20 130/60  07/23/20 120/68   Pulse Readings from Last 3 Encounters:  12/02/20 60  08/13/20 73  07/22/20 61   Wt Readings from Last 3 Encounters:  12/02/20 228 lb 3.2 oz (103.5 kg)  08/13/20 226 lb (102.5 kg)  07/23/20 224 lb (101.6 kg)    Assessment: Review of patient past medical history, allergies, medications, health status, including review of consultants reports, laboratory and other test  data, was performed as part of comprehensive evaluation and provision of chronic care management services.   SDOH:  (Social Determinants of Health) assessments and interventions performed: none today   CCM Care Plan  Allergies  Allergen Reactions  . Contrast Media [Iodinated Diagnostic Agents]   . Metformin Nausea Only and Other (See Comments)  . Penicillins     Medications Reviewed Today    Reviewed by De Hollingshead, RPH-CPP (Pharmacist) on 01/05/21 at 1633  Med List Status: <None>  Medication Order Taking? Sig Documenting Provider Last Dose Status Informant  ALPRAZolam (XANAX) 0.5 MG tablet [id number] No  [provider] Taking Active   amLODipine (NORVASC) 10 MG tablet [id number] No TAKE 1 TABLET EVERY DAY [name], MD Taking Active   aspirin 81 MG chewable tablet [id number] No CHEW ONE TABLET BY MOUTH [provider] Taking Active   Bayer Microlet Lancets lancets [id number] No USE  TWICE DAILY ICD 10 E11.9 [name], MD Taking Active   blood glucose meter kit and supplies KIT [id number] No Dispense based on patient and insurance preference. Check once daily fasting, Dx Code E11.9 [name], MD Taking Active   Blood Pressure Monitoring (ADULT BLOOD PRESSURE CUFF LG) KIT [id number] No Use daily to check blood pressure [name], MD Taking Active   carvedilol (COREG) 12.5 MG tablet [id number] No Take 12.5 mg by mouth 2 (two) times daily with a meal. [provider] Taking Active   empagliflozin (JARDIANCE) 10 MG TABS tablet [id number] No Take 1 tablet (10 mg total) by mouth daily before breakfast. [name], MD Taking Active   esomeprazole (NEXIUM) 20 MG capsule [id number] No TAKE 2 CAPSULES BY MOUTH [provider] Taking Active   GARLIC OIL PO [id number] No Take 1 capsule by mouth 4 (four) times daily. [provider] Taking Active   glucose blood (BAYER CONTOUR NEXT TEST) test strip [id number] No USE  STRIP TO CHECK GLUCOSE TWICE DAILY E11.9 [name], MD Taking Active   hydrochlorothiazide (HYDRODIURIL) 12.5 MG tablet [id number] No TAKE 1 TABLET EVERY DAY [name], MD Taking Active   hydrocortisone 2.5 % cream [id number] No APPLY TOPICALLY AS DIRECTED DAILY UP TO 4 DAYS PER WEEK TO AFFECTED AREA(S) OF FACE AND UNDER ARMS AS NEEDED FOR FLARES [name], MD Taking Active   ketoconazole (NIZORAL) 2 % cream [id number] No Apply 1 application topically daily. Qd to aa scaly rash on face, ears prn flares [name], MD Taking Active   losartan (COZAAR) 100 MG tablet [id number] No Take 1 tablet (100 mg total) by  mouth daily. [name], MD Taking Active   rosuvastatin (CRESTOR) 20 MG tablet [id number]  TAKE 1 TABLET EVERY DAY [name], MD  Active           Patient Active Problem List   Diagnosis Date Noted  . Acute right-sided low back pain without sciatica 08/13/2020  . Atypical chest pain 07/12/2020  . Dizziness 04/07/2020  . Chronic kidney disease 10/08/2019  . Eczema 09/23/2019  . Chronic cough 07/23/2019  . Skin infection 07/23/2019  . Seborrheic dermatitis 07/22/2018  . Prostate cancer screening 04/08/2018  . Melanoma in situ of cheek ([place]) 12/24/2017  . History of colon polyps 12/24/2017  . History of malignant melanoma of skin 12/11/2017  . Right upper quadrant abdominal pain 09/21/2017  . Toenail deformity 06/26/2017  . Hearing difficulty of both ears 03/26/2017  . Left foot pain 02/05/2017  .  Diarrhea 02/05/2017  . Neck pain 10/18/2016  . Osteoarthritis of left hand 06/30/2016  . Angioma 06/30/2016  . Obstructive sleep apnea 07/03/2014  . Valvular heart disease 05/11/2014  . Nonspecific abnormal electrocardiogram (ECG) (EKG) 02/06/2014  . Diabetes type 2, controlled ([place]) 08/11/2013  . GERD (gastroesophageal reflux disease) 04/05/2012  . Hypertension 03/04/2012  . Erectile dysfunction 03/04/2012  . Hyperlipidemia 03/04/2012    Immunization History  Administered Date(s) Administered  . Fluad Quad(high Dose 65+) 07/12/2020  . Influenza Split 03/05/2011, 08/02/2012, 07/30/2014  . Influenza, Quadrivalent, Recombinant, Inj, Pf 07/17/2019  . Influenza,inj,Quad PF,6+ Mos 07/20/2017  . Influenza-Unspecified 08/06/2015, 08/16/2016, 07/19/2018, 07/26/2019  . Moderna Sars-Covid-2 Vaccination 02/25/2020, 03/24/2020, 09/23/2020  . Pneumococcal Conjugate-13 11/07/2013  . Pneumococcal Polysaccharide-23 03/05/2011    Conditions to be addressed/monitored: HTN, HLD and DMII  Care Plan : Medication Management  Updates made by De Hollingshead, RPH-CPP since  01/05/2021 12:00 AM    Problem: Diabetes, Hypertension     Long-Range Goal: Disease Management   Start Date: 09/03/2020  This Visit's Progress: On track  Recent Progress: On track  Priority: High  Note:   Current Barriers:  . Unable to independently afford treatment regimen . Unable to achieve control of diabetes   Pharmacist Clinical Goal(s):  Marland Kitchen Over the next 90 days, patient will achieve control of diabetes as evidenced by improvement in A1c through collaboration with PharmD and provider  Interventions: . 1:1 collaboration with [name], MD regarding development and update of comprehensive plan of care as evidenced by provider attestation and co-signature . Inter-disciplinary care team collaboration (see longitudinal plan of care) . Comprehensive medication review performed; medication list updated in electronic medical record  Health Maintenance: . Up to date on COVID vaccinations . Previously discussed Shingrix vaccinations, recommend he discuss [facility] vs coverage w/ Part D plan  Diabetes: . CONTROLLED; current treatment: Jardiance 10 mg daily o Hx metformin XR, significant GI upset . Previously discussed trial of CGM. Had scheduled a nurse visit for this, but patient canceled. Will discuss moving forward.   Hypertension: . Controlled; current treatment: amlodipine 10 mg, carvedilol 12.5 mg BID; HCTZ 12.5 mg QAM, losartan 100 mg QAM . Calls today to ask about the "hydrochlorothiazide recall" he saw on the news . Educated that recall applies to combo quinapril/HCTZ, not HCTZ alone. Patient verbalized understanding.   Hyperlipidemia: . Controlled per last lipid panel; current treatment: rosuvastatin 20 mg daily . Recommended to continue current regimen  Patient Goals/Self-Care Activities . Over the next 90 days, patient will:  - take medications as prescribed check glucose three times daily using CGM, document, and provide at future  appointments check blood pressure daily, document, and provide at future appointments  Follow Up Plan: Telephone follow up appointment with care management team member scheduled for:~2 weeks as previously scheduled        Medication Assistance: None required.  Patient affirms current coverage meets needs.  Patient's preferred pharmacy is:  [facility], [place] [address] Phone: [phone] Fax: [phone]  [place], [place] - [place] [address] [name] [address] Phone: [phone] Fax: [place], [place]. [place] [address] Phone: [phone] Fax: [phone]  Follow Up:  Patient agrees to Care Plan and Follow-up.  Plan: Telephone follow up appointment with care management team member scheduled for:  ~  2 weeks as previously scheduled  Catie Darnelle Maffucci, PharmD, [place], [place] Clinical Pharmacist [employer] at [place]

## 2021-01-20 ENCOUNTER — Ambulatory Visit (INDEPENDENT_AMBULATORY_CARE_PROVIDER_SITE_OTHER): Payer: Medicare Other | Admitting: Pharmacist

## 2021-01-20 DIAGNOSIS — E785 Hyperlipidemia, unspecified: Secondary | ICD-10-CM | POA: Diagnosis not present

## 2021-01-20 DIAGNOSIS — I10 Essential (primary) hypertension: Secondary | ICD-10-CM

## 2021-01-20 DIAGNOSIS — E1165 Type 2 diabetes mellitus with hyperglycemia: Secondary | ICD-10-CM

## 2021-01-20 NOTE — Patient Instructions (Signed)
Visit Information  PATIENT GOALS: Goals Addressed              This Visit's Progress     Patient Stated   .  Medication Monitoring (pt-stated)        Patient Goals/Self-Care Activities . Over the next 90 days, patient will:  - take medications as prescribed check glucose three times daily using CGM, document, and provide at future appointments check blood pressure daily, document, and provide at future appointments        Patient verbalizes understanding of instructions provided today and agrees to view in [place].   Plan: Telephone follow up appointment with care management team member scheduled for:  ~ 12 weeks  Catie Darnelle Maffucci, PharmD, [place], [place] Clinical Pharmacist [employer] at [employer] [phone]

## 2021-01-20 NOTE — Chronic Care Management (AMB) (Signed)
Chronic Care Management Pharmacy Note  01/20/2021 Name:  Jesus Peterson MRN:  [id number] DOB:  1946-07-10  Subjective: Jesus Peterson is an 75 y.o. year old male who is a primary patient of [name], [name], MD.  The CCM team was consulted for assistance with disease management and care coordination needs.    Engaged with patient by telephone for follow up visit in response to provider referral for pharmacy case management and/or care coordination services.   Consent to Services:  The patient was given information about Chronic Care Management services, agreed to services, and gave verbal consent prior to initiation of services.  Please see initial visit note for detailed documentation.   Patient Care Team: [name], MD as PCP - General (Family Medicine) Kate Sable, MD as PCP - Cardiology (Cardiology) Corey Skains, MD as Referring Physician (Internal Medicine) Manya Silvas, MD (Inactive) (Gastroenterology) Oneta Rack, MD (Dermatology) De Hollingshead, RPH-CPP as Pharmacist (Pharmacist)  Recent office visits: None since our last call  Recent consult visits: None since our last call  Hospital visits: None in previous 6 months  Objective:  Lab Results  Component Value Date   CREATININE 1.11 12/02/2020   CREATININE 1.02 06/18/2020   CREATININE 0.97 04/06/2020    Lab Results  Component Value Date   HGBA1C 6.8 (H) 12/02/2020   Last diabetic Eye exam:  Lab Results  Component Value Date/Time   HMDIABEYEEXA No Retinopathy 09/08/2013 12:00 AM    Last diabetic Foot exam:  Lab Results  Component Value Date/Time   HMDIABFOOTEX Normal 05/21/2015 12:00 AM        Component Value Date/Time   CHOL 99 12/02/2020 0931   TRIG 172.0 (H) 12/02/2020 0931   HDL 40.20 12/02/2020 0931   CHOLHDL 2 12/02/2020 0931   VLDL 34.4 12/02/2020 0931   LDLCALC 24 12/02/2020 0931   LDLDIRECT 36.0 07/12/2020 1007    Hepatic Function Latest Ref Rng  & Units 12/02/2020 04/06/2020 01/05/2020  Total Protein 6.0 - 8.3 g/dL 6.9 6.7 6.8  Albumin 3.5 - 5.2 g/dL 4.5 4.2 4.3  AST 0 - 37 U/L '16 17 15  ' ALT 0 - 53 U/L '24 21 22  ' Alk Phosphatase 39 - 117 U/L 56 64 60  Total Bilirubin 0.2 - 1.2 mg/dL 1.0 0.5 0.9  Bilirubin, Direct 0.0 - 0.3 mg/dL - - 0.2    Lab Results  Component Value Date/Time   TSH 3.09 01/23/2014 09:19 AM    CBC Latest Ref Rng & Units 06/18/2020 04/06/2020 08/21/2019  WBC 4.0 - 10.5 K/uL 6.5 7.9 7.9  Hemoglobin 13.0 - 17.0 g/dL 15.5 16.2 17.1  Hematocrit 39.0 - 52.0 % 45.7 49.6 51  Platelets 150 - 400 K/uL 148(L) 144(L) 150    Lab Results  Component Value Date/Time   VD25OH 29.04 08/21/2019 12:00 AM    Clinical ASCVD: No    Social History   Tobacco Use  Smoking Status Never Smoker  Smokeless Tobacco Never Used   BP Readings from Last 3 Encounters:  12/02/20 115/70  08/13/20 130/60  07/23/20 120/68   Pulse Readings from Last 3 Encounters:  12/02/20 60  08/13/20 73  07/22/20 61   Wt Readings from Last 3 Encounters:  12/02/20 228 lb 3.2 oz (103.5 kg)  08/13/20 226 lb (102.5 kg)  07/23/20 224 lb (101.6 kg)    Assessment: Review of patient past medical history, allergies, medications, health status, including review of consultants reports, laboratory and other test  data, was performed as part of comprehensive evaluation and provision of chronic care management services.   SDOH:  (Social Determinants of Health) assessments and interventions performed:    CCM Care Plan  Allergies  Allergen Reactions  . Contrast Media [Iodinated Diagnostic Agents]   . Metformin Nausea Only and Other (See Comments)  . Penicillins     Medications Reviewed Today    Reviewed by De Hollingshead, RPH-CPP (Pharmacist) on 01/20/21 at 1135  Med List Status: <None>  Medication Order Taking? Sig Documenting Provider Last Dose Status Informant  amLODipine (NORVASC) 10 MG tablet [id number] Yes TAKE 1 TABLET EVERY DAY [name], MD Taking Active   Bayer Microlet Lancets lancets [id number] Yes USE  TWICE DAILY ICD 10 E11.9 [name], MD Taking Active   blood glucose meter kit and supplies KIT [id number] Yes Dispense based on patient and insurance preference. Check once daily fasting, Dx Code E11.9 [name], MD Taking Active   Blood Pressure Monitoring (ADULT BLOOD PRESSURE CUFF LG) KIT [id number] Yes Use daily to check blood pressure [name], MD Taking Active   carvedilol (COREG) 12.5 MG tablet [id number] Yes Take 12.5 mg by mouth 2 (two) times daily with a meal. [provider] Taking Active   empagliflozin (JARDIANCE) 25 MG TABS tablet [id number] Yes Take 12.5 mg by mouth daily. [provider] Taking Active   GARLIC OIL PO [id number] Yes Take 1 capsule by mouth 4 (four) times daily. [provider] Taking Active   glucose blood (BAYER CONTOUR NEXT TEST) test strip [id number] Yes USE  STRIP TO CHECK GLUCOSE TWICE DAILY E11.9 [name], MD Taking Active   hydrochlorothiazide (HYDRODIURIL) 12.5 MG tablet [id number] Yes TAKE 1 TABLET EVERY DAY [name], MD Taking Active   hydrocortisone 2.5 % cream [id number] Yes APPLY TOPICALLY AS DIRECTED DAILY UP TO 4 DAYS PER WEEK TO AFFECTED AREA(S) OF FACE AND UNDER ARMS AS NEEDED FOR FLARES [name], MD Taking Active   ketoconazole (NIZORAL) 2 % cream [id number] Yes Apply 1 application topically daily. Qd to aa scaly rash on face, ears prn flares [name], MD Taking Active   losartan (COZAAR) 100 MG tablet [id number] Yes Take 1 tablet (100 mg total) by mouth daily. [name], MD Taking Active   rosuvastatin (CRESTOR) 20 MG tablet [id number] Yes TAKE 1 TABLET EVERY DAY [name], MD Taking Active           Patient Active Problem List   Diagnosis Date Noted  . Acute right-sided low back pain without sciatica 08/13/2020  . Atypical chest pain 07/12/2020  . Dizziness 04/07/2020  .  Chronic kidney disease 10/08/2019  . Eczema 09/23/2019  . Chronic cough 07/23/2019  . Skin infection 07/23/2019  . Seborrheic dermatitis 07/22/2018  . Prostate cancer screening 04/08/2018  . Melanoma in situ of cheek ([place]) 12/24/2017  . History of colon polyps 12/24/2017  . History of malignant melanoma of skin 12/11/2017  . Right upper quadrant abdominal pain 09/21/2017  . Toenail deformity 06/26/2017  . Hearing difficulty of both ears 03/26/2017  . Left foot pain 02/05/2017  . Diarrhea 02/05/2017  . Neck pain 10/18/2016  . Osteoarthritis of left hand 06/30/2016  . Angioma 06/30/2016  . Obstructive sleep apnea 07/03/2014  . Valvular heart disease 05/11/2014  . Nonspecific abnormal electrocardiogram (ECG) (EKG) 02/06/2014  . Diabetes type 2, controlled ([place]) 08/11/2013  . GERD (gastroesophageal reflux disease) 04/05/2012  . Hypertension 03/04/2012  .  Erectile dysfunction 03/04/2012  . Hyperlipidemia 03/04/2012    Immunization History  Administered Date(s) Administered  . Fluad Quad(high Dose 65+) 07/12/2020  . Influenza Split 03/05/2011, 08/02/2012, 07/30/2014  . Influenza, Quadrivalent, Recombinant, Inj, Pf 07/17/2019  . Influenza,inj,Quad PF,6+ Mos 07/20/2017  . Influenza-Unspecified 08/06/2015, 08/16/2016, 07/19/2018, 07/26/2019  . Moderna Sars-Covid-2 Vaccination 02/25/2020, 03/24/2020, 09/23/2020  . Pneumococcal Conjugate-13 11/07/2013  . Pneumococcal Polysaccharide-23 03/05/2011    Conditions to be addressed/monitored: HTN, HLD and DMII  Care Plan : Medication Management  Updates made by De Hollingshead, RPH-CPP since 01/20/2021 12:00 AM    Problem: Diabetes, Hypertension     Long-Range Goal: Disease Management   Start Date: 09/03/2020  This Visit's Progress: On track  Recent Progress: On track  Priority: High  Note:   Current Barriers:  . Unable to independently afford treatment regimen . Unable to achieve control of diabetes   Pharmacist Clinical  Goal(s):  Marland Kitchen Over the next 90 days, patient will achieve control of diabetes as evidenced by improvement in A1c through collaboration with PharmD and provider  Interventions: . 1:1 collaboration with [name], MD regarding development and update of comprehensive plan of care as evidenced by provider attestation and co-signature . Inter-disciplinary care team collaboration (see longitudinal plan of care) . Comprehensive medication review performed; medication list updated in electronic medical record  Health Maintenance: . Up to date on COVID vaccinations . Reports [facility] the other [facility] to [facility] a Life Alert meter.   Diabetes: . Controlled per last A1c, but glucose readings elevated; current treatment: Jardiance 12.5 mg daily - [facility] prescribed him 25 mg tablets with instruction to take 1/2 tablet daily.  o Hx metformin XR, significant GI upset . Previously discussed trial of CGM. Patient would like to check and see if the [place] covers first.  . Home BP readings: fastings ~150-155. Not checking post prandial . Discussed goal A1c, goal fasting, and goal 2 hour post prandial readings. He notes that he will start taking some post prandial readings to more fully evaluate glucose throughout the day . Discussed that we could consider maximizing Jardiance dose. Patient would like to discuss with Dr. [name] and then [facility] provider (upcoming appt in June). Continue current regimen at this time.  Hypertension: . Controlled; current treatment: amlodipine 10 mg, carvedilol 12.5 mg BID; HCTZ 12.5 mg QAM, losartan 100 mg QAM . Reports that his BP machine broke. He is going to talk to the [place] about a new one. Plans to do so in June.  . Continue current regimen at this time . Educated that recall applies to combo quinapril/HCTZ, not HCTZ alone. Patient verbalized understanding.   Hyperlipidemia/ASCVD risk reduction: . Controlled per last lipid panel; current treatment: rosuvastatin  20 mg daily . Notes he is no longer taking aspirin 81 mg. Notes he discussed w/ cardiology and PCP and they noted that he does not need low dose aspirin given low ASCVD risk . Recommended to continue current regimen   Patient Goals/Self-Care Activities . Over the next 90 days, patient will:  - take medications as prescribed check glucose BID, document, and provide at future appointments check blood pressure daily, document, and provide at future appointments  Follow Up Plan: Telephone follow up appointment with care management team member scheduled for: 12 weeks      Medication Assistance: None required.  Patient affirms current coverage meets needs.  Patient's preferred pharmacy is:  [facility], [place] -  [place] [address] Phone: [phone] Fax: [phone]  [facility] [address], [place] - [place] [address] Phone: [phone] Fax: [phone]  [place], [place]. [place] [address] Phone: [phone] Fax: [phone]  Follow Up:  Patient agrees to Care Plan and Follow-up.  Plan: Telephone follow up appointment with care management team member scheduled for:  ~ 12 weeks  Catie Darnelle Maffucci, PharmD, [place], [place] Clinical Pharmacist [employer] at [employer] [phone]

## 2021-02-02 ENCOUNTER — Other Ambulatory Visit: Payer: Self-pay | Admitting: Family Medicine

## 2021-02-10 ENCOUNTER — Encounter: Payer: Self-pay | Admitting: Family Medicine

## 2021-02-11 ENCOUNTER — Other Ambulatory Visit: Payer: Self-pay

## 2021-02-11 DIAGNOSIS — E1165 Type 2 diabetes mellitus with hyperglycemia: Secondary | ICD-10-CM

## 2021-02-11 MED ORDER — CONTOUR NEXT TEST VI STRP: ORAL_STRIP | 2 refills | Status: AC

## 2021-02-14 ENCOUNTER — Other Ambulatory Visit: Payer: Self-pay | Admitting: Family Medicine

## 2021-02-16 ENCOUNTER — Other Ambulatory Visit: Payer: Self-pay | Admitting: Family Medicine

## 2021-02-21 DIAGNOSIS — Z23 Encounter for immunization: Secondary | ICD-10-CM | POA: Diagnosis not present

## 2021-02-22 ENCOUNTER — Encounter: Payer: Self-pay | Admitting: Emergency Medicine

## 2021-02-22 ENCOUNTER — Emergency Department
Admission: EM | Admit: 2021-02-22 | Discharge: 2021-02-23 | Disposition: A | Discharge status: Home / Self Care | Payer: No Typology Code available for payment source | Attending: Emergency Medicine | Admitting: Emergency Medicine

## 2021-02-22 ENCOUNTER — Other Ambulatory Visit: Payer: Self-pay

## 2021-02-22 DIAGNOSIS — Z85828 Personal history of other malignant neoplasm of skin: Secondary | ICD-10-CM | POA: Diagnosis not present

## 2021-02-22 DIAGNOSIS — Z79899 Other long term (current) drug therapy: Secondary | ICD-10-CM | POA: Insufficient documentation

## 2021-02-22 DIAGNOSIS — R197 Diarrhea, unspecified: Secondary | ICD-10-CM | POA: Diagnosis not present

## 2021-02-22 DIAGNOSIS — R42 Dizziness and giddiness: Secondary | ICD-10-CM | POA: Diagnosis present

## 2021-02-22 DIAGNOSIS — R112 Nausea with vomiting, unspecified: Secondary | ICD-10-CM | POA: Insufficient documentation

## 2021-02-22 DIAGNOSIS — N189 Chronic kidney disease, unspecified: Secondary | ICD-10-CM | POA: Diagnosis not present

## 2021-02-22 DIAGNOSIS — E1122 Type 2 diabetes mellitus with diabetic chronic kidney disease: Secondary | ICD-10-CM | POA: Diagnosis not present

## 2021-02-22 DIAGNOSIS — I129 Hypertensive chronic kidney disease with stage 1 through stage 4 chronic kidney disease, or unspecified chronic kidney disease: Secondary | ICD-10-CM | POA: Insufficient documentation

## 2021-02-22 DIAGNOSIS — R110 Nausea: Secondary | ICD-10-CM

## 2021-02-22 LAB — CBC WITH DIFFERENTIAL/PLATELET
Abs Immature Granulocytes: 0.06 10*3/uL (ref 0.00–0.07)
Basophils Absolute: 0.1 10*3/uL (ref 0.0–0.1)
Basophils Relative: 1 %
Eosinophils Absolute: 0.1 10*3/uL (ref 0.0–0.5)
Eosinophils Relative: 1 %
HCT: 48.7 % (ref 39.0–52.0)
Hemoglobin: 16.9 g/dL (ref 13.0–17.0)
Immature Granulocytes: 1 %
Lymphocytes Relative: 15 %
Lymphs Abs: 1.6 10*3/uL (ref 0.7–4.0)
MCH: 29.2 pg (ref 26.0–34.0)
MCHC: 34.7 g/dL (ref 30.0–36.0)
MCV: 84.1 fL (ref 80.0–100.0)
Monocytes Absolute: 1 10*3/uL (ref 0.1–1.0)
Monocytes Relative: 10 %
Neutro Abs: 7.9 10*3/uL — ABNORMAL HIGH (ref 1.7–7.7)
Neutrophils Relative %: 72 %
Platelets: 159 10*3/uL (ref 150–400)
RBC: 5.79 MIL/uL (ref 4.22–5.81)
RDW: 13.6 % (ref 11.5–15.5)
WBC: 10.8 10*3/uL — ABNORMAL HIGH (ref 4.0–10.5)
nRBC: 0 % (ref 0.0–0.2)

## 2021-02-22 LAB — COMPREHENSIVE METABOLIC PANEL
ALT: 25 U/L (ref 0–44)
AST: 25 U/L (ref 15–41)
Albumin: 4.4 g/dL (ref 3.5–5.0)
Alkaline Phosphatase: 57 U/L (ref 38–126)
Anion gap: 12 (ref 5–15)
BUN: 23 mg/dL (ref 8–23)
CO2: 19 mmol/L — ABNORMAL LOW (ref 22–32)
Calcium: 9.1 mg/dL (ref 8.9–10.3)
Chloride: 107 mmol/L (ref 98–111)
Creatinine, Ser: 0.97 mg/dL (ref 0.61–1.24)
GFR, Estimated: >60 mL/min (ref >60)
Glucose, Bld: 196 mg/dL — ABNORMAL HIGH (ref 70–99)
Potassium: 3.2 mmol/L — ABNORMAL LOW (ref 3.5–5.1)
Sodium: 138 mmol/L (ref 135–145)
Total Bilirubin: 1.2 mg/dL (ref 0.3–1.2)
Total Protein: 7.4 g/dL (ref 6.5–8.1)

## 2021-02-22 LAB — LIPASE, BLOOD: Lipase: 32 U/L (ref 11–51)

## 2021-02-22 LAB — TROPONIN I (HIGH SENSITIVITY): Troponin I (High Sensitivity): 4 ng/L (ref <18)

## 2021-02-22 MED ORDER — MECLIZINE HCL 25 MG PO TABS: 25.0000 mg | ORAL_TABLET | Freq: Three times a day (TID) | ORAL | 0 refills | Status: AC | PRN

## 2021-02-22 MED ORDER — SODIUM CHLORIDE 0.9 % IV BOLUS
1000.0000 mL | Freq: Once | INTRAVENOUS | Status: AC
  Administered 2021-02-22: 1000 mL via INTRAVENOUS

## 2021-02-22 MED ORDER — MECLIZINE HCL 25 MG PO TABS
25.0000 mg | ORAL_TABLET | Freq: Once | ORAL | Status: AC
  Administered 2021-02-22: 25 mg via ORAL
  Filled 2021-02-22: qty 1

## 2021-02-22 NOTE — ED Triage Notes (Signed)
Pt arrived via [facility]. Per EMS, pt was at dinner and had sudden onset of N/V/D with near syncope.   Per EMS, pt has hx of vertigo.   Pt got 4mg  IV zofran with EMS.   Pt currently A&Ox4, noted to be vomiting in ED exam room at this time.

## 2021-02-22 NOTE — ED Notes (Signed)
PT up OOB to use toilet at this time. Pt tolerated well, denies any dizziness/weakness at this time.

## 2021-02-22 NOTE — ED Notes (Signed)
ED Provider at bedside. 

## 2021-02-22 NOTE — Discharge Instructions (Signed)
Please seek medical attention for any high fevers, chest pain, shortness of breath, change in behavior, persistent vomiting, bloody stool or any other new or concerning symptoms.  

## 2021-02-22 NOTE — ED Provider Notes (Signed)
[facility] Emergency Department Provider Note   ____________________________________________   I have reviewed the triage vital signs and the nursing notes.   HISTORY  Chief Complaint Dizziness  History limited by: Not Limited   HPI Jesus Peterson is a 75 y.o. male who presents to the emergency department today because of concern for dizziness. The patient states that his symptoms started tonight when he went to a restaurant to meet a friend. He says that while he was walking into the restaurant he started feeling dizzy. Did try to sit down but shortly realized he would not be able to eat anything. Had his friend help him out of the restaurant and prior to reaching his car he vomited and soiled himself. He denies any associated abdominal pain. The patient says that he had similar episode of dizziness roughly 8 months ago. Saw an ENT at that time and was recommended an MRI but has not yet had that done. At the time of my exam the patient continues to have some dizziness that waxes and wanes.    Records reviewed. Per medical record review patient has a history of dizziness.   Past Medical History:  Diagnosis Date  . Actinic keratosis 08/18/2010   Left forearm. Hypertrophic.  . Cancer ([place])    MELANOMA OF SKIN  . Chronic kidney disease   . Dysplastic nevus 07/06/2010   Right back. Moderate atypia.   . Erectile dysfunction   . History of kidney stones   . Hyperlipidemia   . Hypertension   . Melanoma ([place]) 10/17/2017   L infraorbitial cheek, Melanoma IS txted with [facility] Dr. Lacinda Axon, pathology in media  . Seborrheic dermatitis   . Squamous cell carcinoma of skin ?   R hand, txted by Dr. Phillip Heal  . Treadmill stress test negative for angina pectoris 2013   Dr. Nehemiah Massed, normal per pt  . Valvular heart disease   . Venous angioma of brain ([place])    Pons, found 2/2 tinnitus, followed on MRI    Patient Active Problem List   Diagnosis Date Noted  . Acute  right-sided low back pain without sciatica 08/13/2020  . Atypical chest pain 07/12/2020  . Dizziness 04/07/2020  . Chronic kidney disease 10/08/2019  . Eczema 09/23/2019  . Chronic cough 07/23/2019  . Skin infection 07/23/2019  . Seborrheic dermatitis 07/22/2018  . Prostate cancer screening 04/08/2018  . Melanoma in situ of cheek ([place]) 12/24/2017  . History of colon polyps 12/24/2017  . History of malignant melanoma of skin 12/11/2017  . Right upper quadrant abdominal pain 09/21/2017  . Toenail deformity 06/26/2017  . Hearing difficulty of both ears 03/26/2017  . Left foot pain 02/05/2017  . Diarrhea 02/05/2017  . Neck pain 10/18/2016  . Osteoarthritis of left hand 06/30/2016  . Angioma 06/30/2016  . Obstructive sleep apnea 07/03/2014  . Valvular heart disease 05/11/2014  . Nonspecific abnormal electrocardiogram (ECG) (EKG) 02/06/2014  . Diabetes type 2, controlled ([place]) 08/11/2013  . GERD (gastroesophageal reflux disease) 04/05/2012  . Hypertension 03/04/2012  . Erectile dysfunction 03/04/2012  . Hyperlipidemia 03/04/2012    Past Surgical History:  Procedure Laterality Date  . COLONOSCOPY  08/24/2006,08/23/2012  . COLONOSCOPY WITH PROPOFOL N/A 09/09/2018   Procedure: COLONOSCOPY WITH PROPOFOL;  Surgeon: Lollie Sails, MD;  Location: [facility] ENDOSCOPY;  Service: Endoscopy;  Laterality: N/A;    Prior to Admission medications   Medication Sig Start Date End Date Taking? Authorizing Provider  amLODipine (NORVASC) 10 MG tablet TAKE 1 TABLET  EVERY DAY 02/14/21   [name], MD  Bayer Microlet Lancets lancets USE  TWICE DAILY ICD 10 E11.9 05/28/19   [name], MD  blood glucose meter kit and supplies KIT Dispense based on patient and insurance preference. Check once daily fasting, Dx Code E11.9 04/07/20   [name], MD  Blood Pressure Monitoring (ADULT BLOOD PRESSURE CUFF LG) KIT Use daily to check blood pressure 11/11/17   [name], MD   carvedilol (COREG) 12.5 MG tablet TAKE 1 AND 1/2 TABLETS TWICE DAILY WITH MEALS 02/17/21   [name], MD  empagliflozin (JARDIANCE) 25 MG TABS tablet Take 12.5 mg by mouth daily.    [provider]  GARLIC OIL PO Take 1 capsule by mouth 4 (four) times daily.    [provider]  glucose blood (CONTOUR NEXT TEST) test strip USE  STRIP TO CHECK GLUCOSE TWICE DAILY E11.9. 02/11/21   [name], MD  hydrochlorothiazide (HYDRODIURIL) 12.5 MG tablet TAKE 1 TABLET EVERY DAY 11/16/20   [name], MD  hydrocortisone 2.5 % cream APPLY TOPICALLY AS DIRECTED DAILY UP TO 4 DAYS PER WEEK TO AFFECTED AREA(S) OF FACE AND UNDER ARMS AS NEEDED FOR FLARES 11/16/20   [name], MD  losartan (COZAAR) 100 MG tablet Take 1 tablet (100 mg total) by mouth daily. 09/01/20   [name], MD  rosuvastatin (CRESTOR) 20 MG tablet TAKE 1 TABLET EVERY DAY 12/24/20   [name], MD    Allergies Contrast media [iodinated diagnostic agents], Metformin, and Penicillins  Family History  Problem Relation Age of Onset  . Stroke Mother   . Heart disease Mother        s/p CABG  . Heart disease Father   . Heart attack Sister   . Heart disease Brother        s/p CABG  . Heart disease Brother        s/p angioplasty  . Arthritis Brother     Social History Social History   Tobacco Use  . Smoking status: Never Smoker  . Smokeless tobacco: Never Used  Vaping Use  . Vaping Use: Never used  Substance Use Topics  . Alcohol use: No  . Drug use: Never    Review of Systems Constitutional: No fever/chills Eyes: No visual changes. ENT: No sore throat. Cardiovascular: Denies chest pain. Respiratory: Denies shortness of breath. Gastrointestinal: Positive for dizziness. Positive for nausea.  Genitourinary: Negative for dysuria. Musculoskeletal: Negative for back pain. Skin: Negative for rash. Neurological: Negative for headaches, focal weakness or  numbness.  ____________________________________________   PHYSICAL EXAM:  VITAL SIGNS: ED Triage Vitals  Enc Vitals Group     BP 02/22/21 1934 (!) 145/78     Pulse Rate 02/22/21 1934 74     Resp 02/22/21 1934 20     Temp 02/22/21 1934 98.1 F (36.7 C)     Temp Source 02/22/21 1934 Oral     SpO2 02/22/21 1934 99 %     Weight 02/22/21 1930 225 lb (102.1 kg)     Height 02/22/21 1930 '5\' 11"'  (1.803 m)     Head Circumference --      Peak Flow --      Pain Score 02/22/21 1929 0     Pain Loc --      Pain Edu? --      Excl. in [place]? --      Constitutional: Alert and oriented.  Eyes: Conjunctivae are normal.  ENT      Head: Normocephalic and atraumatic.      Nose: No congestion/rhinnorhea.      Mouth/Throat: Mucous membranes are moist.      Neck: No stridor. Hematological/Lymphatic/Immunilogical: No cervical lymphadenopathy. Cardiovascular: Normal rate, regular rhythm.  No murmurs, rubs, or gallops.  Respiratory: Normal respiratory effort without tachypnea nor retractions. Breath sounds are clear and equal bilaterally. No wheezes/rales/rhonchi. Gastrointestinal: Soft and non tender. No rebound. No guarding.  Genitourinary: Deferred Musculoskeletal: Normal range of motion in all extremities. No lower extremity edema. Neurologic:  Normal speech and language. No gross focal neurologic deficits are appreciated.  Skin:  Skin is warm, dry and intact. No rash noted. Psychiatric: Mood and affect are normal. Speech and behavior are normal. Patient exhibits appropriate insight and judgment.  ____________________________________________    LABS (pertinent positives/negatives)  CBC wbc 10.8, hgb 16.9, plt 159 Trop hs 4 Lipase 32 CMP wnl except k 3.2, co2 19, glu 196 ____________________________________________   EKG  I, Nance Pear, attending physician, personally viewed and interpreted this EKG  EKG Time: 1936 Rate: 74 Rhythm: sinus rhythm Axis: normal Intervals: qtc  482 QRS: Low voltage ST changes: no st elevation Impression: abnormal ekg  ____________________________________________    RADIOLOGY  None ____________________________________________   PROCEDURES  Procedures  ____________________________________________   INITIAL IMPRESSION / ASSESSMENT AND PLAN / ED COURSE  Pertinent labs & imaging results that were available during my care of the patient were reviewed by me and considered in my medical decision making (see chart for details).   Patient presented to the emergency department today because of concerns for dizziness, nausea vomiting and diarrhea.  Patient states he had a history of similar symptoms roughly 8 months ago.  On exam here patient without any vomiting or nausea.  Blood work without concerning electrolyte abnormality.  No concerning leukocytosis.  Was given meclizine and did feel significant improvement.  I did have a discussion with patient about potentially obtaining MRI.  He however does not feel comfortable trying to get an MRI or MRI and stated he would like to try to get 1 in an open MRI.  At this time given that the patient feels better and reassuring blood work I do think it is reasonable for patient to follow-up to get MRI as an outpatient.  Will give patient prescription for meclizine.  ____________________________________________   FINAL CLINICAL IMPRESSION(S) / ED DIAGNOSES  Final diagnoses:  Dizziness  Nausea     Note: This dictation was prepared with Dragon dictation. Any transcriptional errors that result from this process are unintentional     Nance Pear, MD 02/22/21 2330

## 2021-02-22 NOTE — ED Notes (Signed)
Pt up OOB assisted to toilet at this time, tolerated well. Pt given clean, disposable scrub pants and warm bath wipes at this time. Pt reminded to pull red cord to call for any assistance as needed, as well as when finished for assistance back to bed. Pt verbalized understanding at this time.

## 2021-02-22 NOTE — ED Notes (Signed)
Pt assisted back to bed at this time, tolerated well. Pt changed into clean gown. Clean chux and warm blanket provided at this time.

## 2021-02-23 ENCOUNTER — Other Ambulatory Visit: Payer: Self-pay

## 2021-02-23 NOTE — ED Notes (Signed)
Discharge instructions reviewed by other RN. Patient not charted out by that RN. Patient ambulatory out of ED with family member.

## 2021-03-01 ENCOUNTER — Other Ambulatory Visit: Payer: Self-pay

## 2021-03-01 ENCOUNTER — Telehealth (INDEPENDENT_AMBULATORY_CARE_PROVIDER_SITE_OTHER): Payer: Medicare Other | Admitting: Family Medicine

## 2021-03-01 ENCOUNTER — Encounter: Payer: Self-pay | Admitting: Family Medicine

## 2021-03-01 DIAGNOSIS — R42 Dizziness and giddiness: Secondary | ICD-10-CM | POA: Diagnosis not present

## 2021-03-01 DIAGNOSIS — E119 Type 2 diabetes mellitus without complications: Secondary | ICD-10-CM | POA: Diagnosis not present

## 2021-03-01 DIAGNOSIS — I10 Essential (primary) hypertension: Secondary | ICD-10-CM

## 2021-03-01 DIAGNOSIS — D0339 Melanoma in situ of other parts of face: Secondary | ICD-10-CM

## 2021-03-01 NOTE — Assessment & Plan Note (Signed)
Adequate control for age.  He will continue amlodipine 10 mg once daily, carvedilol 1.5 tablets twice daily, HCTZ 12.5 mg daily, and losartan 100 mg daily.

## 2021-03-01 NOTE — Assessment & Plan Note (Addendum)
Patient likely has peripheral vertigo.  Given recurrent episode discussed that it would be a good idea to proceed with an MRI.  I offered to order this today though he deferred and noted he wanted to look around for an appropriate open MRI for him to have this in.  He noted he would let me know when he is ready to schedule.  He will monitor for recurrence.  The patient's hypokalemia is likely related to his vomiting.  We will defer rechecking this as his vomiting has resolved.

## 2021-03-01 NOTE — Assessment & Plan Note (Signed)
He will continue to see dermatology. 

## 2021-03-01 NOTE — Progress Notes (Signed)
Virtual Visit via video Note  This visit type was conducted due to national recommendations for restrictions regarding the COVID-19 pandemic (e.g. social distancing).  This format is felt to be most appropriate for this patient at this time.  All issues noted in this document were discussed and addressed.  No physical exam was performed (except for noted visual exam findings with Video Visits).   I connected with Jesus Peterson today at 10:30 AM EDT by a video enabled telemedicine application or telephone and verified that I am speaking with the correct person using two identifiers. Location patient: car Location provider: work Persons participating in the virtual visit: patient, provider  I discussed the limitations, risks, security and privacy concerns of performing an evaluation and management service by telephone and the availability of in person appointments. I also discussed with the patient that there may be a patient responsible charge related to this service. The patient expressed understanding and agreed to proceed.   Reason for visit: f/u  HPI: Hypertension: Typically 130/80.  Taking amlodipine, carvedilol, HCTZ, and losartan.  No chest pain, shortness of breath, or edema.  Diabetes: Notes his sugar was 140 this morning.  He is taking Jardiance 12.5 mg once daily.  No polyuria or polydipsia.  No hypoglycemia.  History of melanoma: Notes he sees dermatology every 6 months.  No recent skin lesions noted or skin lesions removed per the patient.  Vertigo: Patient had a recurrent episode of vertigo after going about 8 months without an episode.  He was getting out of his car when the vertigo suddenly hit him.  He went downhill from there.  He had vomiting and also soiled himself.  He went to the emergency room and his work-up was reassuring.  He did have low potassium though he had been vomiting significantly.  He is now back to his baseline.  He saw ENT sometime last year and was  recommended to have an MRI to evaluate further though he has deferred that until now.  The patient took a home COVID test because he had vertigo though reported to staff he had no other symptoms to indicate the need for a test.   ROS: See pertinent positives and negatives per HPI.  Past Medical History:  Diagnosis Date  . Actinic keratosis 08/18/2010   Left forearm. Hypertrophic.  . Cancer ([place])    MELANOMA OF SKIN  . Chronic kidney disease   . Dysplastic nevus 07/06/2010   Right back. Moderate atypia.   . Erectile dysfunction   . History of kidney stones   . Hyperlipidemia   . Hypertension   . Melanoma ([place]) 10/17/2017   L infraorbitial cheek, Melanoma IS txted with [facility] Dr. Lacinda Axon, pathology in media  . Seborrheic dermatitis   . Squamous cell carcinoma of skin ?   R hand, txted by Dr. Phillip Heal  . Treadmill stress test negative for angina pectoris 2013   Dr. Nehemiah Massed, normal per pt  . Valvular heart disease   . Venous angioma of brain ([facility])    Pons, found 2/2 tinnitus, followed on MRI    Past Surgical History:  Procedure Laterality Date  . COLONOSCOPY  08/24/2006,08/23/2012  . COLONOSCOPY WITH PROPOFOL N/A 09/09/2018   Procedure: COLONOSCOPY WITH PROPOFOL;  Surgeon: Lollie Sails, MD;  Location: [facility] ENDOSCOPY;  Service: Endoscopy;  Laterality: N/A;    Family History  Problem Relation Age of Onset  . Stroke Mother   . Heart disease Mother  s/p CABG  . Heart disease Father   . Heart attack Sister   . Heart disease Brother        s/p CABG  . Heart disease Brother        s/p angioplasty  . Arthritis Brother     SOCIAL HX: Non-smoker   Current Outpatient Medications:  .  amLODipine (NORVASC) 10 MG tablet, TAKE 1 TABLET EVERY DAY, Disp: 90 tablet, Rfl: 2 .  Bayer Microlet Lancets lancets, USE  TWICE DAILY ICD 10 E11.9, Disp: 100 each, Rfl: 11 .  blood glucose meter kit and supplies KIT, Dispense based on patient and insurance preference. Check once daily  fasting, Dx Code E11.9, Disp: 1 each, Rfl: 0 .  Blood Pressure Monitoring (ADULT BLOOD PRESSURE CUFF LG) KIT, Use daily to check blood pressure, Disp: 1 each, Rfl: 0 .  carvedilol (COREG) 12.5 MG tablet, TAKE 1 AND 1/2 TABLETS TWICE DAILY WITH MEALS, Disp: 270 tablet, Rfl: 0 .  empagliflozin (JARDIANCE) 25 MG TABS tablet, Take 12.5 mg by mouth daily., Disp: , Rfl:  .  GARLIC OIL PO, Take 1 capsule by mouth 4 (four) times daily., Disp: , Rfl:  .  glucose blood (CONTOUR NEXT TEST) test strip, USE  STRIP TO CHECK GLUCOSE TWICE DAILY E11.9., Disp: 100 each, Rfl: 2 .  hydrochlorothiazide (HYDRODIURIL) 12.5 MG tablet, TAKE 1 TABLET EVERY DAY, Disp: 90 tablet, Rfl: 1 .  hydrocortisone 2.5 % cream, APPLY TOPICALLY AS DIRECTED DAILY UP TO 4 DAYS PER WEEK TO AFFECTED AREA(S) OF FACE AND UNDER ARMS AS NEEDED FOR FLARES, Disp: 84 g, Rfl: 2 .  losartan (COZAAR) 100 MG tablet, Take 1 tablet (100 mg total) by mouth daily., Disp: 90 tablet, Rfl: 2 .  meclizine (ANTIVERT) 25 MG tablet, Take 1 tablet (25 mg total) by mouth 3 (three) times daily as needed for dizziness., Disp: 20 tablet, Rfl: 0 .  rosuvastatin (CRESTOR) 20 MG tablet, TAKE 1 TABLET EVERY DAY, Disp: 90 tablet, Rfl: 1  EXAM:  VITALS per patient if applicable:  GENERAL: alert, oriented, appears well and in no acute distress  HEENT: atraumatic, conjunttiva clear, no obvious abnormalities on inspection of external nose and ears  NECK: normal movements of the head and neck  LUNGS: on inspection no signs of respiratory distress, breathing rate appears normal, no obvious gross SOB, gasping or wheezing  CV: no obvious cyanosis  MS: moves all visible extremities without noticeable abnormality  PSYCH/NEURO: pleasant and cooperative, no obvious depression or anxiety, speech and thought processing grossly intact  ASSESSMENT AND PLAN:  Discussed the following assessment and plan:  Problem List Items Addressed This Visit    Hypertension    Adequate  control for age.  He will continue amlodipine 10 mg once daily, carvedilol 1.5 tablets twice daily, HCTZ 12.5 mg daily, and losartan 100 mg daily.      Diabetes type 2, controlled ([place])    He will continue Jardiance.  He will return in 2 months for follow-up and for lab work.      Melanoma in situ of cheek [facility])    He will continue to see dermatology.      Vertigo    Patient likely has peripheral vertigo.  Given recurrent episode discussed that it would be a good idea to proceed with an MRI.  I offered to order this today though he deferred and noted he wanted to look around for an appropriate open MRI for him to have this in.  He noted  he would let me know when he is ready to schedule.  He will monitor for recurrence.  The patient's hypokalemia is likely related to his vomiting.  We will defer rechecking this as his vomiting has resolved.         Return in about 2 months (around 05/01/2021) for Diabetes.   I discussed the assessment and treatment plan with the patient. The patient was provided an opportunity to ask questions and all were answered. The patient agreed with the plan and demonstrated an understanding of the instructions.   The patient was advised to call back or seek an in-person evaluation if the symptoms worsen or if the condition fails to improve as anticipated.  [name], MD

## 2021-03-01 NOTE — Assessment & Plan Note (Signed)
He will continue Jardiance.  He will return in 2 months for follow-up and for lab work.

## 2021-03-10 ENCOUNTER — Telehealth: Payer: Self-pay | Admitting: Family Medicine

## 2021-03-10 DIAGNOSIS — H81399 Other peripheral vertigo, unspecified ear: Secondary | ICD-10-CM

## 2021-03-10 DIAGNOSIS — Z91041 Radiographic dye allergy status: Secondary | ICD-10-CM

## 2021-03-10 NOTE — Telephone Encounter (Signed)
Patient found a place to have a MRI done. [facility], [address] [place] Patient and Dr [name] spoke about this last week at patient's virtual. Patient is concerned about the contrast.

## 2021-03-15 NOTE — Telephone Encounter (Signed)
LVM for the patient informing him the MRI was ordered and to call back.  Kolbi Altadonna,cma

## 2021-03-15 NOTE — Telephone Encounter (Signed)
MRI ordered. Information placed in order comments.

## 2021-03-16 NOTE — Telephone Encounter (Signed)
I called and spoke with the patient and he stated unless its critical he still would rather have the MRI without,  he stated he had a xray over 30 years ago and MRI or CT scan was out back then and his doctor told him he could die if he had contrast so he would rather you order without. Patient stated he called the place that is doing the MRI and they stated they can do it without. They also stated that the order was with and without contrast.  Patient stated if he does have to have the contrast then he would want to see an allergist first since it has been so long ago.  Bobbye Reinitz,cma

## 2021-03-16 NOTE — Telephone Encounter (Signed)
PT called to return the missed call. He would like to be contacted on his cell number.

## 2021-03-16 NOTE — Telephone Encounter (Signed)
PT is needing the MRI order that was placed into [facility] to be corrected. They informed him that it was placed in as a order for MRI w wo Contrast and he states he is allergic to the Contrast and they advise him he would need to have his PCP reorder it as MRI wo Contrast. PT stated if Dr.Sonnenberg is uncomfortable with doing that to please contact him.

## 2021-03-16 NOTE — Telephone Encounter (Signed)
The contrast is different for MRIs and CT scans. His allergy is to iodinated contrast which is used with CT scans and not with an MRI. Has he ever had an allergic reaction to MRI contrast? Please confirm with the patient that his allergy occurred at the time of a prior CT scan.

## 2021-03-17 NOTE — Telephone Encounter (Signed)
I called and spoke with the patient and he would like to have the referral to see the allergist before doing the MRI.  Aziyah Provencal,cma

## 2021-03-17 NOTE — Telephone Encounter (Signed)
There is not an order in our system for this type of MRI to be done without contrast. Please relay this to the patient and I can refer him to an allergist to get their input first if he would like.

## 2021-03-18 NOTE — Telephone Encounter (Signed)
Referral placed.

## 2021-03-23 ENCOUNTER — Telehealth: Payer: Self-pay | Admitting: Family Medicine

## 2021-03-23 NOTE — Telephone Encounter (Signed)
  Message  Cancel I'm switching to [facility]     Appointment Scheduled  Hetzer, Shahil E  Mychart, Generic Just now (3:39 PM)      Cancel I'm switching to [employer], Generic  Loughmiller, [place] E 2 days ago   [employer]   Appointment Information:     Visit Type: Annual Wellness Visit         Date: 07/26/2021                 Dept: [place]                 Provider: [facility] Nurse Health Advisor                 Time: 9:45 AM                 Length: 45 min   Appt Status: Scheduled

## 2021-03-23 NOTE — Telephone Encounter (Signed)
Noted. Thanks.

## 2021-03-25 ENCOUNTER — Telehealth: Payer: Self-pay | Admitting: Family Medicine

## 2021-03-25 NOTE — Telephone Encounter (Signed)
Lft msg for pt to call ofc to update him on the referral. Thanks

## 2021-03-28 DIAGNOSIS — R270 Ataxia, unspecified: Secondary | ICD-10-CM | POA: Diagnosis not present

## 2021-03-28 DIAGNOSIS — E785 Hyperlipidemia, unspecified: Secondary | ICD-10-CM | POA: Diagnosis not present

## 2021-03-28 DIAGNOSIS — E1169 Type 2 diabetes mellitus with other specified complication: Secondary | ICD-10-CM | POA: Diagnosis not present

## 2021-03-28 DIAGNOSIS — E119 Type 2 diabetes mellitus without complications: Secondary | ICD-10-CM | POA: Diagnosis not present

## 2021-03-28 DIAGNOSIS — E782 Mixed hyperlipidemia: Secondary | ICD-10-CM | POA: Diagnosis not present

## 2021-03-28 DIAGNOSIS — Z125 Encounter for screening for malignant neoplasm of prostate: Secondary | ICD-10-CM | POA: Diagnosis not present

## 2021-03-31 DIAGNOSIS — R42 Dizziness and giddiness: Secondary | ICD-10-CM | POA: Diagnosis not present

## 2021-03-31 DIAGNOSIS — G119 Hereditary ataxia, unspecified: Secondary | ICD-10-CM | POA: Diagnosis not present

## 2021-04-06 DIAGNOSIS — L570 Actinic keratosis: Secondary | ICD-10-CM | POA: Diagnosis not present

## 2021-04-06 DIAGNOSIS — L821 Other seborrheic keratosis: Secondary | ICD-10-CM | POA: Diagnosis not present

## 2021-04-20 ENCOUNTER — Telehealth: Payer: Medicare Other

## 2021-06-30 DIAGNOSIS — W19XXXA Unspecified fall, initial encounter: Secondary | ICD-10-CM | POA: Diagnosis not present

## 2021-06-30 DIAGNOSIS — M1712 Unilateral primary osteoarthritis, left knee: Secondary | ICD-10-CM | POA: Diagnosis not present

## 2021-06-30 DIAGNOSIS — M25562 Pain in left knee: Secondary | ICD-10-CM | POA: Diagnosis not present

## 2021-07-04 DIAGNOSIS — Z23 Encounter for immunization: Secondary | ICD-10-CM | POA: Diagnosis not present

## 2021-07-05 ENCOUNTER — Ambulatory Visit (INDEPENDENT_AMBULATORY_CARE_PROVIDER_SITE_OTHER): Payer: Medicare Other | Admitting: Dermatology

## 2021-07-05 ENCOUNTER — Other Ambulatory Visit: Payer: Self-pay

## 2021-07-05 DIAGNOSIS — Q825 Congenital non-neoplastic nevus: Secondary | ICD-10-CM | POA: Diagnosis not present

## 2021-07-05 DIAGNOSIS — D2339 Other benign neoplasm of skin of other parts of face: Secondary | ICD-10-CM

## 2021-07-05 DIAGNOSIS — Z1283 Encounter for screening for malignant neoplasm of skin: Secondary | ICD-10-CM | POA: Diagnosis not present

## 2021-07-05 DIAGNOSIS — D1800 Hemangioma unspecified site: Secondary | ICD-10-CM

## 2021-07-05 DIAGNOSIS — D692 Other nonthrombocytopenic purpura: Secondary | ICD-10-CM | POA: Diagnosis not present

## 2021-07-05 DIAGNOSIS — L853 Xerosis cutis: Secondary | ICD-10-CM | POA: Diagnosis not present

## 2021-07-05 DIAGNOSIS — L219 Seborrheic dermatitis, unspecified: Secondary | ICD-10-CM

## 2021-07-05 DIAGNOSIS — L821 Other seborrheic keratosis: Secondary | ICD-10-CM

## 2021-07-05 DIAGNOSIS — Z86006 Personal history of melanoma in-situ: Secondary | ICD-10-CM | POA: Diagnosis not present

## 2021-07-05 DIAGNOSIS — Z85828 Personal history of other malignant neoplasm of skin: Secondary | ICD-10-CM | POA: Diagnosis not present

## 2021-07-05 DIAGNOSIS — D239 Other benign neoplasm of skin, unspecified: Secondary | ICD-10-CM

## 2021-07-05 DIAGNOSIS — D2371 Other benign neoplasm of skin of right lower limb, including hip: Secondary | ICD-10-CM | POA: Diagnosis not present

## 2021-07-05 DIAGNOSIS — Z86018 Personal history of other benign neoplasm: Secondary | ICD-10-CM

## 2021-07-05 DIAGNOSIS — L820 Inflamed seborrheic keratosis: Secondary | ICD-10-CM

## 2021-07-05 DIAGNOSIS — L578 Other skin changes due to chronic exposure to nonionizing radiation: Secondary | ICD-10-CM | POA: Diagnosis not present

## 2021-07-05 DIAGNOSIS — L814 Other melanin hyperpigmentation: Secondary | ICD-10-CM

## 2021-07-05 MED ORDER — HYDROCORTISONE 2.5 % EX CREA: TOPICAL_CREAM | CUTANEOUS | 5 refills | Status: DC

## 2021-07-05 MED ORDER — KETOCONAZOLE 2 % EX CREA: TOPICAL_CREAM | CUTANEOUS | 11 refills | Status: DC

## 2021-07-05 NOTE — Progress Notes (Signed)
Follow-Up Visit   Subjective  Jesus Peterson is a 75 y.o. male who presents for the following: Follow-up (Patient presents for 6 month follow-up UBSE. He hasn't noticed any new or changing spots. 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 He has a couple bumps in scalp that he picks at.   The following portions of the chart were reviewed this encounter and updated as appropriate:       Review of Systems:  No other skin or systemic complaints except as noted in HPI or Assessment and Plan.  Objective  Well appearing patient in no apparent distress; mood and affect are within normal limits.  All skin waist up examined.  Right Posterior Thigh Speckled brown plaque with hypertrichosis- 6.0 x 3.0 cm, present since birth, no changes  face Pink patches with greasy scale glabella, BL NL folds/paranasal   Right Upper Temple 2.75mm blue macule  Right and Left Occipital Scalp x 2 (2) Erythematous keratotic or waxy stuck-on papule    Assessment & Plan  Skin cancer screening performed today.  Actinic Damage - chronic, secondary to cumulative UV radiation exposure/sun exposure over time - diffuse scaly erythematous macules with underlying dyspigmentation - Recommend daily broad spectrum sunscreen SPF 30+ to sun-exposed areas, reapply every 2 hours as needed.  - Recommend staying in the shade or wearing long sleeves, sun glasses (UVA+UVB protection) and wide brim hats (4-inch brim around the entire circumference of the hat). - Call for new or changing lesions.  Hemangiomas - Red papules - Discussed benign nature - Observe - Call for any changes  Lentigines - Scattered tan macules - Due to sun exposure - Benign-appering, observe - Recommend daily broad spectrum sunscreen SPF 30+ to sun-exposed areas, reapply every 2 hours as needed. - Call for any  changes  Seborrheic Keratoses - Stuck-on, waxy, tan-brown papules and/or plaques  - Benign-appearing - Discussed benign etiology and prognosis. - Observe - Call for any changes  Congenital non-neoplastic nevus Right Posterior Thigh  Benign-appearing.  Observation.  Call clinic for new or changing moles.  Recommend daily use of broad spectrum spf 30+ sunscreen to sun-exposed areas.    Seborrheic dermatitis face  Seborrheic Dermatitis- flared today -  is a chronic persistent rash characterized by pinkness and scaling most commonly of the mid face but also can occur on the scalp (dandruff), ears; mid chest, mid back and groin.  It tends to be exacerbated by stress and cooler weather.  People who have neurologic disease may experience new onset or exacerbation of existing seborrheic dermatitis.  The condition is not curable but treatable and can be controlled.   Mix hydrocortisone (5 Rf) with ketaconazole 2% (1 yr Rf)twice a day. If improved, decrease to hydrocortisone and ketaconazole mixed once a day. If still clear, decrease to ketaconazole only.  Rxs sent to [facility].  hydrocortisone 2.5 % cream - face APPLY TOPICALLY AS DIRECTED DAILY UP TO 4 DAYS PER WEEK TO AFFECTED AREA(S) OF FACE AND EARS AS NEEDED FOR FLARES  ketoconazole (NIZORAL) 2 % cream - face Apply to affected areas face and ears 1-2 times a day.  Blue nevus Right Upper Temple  Benign-appearing.  Observation.  Call clinic for new or changing moles.  Recommend daily use of broad spectrum spf 30+ sunscreen to sun-exposed areas.    Inflamed seborrheic keratosis Right and Left  Occipital Scalp x 2  Destruction of lesion - Right and Left Occipital Scalp x 2  Destruction method: cryotherapy   Informed consent: discussed and consent obtained   Lesion destroyed using liquid nitrogen: Yes   Region frozen until ice ball extended beyond lesion: Yes   Outcome: patient tolerated procedure well with no complications    Post-procedure details: wound care instructions given   Additional details:  Prior to procedure, discussed risks of blister formation, small wound, skin dyspigmentation, or rare scar following cryotherapy. Recommend Vaseline ointment to treated areas while healing.  History of Squamous Cell Carcinoma of the Skin - No evidence of recurrence today of the right hand - Recommend regular full body skin exams - Recommend daily broad spectrum sunscreen SPF 30+ to sun-exposed areas, reapply every 2 hours as needed.  - Call if any new or changing lesions are noted between office visits  History of Dysplastic Nevus - No evidence of recurrence today of the right back - Recommend regular full body skin exams - Recommend daily broad spectrum sunscreen SPF 30+ to sun-exposed areas, reapply every 2 hours as needed.  - Call if any new or changing lesions are noted between office visits  Purpura - Chronic; persistent and recurrent.  Treatable, but not curable. - Violaceous macules and patches of the left elbow - Benign - Related to trauma, age, sun damage and/or use of blood thinners, chronic use of topical and/or oral steroids - Observe - Can use OTC arnica containing moisturizer such as Dermend Bruise Formula if desired - Call for worsening or other concerns  Dermatofibroma - Firm pink/brown papulenodule with dimple sign of the right medial lower leg x 2, R medial knee is biopsy proven - Benign growth possibly related to trauma, such as an insect bite.  Discussed removal (shave vrs excision), resulting scar and risk of recurrence. Since not bothersome, will observe for now.  - Call for any changes  Xerosis - diffuse xerotic patches body - recommend gentle, hydrating skin care - gentle skin care handout given   Return in about 6 months (around 01/02/2022) for UBSE.  IJamesetta Orleans, CMA, am acting as scribe for [name], MD .  Documentation: I have reviewed the above documentation for  accuracy and completeness, and I agree with the above.  [name] MD

## 2021-07-05 NOTE — Patient Instructions (Addendum)
Seborrheic Dermatitis  Mix hydrocortisone with ketaconazole 2% twice a day. If improved, decrease to hydrocortisone and ketaconazole mixed once a day. If still clear, decrease to ketaconazole only.  Zeasorb AF Powder - Apply to skin folds after shower for rash.   Dry Skin Care  What causes dry skin?  Dry skin is common and results from inadequate moisture in the outer skin layers. Dry skin usually results from the excessive loss of moisture from the skin surface. This occurs due to two major factors: Normally the skin's oil glands deposit a layer of oil on the skin's surface. This layer of oil prevents the loss of moisture from the skin. Exposure to soaps, cleaners, solvents, and disinfectants removes this oily film, allowing water to escape. Water loss from the skin increases when the humidity is low. During winter months we spend a lot of time indoors where the air is heated. Heated air has very low humidity. This also contributes to dry skin.  A tendency for dry skin may accompany such disorders as eczema. Also, as people age, the number of functioning oil glands decreases, and the tendency toward dry skin can be a sensation of skin tightness when emerging from the shower.  How do I manage dry skin?  Humidify your environment. This can be accomplished by using a humidifier in your bedroom at night during winter months. Bathing can actually put moisture back into your skin if done right. Take the following steps while bathing to sooth dry skin: Avoid hot water, which only dries the skin and makes itching worse. Use warm water. Avoid washcloths or extensive rubbing or scrubbing. Use mild soaps like unscented Dove, Oil of Olay, Cetaphil, Basis, or CeraVe. If you take baths rather than showers, rinse off soap residue with clean water before getting out of tub. Once out of the shower/tub, pat dry gently with a soft towel. Leave your skin damp. While still damp, apply any medicated  ointment/cream you were prescribed to the affected areas. After you apply your medicated ointment/cream, then apply your moisturizer to your whole body.This is the most important step in dry skin care. If this is omitted, your skin will continue to be dry. The choice of moisturizer is also very important. In general, lotion will not provider enough moisture to severely dry skin because it is water based. You should use an ointment or cream. Moisturizers should also be unscented. Good choices include Vaseline (plain petrolatum), Aquaphor, Cetaphil, CeraVe, Vanicream, DML Forte, Aveeno moisture, or Eucerin Cream. Bath oils can be helpful, but do not replace the application of moisturizer after the bath. In addition, they make the tub slippery causing an increased risk for falls. Therefore, we do not recommend their use.  If you have any questions or concerns for your doctor, please call our main line at [phone] and press option 4 to reach your doctor's medical assistant. If no one answers, please leave a voicemail as directed and we will return your call as soon as possible. Messages left after 4 pm will be answered the following business day.   You may also send [place] a message via Rupert. We typically respond to MyChart messages within 1-2 business days.  For prescription refills, please ask your pharmacy to contact our office. Our fax number is [phone].  If you have an urgent issue when the clinic is closed that cannot wait until the next business day, you can page your doctor at the number below.    Please note that while  we do our best to be available for urgent issues outside of office hours, we are not available 24/7.   If you have an urgent issue and are unable to reach [place], you may choose to seek medical care at your doctor's office, retail clinic, urgent care center, or emergency room.  If you have a medical emergency, please immediately call 911 or go to the emergency  department.  Pager Numbers  - Dr. Nehemiah Massed: [phone]  - Dr. Laurence Ferrari: [phone]  - Dr. Nicole Kindred: [phone]  In the event of inclement weather, please call our main line at [phone] for an update on the status of any delays or closures.  Dermatology Medication Tips: Please keep the boxes that topical medications come in in order to help keep track of the instructions about where and how to use these. Pharmacies typically print the medication instructions only on the boxes and not directly on the medication tubes.   If your medication is too expensive, please contact our office at [phone] option 4 or send [place] a message through Pray.   We are unable to tell what your co-pay for medications will be in advance as this is different depending on your insurance coverage. However, we may be able to find a substitute medication at lower cost or fill out paperwork to get insurance to cover a needed medication.   If a prior authorization is required to get your medication covered by your insurance company, please allow [place] 1-2 business days to complete this process.  Drug prices often vary depending on where the prescription is filled and some pharmacies may offer cheaper prices.  The website www.goodrx.com contains coupons for medications through different pharmacies. The prices here do not account for what the cost may be with help from insurance (it may be cheaper with your insurance), but the website can give you the price if you did not use any insurance.  - You can print the associated coupon and take it with your prescription to the pharmacy.  - You may also stop by our office during regular business hours and pick up a GoodRx coupon card.  - If you need your prescription sent electronically to a different pharmacy, notify our office through [facility] or by phone at [phone] option 4.

## 2021-07-26 ENCOUNTER — Ambulatory Visit: Payer: Medicare Other

## 2021-08-15 DIAGNOSIS — M5416 Radiculopathy, lumbar region: Secondary | ICD-10-CM | POA: Diagnosis not present

## 2021-08-15 DIAGNOSIS — E119 Type 2 diabetes mellitus without complications: Secondary | ICD-10-CM | POA: Diagnosis not present

## 2021-08-29 DIAGNOSIS — M5416 Radiculopathy, lumbar region: Secondary | ICD-10-CM | POA: Diagnosis not present

## 2021-08-29 DIAGNOSIS — E119 Type 2 diabetes mellitus without complications: Secondary | ICD-10-CM | POA: Diagnosis not present

## 2021-09-02 DIAGNOSIS — M47817 Spondylosis without myelopathy or radiculopathy, lumbosacral region: Secondary | ICD-10-CM | POA: Diagnosis not present

## 2021-09-12 DIAGNOSIS — M47817 Spondylosis without myelopathy or radiculopathy, lumbosacral region: Secondary | ICD-10-CM | POA: Diagnosis not present

## 2021-09-14 DIAGNOSIS — M47817 Spondylosis without myelopathy or radiculopathy, lumbosacral region: Secondary | ICD-10-CM | POA: Diagnosis not present

## 2021-09-20 DIAGNOSIS — E782 Mixed hyperlipidemia: Secondary | ICD-10-CM | POA: Diagnosis not present

## 2021-09-20 DIAGNOSIS — E1169 Type 2 diabetes mellitus with other specified complication: Secondary | ICD-10-CM | POA: Diagnosis not present

## 2021-09-27 DIAGNOSIS — E782 Mixed hyperlipidemia: Secondary | ICD-10-CM | POA: Diagnosis not present

## 2021-09-27 DIAGNOSIS — Z125 Encounter for screening for malignant neoplasm of prostate: Secondary | ICD-10-CM | POA: Diagnosis not present

## 2021-09-27 DIAGNOSIS — E119 Type 2 diabetes mellitus without complications: Secondary | ICD-10-CM | POA: Diagnosis not present

## 2021-09-27 DIAGNOSIS — E1169 Type 2 diabetes mellitus with other specified complication: Secondary | ICD-10-CM | POA: Diagnosis not present

## 2021-09-27 DIAGNOSIS — D696 Thrombocytopenia, unspecified: Secondary | ICD-10-CM | POA: Diagnosis not present

## 2021-09-30 DIAGNOSIS — E119 Type 2 diabetes mellitus without complications: Secondary | ICD-10-CM | POA: Diagnosis not present

## 2021-09-30 DIAGNOSIS — J0100 Acute maxillary sinusitis, unspecified: Secondary | ICD-10-CM | POA: Diagnosis not present

## 2021-10-11 DIAGNOSIS — R0602 Shortness of breath: Secondary | ICD-10-CM | POA: Diagnosis not present

## 2021-10-11 DIAGNOSIS — I679 Cerebrovascular disease, unspecified: Secondary | ICD-10-CM | POA: Diagnosis not present

## 2021-10-11 DIAGNOSIS — E782 Mixed hyperlipidemia: Secondary | ICD-10-CM | POA: Diagnosis not present

## 2021-10-11 DIAGNOSIS — E1169 Type 2 diabetes mellitus with other specified complication: Secondary | ICD-10-CM | POA: Diagnosis not present

## 2021-10-11 DIAGNOSIS — R002 Palpitations: Secondary | ICD-10-CM | POA: Diagnosis not present

## 2021-10-11 DIAGNOSIS — I10 Essential (primary) hypertension: Secondary | ICD-10-CM | POA: Diagnosis not present

## 2021-10-11 DIAGNOSIS — G4733 Obstructive sleep apnea (adult) (pediatric): Secondary | ICD-10-CM | POA: Diagnosis not present

## 2021-10-11 DIAGNOSIS — Z9989 Dependence on other enabling machines and devices: Secondary | ICD-10-CM | POA: Diagnosis not present

## 2021-10-20 DIAGNOSIS — H35033 Hypertensive retinopathy, bilateral: Secondary | ICD-10-CM | POA: Diagnosis not present

## 2021-10-20 DIAGNOSIS — H43813 Vitreous degeneration, bilateral: Secondary | ICD-10-CM | POA: Diagnosis not present

## 2021-10-20 DIAGNOSIS — I10 Essential (primary) hypertension: Secondary | ICD-10-CM | POA: Diagnosis not present

## 2021-10-20 DIAGNOSIS — H524 Presbyopia: Secondary | ICD-10-CM | POA: Diagnosis not present

## 2021-10-20 LAB — HM DIABETES EYE EXAM

## 2021-10-31 DIAGNOSIS — E119 Type 2 diabetes mellitus without complications: Secondary | ICD-10-CM | POA: Diagnosis not present

## 2021-10-31 DIAGNOSIS — I10 Essential (primary) hypertension: Secondary | ICD-10-CM | POA: Diagnosis not present

## 2021-10-31 DIAGNOSIS — R002 Palpitations: Secondary | ICD-10-CM | POA: Diagnosis not present

## 2021-10-31 DIAGNOSIS — I6523 Occlusion and stenosis of bilateral carotid arteries: Secondary | ICD-10-CM | POA: Diagnosis not present

## 2021-10-31 DIAGNOSIS — I679 Cerebrovascular disease, unspecified: Secondary | ICD-10-CM | POA: Diagnosis not present

## 2021-10-31 DIAGNOSIS — E782 Mixed hyperlipidemia: Secondary | ICD-10-CM | POA: Diagnosis not present

## 2021-10-31 DIAGNOSIS — R0602 Shortness of breath: Secondary | ICD-10-CM | POA: Diagnosis not present

## 2021-11-07 DIAGNOSIS — I679 Cerebrovascular disease, unspecified: Secondary | ICD-10-CM | POA: Diagnosis not present

## 2021-11-07 DIAGNOSIS — I10 Essential (primary) hypertension: Secondary | ICD-10-CM | POA: Diagnosis not present

## 2021-11-07 DIAGNOSIS — E782 Mixed hyperlipidemia: Secondary | ICD-10-CM | POA: Diagnosis not present

## 2021-11-07 DIAGNOSIS — E1169 Type 2 diabetes mellitus with other specified complication: Secondary | ICD-10-CM | POA: Diagnosis not present

## 2021-11-15 DIAGNOSIS — L720 Epidermal cyst: Secondary | ICD-10-CM | POA: Diagnosis not present

## 2021-11-15 DIAGNOSIS — D485 Neoplasm of uncertain behavior of skin: Secondary | ICD-10-CM | POA: Diagnosis not present

## 2021-12-14 ENCOUNTER — Other Ambulatory Visit: Payer: Self-pay | Admitting: Dermatology

## 2021-12-14 ENCOUNTER — Other Ambulatory Visit: Payer: Self-pay | Admitting: Family Medicine

## 2021-12-14 DIAGNOSIS — L219 Seborrheic dermatitis, unspecified: Secondary | ICD-10-CM

## 2021-12-14 NOTE — Telephone Encounter (Signed)
I spoke with patient & he is following now with Dr. Sabra Heck. I asked that he please call their office ASAP to have them refill.  ?

## 2022-01-03 ENCOUNTER — Other Ambulatory Visit: Payer: Self-pay

## 2022-01-03 ENCOUNTER — Ambulatory Visit (INDEPENDENT_AMBULATORY_CARE_PROVIDER_SITE_OTHER): Payer: Medicare Other | Admitting: Dermatology

## 2022-01-03 DIAGNOSIS — D692 Other nonthrombocytopenic purpura: Secondary | ICD-10-CM | POA: Diagnosis not present

## 2022-01-03 DIAGNOSIS — D229 Melanocytic nevi, unspecified: Secondary | ICD-10-CM | POA: Diagnosis not present

## 2022-01-03 DIAGNOSIS — D1800 Hemangioma unspecified site: Secondary | ICD-10-CM | POA: Diagnosis not present

## 2022-01-03 DIAGNOSIS — Z86018 Personal history of other benign neoplasm: Secondary | ICD-10-CM

## 2022-01-03 DIAGNOSIS — L219 Seborrheic dermatitis, unspecified: Secondary | ICD-10-CM

## 2022-01-03 DIAGNOSIS — Z86006 Personal history of melanoma in-situ: Secondary | ICD-10-CM | POA: Diagnosis not present

## 2022-01-03 DIAGNOSIS — L821 Other seborrheic keratosis: Secondary | ICD-10-CM

## 2022-01-03 DIAGNOSIS — D2339 Other benign neoplasm of skin of other parts of face: Secondary | ICD-10-CM

## 2022-01-03 DIAGNOSIS — L578 Other skin changes due to chronic exposure to nonionizing radiation: Secondary | ICD-10-CM | POA: Diagnosis not present

## 2022-01-03 DIAGNOSIS — L720 Epidermal cyst: Secondary | ICD-10-CM

## 2022-01-03 DIAGNOSIS — L820 Inflamed seborrheic keratosis: Secondary | ICD-10-CM | POA: Diagnosis not present

## 2022-01-03 DIAGNOSIS — Z1283 Encounter for screening for malignant neoplasm of skin: Secondary | ICD-10-CM

## 2022-01-03 DIAGNOSIS — B079 Viral wart, unspecified: Secondary | ICD-10-CM

## 2022-01-03 DIAGNOSIS — D239 Other benign neoplasm of skin, unspecified: Secondary | ICD-10-CM

## 2022-01-03 DIAGNOSIS — L814 Other melanin hyperpigmentation: Secondary | ICD-10-CM

## 2022-01-03 DIAGNOSIS — D2372 Other benign neoplasm of skin of left lower limb, including hip: Secondary | ICD-10-CM

## 2022-01-03 DIAGNOSIS — Z85828 Personal history of other malignant neoplasm of skin: Secondary | ICD-10-CM

## 2022-01-03 NOTE — Progress Notes (Signed)
? ?Follow-Up Visit ?  ?Subjective  ?Jesus Peterson is a 76 y.o. male who presents for the following: Follow-up. ? ?The patient presents for Upper Body Skin Exam (UBSE) for skin cancer screening and mole check.  The patient has spots, moles and lesions to be evaluated, some may be new or changing. He has a history of melanoma in situ of the left infraorbital cheek in 2019. Also history of BCC, SCC, and Dysplastic nevus.   He has an itchy spot on chest. ? ?The following portions of the chart were reviewed this encounter and updated as appropriate:  ?  ?  ? ?Review of Systems:  No other skin or systemic complaints except as noted in HPI or Assessment and Plan. ? ?Objective  ?Well appearing patient in no apparent distress; mood and affect are within normal limits. ? ?All skin waist up examined. ? ?Left Chest x 1 ?Erythematous stuck-on, waxy papule ? ?nasolabial, perinasal, glabella ?Pink patches with greasy scale.  ? ?Left Lateral Lower Leg ?4.0 mm Verrucous papule -- Discussed viral etiology and contagion.  ? ?Right Upper Temple ?2.0 mm blue macule ? ?Left Lateral Eye ?Smooth white papule(s).  ? ? ? ?Assessment & Plan  ?Skin cancer screening performed today. ? ?Actinic Damage ?- chronic, secondary to cumulative UV radiation exposure/sun exposure over time ?- diffuse scaly erythematous macules with underlying dyspigmentation ?- Recommend daily broad spectrum sunscreen SPF 30+ to sun-exposed areas, reapply every 2 hours as needed.  ?- Recommend staying in the shade or wearing long sleeves, sun glasses (UVA+UVB protection) and wide brim hats (4-inch brim around the entire circumference of the hat). ?- Call for new or changing lesions. ? ?Lentigines ?- Scattered tan macules ?- Due to sun exposure ?- Benign-appering, observe ?- Recommend daily broad spectrum sunscreen SPF 30+ to sun-exposed areas, reapply every 2 hours as needed. ?- Call for any changes ? ?Seborrheic Keratoses ?- Stuck-on, waxy, tan-brown papules and/or  plaques, including left upper eyelid  ?- Benign-appearing ?- Discussed benign etiology and prognosis. ?- Observe ?- Call for any changes ? ?Inflamed seborrheic keratosis ?Left Chest x 1 ? ?Destruction of lesion - Left Chest x 1 ? ?Destruction method: cryotherapy   ?Informed consent: discussed and consent obtained   ?Lesion destroyed using liquid nitrogen: Yes   ?Region frozen until ice ball extended beyond lesion: Yes   ?Outcome: patient tolerated procedure well with no complications   ?Post-procedure details: wound care instructions given   ?Additional details:  Prior to procedure, discussed risks of blister formation, small wound, skin dyspigmentation, or rare scar following cryotherapy. Recommend Vaseline ointment to treated areas while healing. ? ? ?Seborrheic dermatitis ?nasolabial, perinasal, glabella ? ?Chronic and persistent condition with duration or expected duration over one year. Condition is bothersome/symptomatic for patient. Currently flared.  ? ?Seborrheic Dermatitis  ?-  is a chronic persistent rash characterized by pinkness and scaling most commonly of the mid face but also can occur on the scalp (dandruff), ears; mid chest, mid back and groin.  It tends to be exacerbated by stress and cooler weather.  People who have neurologic disease may experience new onset or exacerbation of existing seborrheic dermatitis.  The condition is not curable but treatable and can be controlled. ? ?Continue ketoconazole 2% cream and hydrocortisone 2.5% cream qd/bid as directed.  ? ?Sample of Zoryve Cream and Vtama Cream given to patient. Apply QD to AA, one on each side.  ? ?May apply AmLactin Lotion qhs to face.  ? ?Recommend starting moisturizer  with exfoliant (Urea, Salicylic acid, or Lactic acid) one to two times daily to help smooth rough and bumpy skin.  OTC options include Cetaphil Rough and Bumpy lotion (Urea), Eucerin Roughness Relief lotion or spot treatment cream (Urea), CeraVe SA lotion/cream for Rough  and Bumpy skin (Sal Acid), Gold Bond Rough and Bumpy cream (Sal Acid), and AmLactin 12% lotion/cream (Lactic Acid).  If applying in morning, also apply sunscreen to sun-exposed areas, since these exfoliating moisturizers can increase sensitivity to sun. ? ? ?Related Medications ?ketoconazole (NIZORAL) 2 % cream ?Apply to affected areas face and ears 1-2 times a day. ? ?hydrocortisone 2.5 % cream ?APPLY TOPICALLY AS DIRECTED DAILY UP TO 4 DAYS PER WEEK TO AFFECTED AREA(S) OF FACE AND UNDER ARMS AS NEEDED FOR FLARES ? ?Viral warts, unspecified type ?Left Lateral Lower Leg ? ?Discussed viral etiology and risk of spread.  Discussed multiple treatments may be required to clear warts.  Discussed possible post-treatment dyspigmentation and risk of recurrence. ? ?Destruction of lesion - Left Lateral Lower Leg ? ?Destruction method: cryotherapy   ?Informed consent: discussed and consent obtained   ?Lesion destroyed using liquid nitrogen: Yes   ?Region frozen until ice ball extended beyond lesion: Yes   ?Outcome: patient tolerated procedure well with no complications   ?Post-procedure details: wound care instructions given   ?Additional details:  Prior to procedure, discussed risks of blister formation, small wound, skin dyspigmentation, or rare scar following cryotherapy. Recommend Vaseline ointment to treated areas while healing. ? ? ?Blue nevus ?Right Upper Temple ? ?Stable. Benign, observe.  ? ? ?Milia ?Left Lateral Eye ? ?Benign, observe.  ? ?Discussed extraction if becomes symptomatic. ? ? ?Hemangiomas ?- Red papules, including left scalp ?- Discussed benign nature ?- Observe ?- Call for any changes ? ?Melanocytic Nevi ?- Tan-brown and/or pink-flesh-colored symmetric macules and papules, including flesh papule of the left posterior shoulder ?- Benign appearing on exam today ?- Observation ?- Call clinic for new or changing moles ?- Recommend daily use of broad spectrum spf 30+ sunscreen to sun-exposed areas.  ? ?Purpura  - Chronic; persistent and recurrent.  Treatable, but not curable. ?- Violaceous macules and patches ?- Benign ?- Related to trauma, age, sun damage and/or use of blood thinners, chronic use of topical and/or oral steroids ?- Observe ?- Can use OTC arnica containing moisturizer such as Dermend Bruise Formula if desired ?- Call for worsening or other concerns ? ?History of Dysplastic Nevus ?- No evidence of recurrence today of the right back ?- Recommend regular full body skin exams ?- Recommend daily broad spectrum sunscreen SPF 30+ to sun-exposed areas, reapply every 2 hours as needed.  ?- Call if any new or changing lesions are noted between office visits ? ?Dermatofibroma ?- Firm pink/brown papulenodule with dimple sign of the left calf ?- Benign appearing ?- Call for any changes ? ?History of Melanoma in Situ L infraorbital Mohs 2019 ?- No evidence of recurrence today ?- Recommend regular full body skin exams ?- Recommend daily broad spectrum sunscreen SPF 30+ to sun-exposed areas, reapply every 2 hours as needed.  ?- Call if any new or changing lesions are noted between office visits  ?Return in about 6 months (around 07/06/2022) for Hx melanoma, Hx SCC, TBSE. ? ?I, Jamesetta Orleans, CMA, am acting as scribe for [name], MD . ?Documentation: I have reviewed the above documentation for accuracy and completeness, and I agree with the above. ? ?[name] MD  ? ? ?

## 2022-01-03 NOTE — Patient Instructions (Addendum)
Cryotherapy Aftercare ? ?Wash gently with soap and water everyday.   ?Apply Vaseline and Band-Aid daily until healed.  ? ? ?Viral Warts & Molluscum Contagiosum ? ?Viral warts and molluscum contagiosum are growths of the skin caused by viral infection of the skin. If you have been given the diagnosis of viral warts or molluscum contagiosum there are a few things that you must understand about your condition: ? ?There is no guaranteed treatment method available for this condition. ?Multiple treatments may be required, ?The treatments may be time consuming and require multiple visits to the dermatology office. ?The treatment may be expensive. You will be charged each time you come into the office to have the spots treated. ?The treated areas may develop new lesions further complicating treatment. ?The treated areas may leave a scar. ?There is no guarantee that even after multiple treatments that the spots will be successfully treated. ?These are caused by a viral infection and can be spread to other areas of the skin and to other people by direct contact. Therefore, new spots may occur. ? ?Seborrheic Dermatitis ? ?Mix hydrocortisone with ketaconazole 2% twice a day. If improved, decrease to hydrocortisone and ketaconazole mixed once a day. If still clear, decrease to ketaconazole only. ? ?Recommend starting moisturizer with exfoliant (Urea, Salicylic acid, or Lactic acid) one to two times daily to help smooth rough and bumpy skin.  OTC options include Cetaphil Rough and Bumpy lotion (Urea), Eucerin Roughness Relief lotion or spot treatment cream (Urea), CeraVe SA lotion/cream for Rough and Bumpy skin (Sal Acid), Gold Bond Rough and Bumpy cream (Sal Acid), and AmLactin 12% lotion/cream (Lactic Acid).  If applying in morning, also apply sunscreen to sun-exposed areas, since these exfoliating moisturizers can increase sensitivity to sun. ? ? ?If You Need Anything After Your Visit ? ?If you have any questions or concerns  for your doctor, please call our main line at [phone] and press option 4 to reach your doctor's medical assistant. If no one answers, please leave a voicemail as directed and we will return your call as soon as possible. Messages left after 4 pm will be answered the following business day.  ? ?You may also send [place] a message via MyChart. We typically respond to MyChart messages within 1-2 business days. ? ?For prescription refills, please ask your pharmacy to contact our office. Our fax number is [phone]. ? ?If you have an urgent issue when the clinic is closed that cannot wait until the next business day, you can page your doctor at the number below.   ? ?Please note that while we do our best to be available for urgent issues outside of office hours, we are not available 24/7.  ? ?If you have an urgent issue and are unable to reach [place], you may choose to seek medical care at your doctor's office, retail clinic, urgent care center, or emergency room. ? ?If you have a medical emergency, please immediately call 911 or go to the emergency department. ? ?Pager Numbers ? ?- Dr. Nehemiah Massed: [phone] ? ?- Dr. Laurence Ferrari: [phone] ? ?- Dr. Nicole Kindred: [phone] ? ?In the event of inclement weather, please call our main line at [phone] for an update on the status of any delays or closures. ? ?Dermatology Medication Tips: ?Please keep the boxes that topical medications come in in order to help keep track of the instructions about where and how to use these. Pharmacies typically print the medication instructions only on the boxes and not directly on the  medication tubes.  ? ?If your medication is too expensive, please contact our office at [phone] option 4 or send [place] a message through [place].  ? ?We are unable to tell what your co-pay for medications will be in advance as this is different depending on your insurance coverage. However, we may be able to find a substitute medication at lower cost or fill out  paperwork to get insurance to cover a needed medication.  ? ?If a prior authorization is required to get your medication covered by your insurance company, please allow [place] 1-2 business days to complete this process. ? ?Drug prices often vary depending on where the prescription is filled and some pharmacies may offer cheaper prices. ? ?The website www.goodrx.com contains coupons for medications through different pharmacies. The prices here do not account for what the cost may be with help from insurance (it may be cheaper with your insurance), but the website can give you the price if you did not use any insurance.  ?- You can print the associated coupon and take it with your prescription to the pharmacy.  ?- You may also stop by our office during regular business hours and pick up a GoodRx coupon card.  ?- If you need your prescription sent electronically to a different pharmacy, notify our office through [facility] or by phone at [phone] option 4. ? ? ? ? ?Si Usted Necesita Algo Despu?s de Su Visita ? ?Tambi?n puede enviarnos un mensaje a trav?s de MyChart. Por lo general respondemos a los mensajes de MyChart en el transcurso de 1 a 2 d?as h?biles. ? ?Para renovar recetas, por favor pida a su farmacia que se ponga en contacto con nuestra oficina. Nuestro n?mero de fax es el [phone]. ? ?Si tiene un asunto urgente cuando la cl?nica est? cerrada y que no puede esperar hasta el siguiente d?a h?bil, puede llamar/localizar a su doctor(a) al n?mero que aparece a continuaci?n.  ? ?Por favor, tenga en cuenta que aunque hacemos todo lo posible para estar disponibles para asuntos urgentes fuera del horario de oficina, no estamos disponibles las 24 horas del d?a, los 7 d?as de la semana.  ? ?Si tiene un problema urgente y no puede comunicarse con nosotros, puede optar por buscar atenci?n m?dica  en el consultorio de su doctor(a), en una cl?nica privada, en un centro de atenci?n urgente o en una sala de  emergencias. ? ?Si tiene Engineer, maintenance (IT) m?dica, por favor llame inmediatamente al 911 o vaya a la sala de emergencias. ? ?N?meros de b?per ? ?- Dr. Nehemiah Massed: [phone] ? ?- Dra. Moye: [phone] ? ?- Dra. Nicole Kindred: [phone] ? ?En caso de inclemencias del tiempo, por favor llame a nuestra l?nea principal al [phone] para una actualizaci?n sobre el estado de cualquier retraso o cierre. ? ?Consejos para la medicaci?n en dermatolog?a: ?Por favor, guarde las cajas en las que vienen los medicamentos de uso t?pico para ayudarle a seguir las instrucciones sobre d?nde y c?mo usarlos. Las farmacias generalmente imprimen las instrucciones del medicamento s?lo en las cajas y no directamente en los tubos del [place].  ? ?Si su medicamento es muy caro, por favor, p?ngase en contacto con Zigmund Daniel llamando al [phone] y presione la opci?n 4 o env?enos un mensaje a trav?s de MyChart.  ? ?No podemos decirle cu?l ser? su copago por los medicamentos por adelantado ya que esto es diferente dependiendo de la cobertura de su seguro. Sin embargo, es posible que podamos encontrar un medicamento sustituto a Garment/textile technologist  costo o llenar un formulario para que el seguro cubra el medicamento que se considera necesario.  ? ?Si se requiere Ardelia Mems autorizaci?n previa para que su compa??a de seguros [place] su medicamento, por favor perm?tanos de 1 a 2 d?as h?biles para completar este proceso. ? ?Los precios de los medicamentos var?an con frecuencia dependiendo del Environmental consultant de d?nde se surte la receta y alguna farmacias pueden ofrecer precios m?s baratos. ? ?El sitio web www.goodrx.com tiene cupones para medicamentos de Airline pilot. Los precios aqu? no tienen en cuenta lo que podr?a costar con la ayuda del seguro (puede ser m?s barato con su seguro), pero el sitio web puede darle el precio si no utiliz? ning?n seguro.  ?- Puede imprimir el cup?n correspondiente y llevarlo con su receta a la farmacia.  ?- Tambi?n puede pasar por  nuestra oficina durante el horario de atenci?n regular y recoger una tarjeta de cupones de GoodRx.  ?- Si necesita que su receta se env?e electr?nicamente a Chiropodist, informe a nuestra oficina

## 2022-01-12 DIAGNOSIS — E782 Mixed hyperlipidemia: Secondary | ICD-10-CM | POA: Diagnosis not present

## 2022-01-12 DIAGNOSIS — I679 Cerebrovascular disease, unspecified: Secondary | ICD-10-CM | POA: Diagnosis not present

## 2022-01-12 DIAGNOSIS — I10 Essential (primary) hypertension: Secondary | ICD-10-CM | POA: Diagnosis not present

## 2022-01-12 DIAGNOSIS — Z9989 Dependence on other enabling machines and devices: Secondary | ICD-10-CM | POA: Diagnosis not present

## 2022-01-12 DIAGNOSIS — G4733 Obstructive sleep apnea (adult) (pediatric): Secondary | ICD-10-CM | POA: Diagnosis not present

## 2022-01-13 DIAGNOSIS — H40011 Open angle with borderline findings, low risk, right eye: Secondary | ICD-10-CM | POA: Diagnosis not present

## 2022-01-27 DIAGNOSIS — Z20822 Contact with and (suspected) exposure to covid-19: Secondary | ICD-10-CM | POA: Diagnosis not present

## 2022-01-30 DIAGNOSIS — G4710 Hypersomnia, unspecified: Secondary | ICD-10-CM | POA: Diagnosis not present

## 2022-01-30 DIAGNOSIS — G4733 Obstructive sleep apnea (adult) (pediatric): Secondary | ICD-10-CM | POA: Diagnosis not present

## 2022-02-02 DIAGNOSIS — Z20822 Contact with and (suspected) exposure to covid-19: Secondary | ICD-10-CM | POA: Diagnosis not present

## 2022-03-06 DIAGNOSIS — G4739 Other sleep apnea: Secondary | ICD-10-CM | POA: Diagnosis not present

## 2022-03-06 DIAGNOSIS — D75839 Thrombocytosis, unspecified: Secondary | ICD-10-CM | POA: Diagnosis not present

## 2022-03-08 ENCOUNTER — Other Ambulatory Visit: Payer: Self-pay | Admitting: Family Medicine

## 2022-03-20 ENCOUNTER — Other Ambulatory Visit: Payer: Self-pay | Admitting: Family Medicine

## 2022-03-24 DIAGNOSIS — E782 Mixed hyperlipidemia: Secondary | ICD-10-CM | POA: Diagnosis not present

## 2022-03-24 DIAGNOSIS — E1169 Type 2 diabetes mellitus with other specified complication: Secondary | ICD-10-CM | POA: Diagnosis not present

## 2022-03-24 DIAGNOSIS — E538 Deficiency of other specified B group vitamins: Secondary | ICD-10-CM | POA: Diagnosis not present

## 2022-03-24 DIAGNOSIS — Z125 Encounter for screening for malignant neoplasm of prostate: Secondary | ICD-10-CM | POA: Diagnosis not present

## 2022-03-31 DIAGNOSIS — E119 Type 2 diabetes mellitus without complications: Secondary | ICD-10-CM | POA: Diagnosis not present

## 2022-03-31 DIAGNOSIS — Z85820 Personal history of malignant melanoma of skin: Secondary | ICD-10-CM | POA: Diagnosis not present

## 2022-03-31 DIAGNOSIS — E785 Hyperlipidemia, unspecified: Secondary | ICD-10-CM | POA: Diagnosis not present

## 2022-03-31 DIAGNOSIS — D696 Thrombocytopenia, unspecified: Secondary | ICD-10-CM | POA: Diagnosis not present

## 2022-03-31 DIAGNOSIS — Z0000 Encounter for general adult medical examination without abnormal findings: Secondary | ICD-10-CM | POA: Diagnosis not present

## 2022-04-12 DIAGNOSIS — S8012XA Contusion of left lower leg, initial encounter: Secondary | ICD-10-CM | POA: Diagnosis not present

## 2022-04-14 ENCOUNTER — Other Ambulatory Visit: Payer: Self-pay | Admitting: Family Medicine

## 2022-04-14 ENCOUNTER — Ambulatory Visit
Admission: RE | Admit: 2022-04-14 | Discharge: 2022-04-14 | Disposition: A | Discharge status: Home / Self Care | Payer: Medicare Other | Source: Ambulatory Visit | Attending: Family Medicine | Admitting: Family Medicine

## 2022-04-14 ENCOUNTER — Other Ambulatory Visit
Admission: RE | Admit: 2022-04-14 | Discharge: 2022-04-14 | Disposition: A | Discharge status: Home / Self Care | Payer: Medicare Other | Source: Ambulatory Visit | Attending: Family Medicine | Admitting: Family Medicine

## 2022-04-14 DIAGNOSIS — S8012XA Contusion of left lower leg, initial encounter: Secondary | ICD-10-CM | POA: Diagnosis not present

## 2022-04-14 DIAGNOSIS — M7989 Other specified soft tissue disorders: Secondary | ICD-10-CM

## 2022-04-14 DIAGNOSIS — S7012XA Contusion of left thigh, initial encounter: Secondary | ICD-10-CM | POA: Diagnosis not present

## 2022-04-14 DIAGNOSIS — M79605 Pain in left leg: Secondary | ICD-10-CM | POA: Insufficient documentation

## 2022-04-14 LAB — CBC WITH DIFFERENTIAL/PLATELET
Abs Immature Granulocytes: 0.04 10*3/uL (ref 0.00–0.07)
Basophils Absolute: 0 10*3/uL (ref 0.0–0.1)
Basophils Relative: 1 %
Eosinophils Absolute: 0.1 10*3/uL (ref 0.0–0.5)
Eosinophils Relative: 1 %
HCT: 51.4 % (ref 39.0–52.0)
Hemoglobin: 16.7 g/dL (ref 13.0–17.0)
Immature Granulocytes: 1 %
Lymphocytes Relative: 17 %
Lymphs Abs: 1.4 10*3/uL (ref 0.7–4.0)
MCH: 28.6 pg (ref 26.0–34.0)
MCHC: 32.5 g/dL (ref 30.0–36.0)
MCV: 88 fL (ref 80.0–100.0)
Monocytes Absolute: 0.7 10*3/uL (ref 0.1–1.0)
Monocytes Relative: 8 %
Neutro Abs: 6.2 10*3/uL (ref 1.7–7.7)
Neutrophils Relative %: 72 %
Platelets: 122 10*3/uL — ABNORMAL LOW (ref 150–400)
RBC: 5.84 MIL/uL — ABNORMAL HIGH (ref 4.22–5.81)
RDW: 13.9 % (ref 11.5–15.5)
WBC: 8.4 10*3/uL (ref 4.0–10.5)
nRBC: 0 % (ref 0.0–0.2)

## 2022-04-14 LAB — COMPREHENSIVE METABOLIC PANEL
ALT: 28 U/L (ref 0–44)
AST: 22 U/L (ref 15–41)
Albumin: 4.5 g/dL (ref 3.5–5.0)
Alkaline Phosphatase: 57 U/L (ref 38–126)
Anion gap: 8 (ref 5–15)
BUN: 27 mg/dL — ABNORMAL HIGH (ref 8–23)
CO2: 24 mmol/L (ref 22–32)
Calcium: 9.3 mg/dL (ref 8.9–10.3)
Chloride: 107 mmol/L (ref 98–111)
Creatinine, Ser: 0.97 mg/dL (ref 0.61–1.24)
GFR, Estimated: >60 mL/min (ref >60)
Glucose, Bld: 131 mg/dL — ABNORMAL HIGH (ref 70–99)
Potassium: 4 mmol/L (ref 3.5–5.1)
Sodium: 139 mmol/L (ref 135–145)
Total Bilirubin: 1.2 mg/dL (ref 0.3–1.2)
Total Protein: 7.4 g/dL (ref 6.5–8.1)

## 2022-04-25 DIAGNOSIS — S8012XD Contusion of left lower leg, subsequent encounter: Secondary | ICD-10-CM | POA: Diagnosis not present

## 2022-04-25 DIAGNOSIS — I10 Essential (primary) hypertension: Secondary | ICD-10-CM | POA: Diagnosis not present

## 2022-07-10 ENCOUNTER — Ambulatory Visit (INDEPENDENT_AMBULATORY_CARE_PROVIDER_SITE_OTHER): Payer: Medicare Other | Admitting: Dermatology

## 2022-07-10 ENCOUNTER — Ambulatory Visit: Payer: Medicare Other | Admitting: Dermatology

## 2022-07-10 DIAGNOSIS — L578 Other skin changes due to chronic exposure to nonionizing radiation: Secondary | ICD-10-CM | POA: Diagnosis not present

## 2022-07-10 DIAGNOSIS — R0602 Shortness of breath: Secondary | ICD-10-CM | POA: Diagnosis not present

## 2022-07-10 DIAGNOSIS — L219 Seborrheic dermatitis, unspecified: Secondary | ICD-10-CM

## 2022-07-10 DIAGNOSIS — D3610 Benign neoplasm of peripheral nerves and autonomic nervous system, unspecified: Secondary | ICD-10-CM

## 2022-07-10 DIAGNOSIS — Q825 Congenital non-neoplastic nevus: Secondary | ICD-10-CM | POA: Diagnosis not present

## 2022-07-10 DIAGNOSIS — Z86018 Personal history of other benign neoplasm: Secondary | ICD-10-CM

## 2022-07-10 DIAGNOSIS — Z1283 Encounter for screening for malignant neoplasm of skin: Secondary | ICD-10-CM | POA: Diagnosis not present

## 2022-07-10 DIAGNOSIS — D239 Other benign neoplasm of skin, unspecified: Secondary | ICD-10-CM

## 2022-07-10 DIAGNOSIS — D3612 Benign neoplasm of peripheral nerves and autonomic nervous system, upper limb, including shoulder: Secondary | ICD-10-CM

## 2022-07-10 DIAGNOSIS — L570 Actinic keratosis: Secondary | ICD-10-CM | POA: Diagnosis not present

## 2022-07-10 DIAGNOSIS — L814 Other melanin hyperpigmentation: Secondary | ICD-10-CM | POA: Diagnosis not present

## 2022-07-10 DIAGNOSIS — L720 Epidermal cyst: Secondary | ICD-10-CM | POA: Diagnosis not present

## 2022-07-10 DIAGNOSIS — L821 Other seborrheic keratosis: Secondary | ICD-10-CM

## 2022-07-10 DIAGNOSIS — D229 Melanocytic nevi, unspecified: Secondary | ICD-10-CM

## 2022-07-10 DIAGNOSIS — D1801 Hemangioma of skin and subcutaneous tissue: Secondary | ICD-10-CM | POA: Diagnosis not present

## 2022-07-10 DIAGNOSIS — D2339 Other benign neoplasm of skin of other parts of face: Secondary | ICD-10-CM

## 2022-07-10 DIAGNOSIS — G4733 Obstructive sleep apnea (adult) (pediatric): Secondary | ICD-10-CM | POA: Diagnosis not present

## 2022-07-10 DIAGNOSIS — Z86006 Personal history of melanoma in-situ: Secondary | ICD-10-CM

## 2022-07-10 DIAGNOSIS — D3617 Benign neoplasm of peripheral nerves and autonomic nervous system of trunk, unspecified: Secondary | ICD-10-CM

## 2022-07-10 MED ORDER — CLINDAMYCIN PHOSPHATE 1 % EX LOTN: TOPICAL_LOTION | Freq: Two times a day (BID) | CUTANEOUS | 0 refills | Status: AC

## 2022-07-10 MED ORDER — DOXYCYCLINE MONOHYDRATE 100 MG PO CAPS: 100.0000 mg | ORAL_CAPSULE | Freq: Two times a day (BID) | ORAL | 0 refills | Status: DC

## 2022-07-10 NOTE — Progress Notes (Signed)
Follow-Up Visit   Subjective  Jesus Peterson is a 76 y.o. male who presents for the following: TBSE (The patient presents for Total-Body Skin Exam (TBSE) for skin cancer screening and mole check.  The patient has spots, moles and lesions to be evaluated, some may be new or changing and the patient has concerns that these could be cancer. Patient with Hx of MMis, HxSCC, HxDN.).  He has a tender spot medial buttocks and is applying hot compresses.  It hasn't drained.  It is improving a little.  The following portions of the chart were reviewed this encounter and updated as appropriate:       Review of Systems:  No other skin or systemic complaints except as noted in HPI or Assessment and Plan.  Objective  Well appearing patient in no apparent distress; mood and affect are within normal limits.  A full examination was performed including scalp, head, eyes, ears, nose, lips, neck, chest, axillae, abdomen, back, buttocks, bilateral upper extremities, bilateral lower extremities, hands, feet, fingers, toes, fingernails, and toenails. All findings within normal limits unless otherwise noted below.  Right Upper Temple 2.0 mm blue macule  Left Lateral Eye Firm flesh white papule 2.5 mm  temples, forehead, eyebrows Pink patches with greasy scale.   Right Shoulder x 1 Erythematous thin papules/macules with gritty scale.   right lower hip 6.0 x 3.0 cm speckled brown patch with hypertrichosis   left post shoulder, left lower back Soft flesh papules  right perirectal Firm subq nodule with erythema    Assessment & Plan  Blue nevus Right Upper Temple  Benign-appearing.  Stable. Observation.  Call clinic for new or changing moles.  Recommend daily use of broad spectrum spf 30+ sunscreen to sun-exposed areas.    Milia Left Lateral Eye  Benign, observe.    Seborrheic dermatitis temples, forehead, eyebrows  Chronic and persistent condition with duration or expected duration  over one year. Condition is symptomatic/ bothersome to patient. Not currently at goal. Currently not using medication.  Improves when he uses topical Rx.  Continue ketoconazole 2% cream 1-2 times daily as needed.  Continue HC 2.5% cream 1-2 times daily for up to 4 days as needed for flares.  Seborrheic Dermatitis  -  is a chronic persistent rash characterized by pinkness and scaling most commonly of the mid face but also can occur on the scalp (dandruff), ears; mid chest, mid back and groin.  It tends to be exacerbated by stress and cooler weather.  People who have neurologic disease may experience new onset or exacerbation of existing seborrheic dermatitis.  The condition is not curable but treatable and can be controlled.   Related Medications ketoconazole (NIZORAL) 2 % cream Apply to affected areas face and ears 1-2 times a day.  hydrocortisone 2.5 % cream APPLY TOPICALLY AS DIRECTED DAILY UP TO 4 DAYS PER WEEK TO AFFECTED AREA(S) OF FACE AND UNDER ARMS AS NEEDED FOR FLARES  AK (actinic keratosis) Right Shoulder x 1  Actinic keratoses are precancerous spots that appear secondary to cumulative UV radiation exposure/sun exposure over time. They are chronic with expected duration over 1 year. A portion of actinic keratoses will progress to squamous cell carcinoma of the skin. It is not possible to reliably predict which spots will progress to skin cancer and so treatment is recommended to prevent development of skin cancer.  Recommend daily broad spectrum sunscreen SPF 30+ to sun-exposed areas, reapply every 2 hours as needed.  Recommend staying in the  shade or wearing long sleeves, sun glasses (UVA+UVB protection) and wide brim hats (4-inch brim around the entire circumference of the hat). Call for new or changing lesions.  Hypertrophic at right shoulder  Destruction of lesion - Right Shoulder x 1  Destruction method: cryotherapy   Informed consent: discussed and consent obtained    Lesion destroyed using liquid nitrogen: Yes   Region frozen until ice ball extended beyond lesion: Yes   Outcome: patient tolerated procedure well with no complications   Post-procedure details: wound care instructions given   Additional details:  Prior to procedure, discussed risks of blister formation, small wound, skin dyspigmentation, or rare scar following cryotherapy. Recommend Vaseline ointment to treated areas while healing.   Congenital non-neoplastic nevus right lower hip  Benign-appearing.  Observation.  Call clinic for new or changing moles.  Recommend daily use of broad spectrum spf 30+ sunscreen to sun-exposed areas.    Neurofibroma left post shoulder, left lower back  Benign, observe.    Epidermal inclusion cyst right perirectal  Inflamed  Benign-appearing. Exam most consistent with an epidermal inclusion cyst. Discussed that a cyst is a benign growth that can grow over time and sometimes get irritated or inflamed. Recommend observation if it is not bothersome. Discussed option of surgical excision to remove it if it is growing, symptomatic, or other changes noted. Please call for new or changing lesions so they can be evaluated.  Start clindamycin gel to affected area a few times daily. Start doxycycline monohydrate 100 mg twice daily with food x 2 weeks and if improving decrease to once daily. #60 0RF Continue warm compresses 1-2x daily  Doxycycline should be taken with food to prevent nausea. Do not lay down for 30 minutes after taking. Be cautious with sun exposure and use good sun protection while on this medication. Pregnant women should not take this medication.   If not improving after couple weeks, recommend pt seeing general surgeon for possible excision.    doxycycline (MONODOX) 100 MG capsule - right perirectal Take 1 capsule (100 mg total) by mouth 2 (two) times daily. Take with food  clindamycin (CLEOCIN-T) 1 % lotion - right perirectal Apply  topically 2 (two) times daily.  Lentigines - Scattered tan macules - Due to sun exposure - Benign-appearing, observe - Recommend daily broad spectrum sunscreen SPF 30+ to sun-exposed areas, reapply every 2 hours as needed. - Call for any changes  Seborrheic Keratoses - Stuck-on, waxy, tan-brown papules and/or plaques  - Benign-appearing - Discussed benign etiology and prognosis. - Observe - Call for any changes  Melanocytic Nevi - Tan-brown and/or pink-flesh-colored symmetric macules and papules - Benign appearing on exam today - Observation - Call clinic for new or changing moles - Recommend daily use of broad spectrum spf 30+ sunscreen to sun-exposed areas.   Hemangiomas - Red papules - Discussed benign nature - Observe - Call for any changes  Actinic Damage - Chronic condition, secondary to cumulative UV/sun exposure - diffuse scaly erythematous macules with underlying dyspigmentation - Recommend daily broad spectrum sunscreen SPF 30+ to sun-exposed areas, reapply every 2 hours as needed.  - Staying in the shade or wearing long sleeves, sun glasses (UVA+UVB protection) and wide brim hats (4-inch brim around the entire circumference of the hat) are also recommended for sun protection.  - Call for new or changing lesions.  History of Melanoma in Situ- Mohs 2019 - No evidence of recurrence today L infraorbital cheek - Recommend regular full body skin exams - Recommend  daily broad spectrum sunscreen SPF 30+ to sun-exposed areas, reapply every 2 hours as needed.  - Call if any new or changing lesions are noted between office visits   History of Dysplastic Nevi - No evidence of recurrence today - Recommend regular full body skin exams - Recommend daily broad spectrum sunscreen SPF 30+ to sun-exposed areas, reapply every 2 hours as needed.  - Call if any new or changing lesions are noted between office visits   Skin cancer screening performed today.  Return in about 6  months (around 01/08/2023) for UBSE.  Graciella Belton, RMA, am acting as scribe for [name], MD .  Documentation: I have reviewed the above documentation for accuracy and completeness, and I agree with the above.  [name] MD

## 2022-07-10 NOTE — Progress Notes (Deleted)
Follow-Up Visit   Subjective  Jesus Peterson is a 76 y.o. male who presents for the following: TBSE (The patient presents for Total-Body Skin Exam (TBSE) for skin cancer screening and mole check.  The patient has spots, moles and lesions to be evaluated, some may be new or changing and the patient has concerns that these could be cancer. Patient with Hx of MMis, HxSCC, HxDN.).  Patient has a spot at buttocks crease that he feels could be a cyst. Present for a few weeks and has been tender but no drainage. Patient has been using hot compresses.   The following portions of the chart were reviewed this encounter and updated as appropriate:       Review of Systems:  No other skin or systemic complaints except as noted in HPI or Assessment and Plan.  Objective  Well appearing patient in no apparent distress; mood and affect are within normal limits.  A full examination was performed including scalp, head, eyes, ears, nose, lips, neck, chest, axillae, abdomen, back, buttocks, bilateral upper extremities, bilateral lower extremities, hands, feet, fingers, toes, fingernails, and toenails. All findings within normal limits unless otherwise noted below.  Right Upper Temple 2.0 mm blue macule  Left Lateral Eye Firm flesh white papule 2.5 mm  temples, forehead, eyebrows Pink patches with greasy scale.   Right Shoulder x 1 Erythematous thin papules/macules with gritty scale.   right lower hip 6.0 x 3.0 cm speckled brown patch with hypertrichosis   left post shoulder, left lower back Soft flesh papules  right perirectal Firm subq nodule with erythema    Assessment & Plan  Blue nevus Right Upper Temple  Milia Left Lateral Eye  Seborrheic dermatitis temples, forehead, eyebrows  Continue ketoconazole 2% cream 1-2 times daily as needed.  Continue HC 2.5% cream 1-2 times daily for up to 4 days as needed for flares.  Related Medications ketoconazole (NIZORAL) 2 % cream Apply  to affected areas face and ears 1-2 times a day.  hydrocortisone 2.5 % cream APPLY TOPICALLY AS DIRECTED DAILY UP TO 4 DAYS PER WEEK TO AFFECTED AREA(S) OF FACE AND UNDER ARMS AS NEEDED FOR FLARES  AK (actinic keratosis) Right Shoulder x 1  Actinic keratoses are precancerous spots that appear secondary to cumulative UV radiation exposure/sun exposure over time. They are chronic with expected duration over 1 year. A portion of actinic keratoses will progress to squamous cell carcinoma of the skin. It is not possible to reliably predict which spots will progress to skin cancer and so treatment is recommended to prevent development of skin cancer.  Recommend daily broad spectrum sunscreen SPF 30+ to sun-exposed areas, reapply every 2 hours as needed.  Recommend staying in the shade or wearing long sleeves, sun glasses (UVA+UVB protection) and wide brim hats (4-inch brim around the entire circumference of the hat). Call for new or changing lesions.  Hypertrophic at right shoulder  Destruction of lesion - Right Shoulder x 1  Destruction method: cryotherapy   Informed consent: discussed and consent obtained   Lesion destroyed using liquid nitrogen: Yes   Region frozen until ice ball extended beyond lesion: Yes   Outcome: patient tolerated procedure well with no complications   Post-procedure details: wound care instructions given   Additional details:  Prior to procedure, discussed risks of blister formation, small wound, skin dyspigmentation, or rare scar following cryotherapy. Recommend Vaseline ointment to treated areas while healing.   Congenital non-neoplastic nevus right lower hip  Neurofibroma left post  shoulder, left lower back  Epidermal inclusion cyst right perirectal  Benign-appearing. Exam most consistent with an epidermal inclusion cyst. Discussed that a cyst is a benign growth that can grow over time and sometimes get irritated or inflamed. Recommend observation if it is  not bothersome. Discussed option of surgical excision to remove it if it is growing, symptomatic, or other changes noted. Please call for new or changing lesions so they can be evaluated.     Lentigines - Scattered tan macules - Due to sun exposure - Benign-appearing, observe - Recommend daily broad spectrum sunscreen SPF 30+ to sun-exposed areas, reapply every 2 hours as needed. - Call for any changes  Seborrheic Keratoses - Stuck-on, waxy, tan-brown papules and/or plaques  - Benign-appearing - Discussed benign etiology and prognosis. - Observe - Call for any changes  Melanocytic Nevi - Tan-brown and/or pink-flesh-colored symmetric macules and papules - Benign appearing on exam today - Observation - Call clinic for new or changing moles - Recommend daily use of broad spectrum spf 30+ sunscreen to sun-exposed areas.   Hemangiomas - Red papules - Discussed benign nature - Observe - Call for any changes  Actinic Damage - Chronic condition, secondary to cumulative UV/sun exposure - diffuse scaly erythematous macules with underlying dyspigmentation - Recommend daily broad spectrum sunscreen SPF 30+ to sun-exposed areas, reapply every 2 hours as needed.  - Staying in the shade or wearing long sleeves, sun glasses (UVA+UVB protection) and wide brim hats (4-inch brim around the entire circumference of the hat) are also recommended for sun protection.  - Call for new or changing lesions.  Skin cancer screening performed today.  Xerosis - diffuse xerotic patches - recommend gentle, hydrating skin care - gentle skin care handout given  History of Dysplastic Nevi - No evidence of recurrence today at right back, moderate - Recommend regular full body skin exams - Recommend daily broad spectrum sunscreen SPF 30+ to sun-exposed areas, reapply every 2 hours as needed.  - Call if any new or changing lesions are noted between office visits  History of Melanoma in Situ - No  evidence of recurrence today at left infraorbital cheek, Mohs 2019 - Recommend regular full body skin exams - Recommend daily broad spectrum sunscreen SPF 30+ to sun-exposed areas, reapply every 2 hours as needed.  - Call if any new or changing lesions are noted between office visits  History of Squamous Cell Carcinoma of the Skin - No evidence of recurrence today at right hand - No lymphadenopathy - Recommend regular full body skin exams - Recommend daily broad spectrum sunscreen SPF 30+ to sun-exposed areas, reapply every 2 hours as needed.  - Call if any new or changing lesions are noted between office visits  Acrochordons (Skin Tags) - Fleshy, skin-colored pedunculated papules - Benign appearing.  - Observe. - If desired, they can be removed with an in office procedure that is not covered by insurance. - Please call the clinic if you notice any new or changing lesions.  Purpura - Chronic; persistent and recurrent.  Treatable, but not curable. - Violaceous macules and patches - Benign - Related to trauma, age, sun damage and/or use of blood thinners, chronic use of topical and/or oral steroids - Observe - Can use OTC arnica containing moisturizer such as Dermend Bruise Formula if desired - Call for worsening or other concerns   No follow-ups on file.  Graciella Belton, RMA, am acting as scribe for [name], MD .

## 2022-07-10 NOTE — Patient Instructions (Addendum)
Cryotherapy Aftercare  Wash gently with soap and water everyday.   Apply Vaseline and Band-Aid daily until healed.   Start clindamycin gel to affected area a few times daily. Start doxycycline monohydrate 100 mg twice daily with food x 2 weeks and if improving decrease to once daily.  Continue hot compresses  Doxycycline should be taken with food to prevent nausea. Do not lay down for 30 minutes after taking. Be cautious with sun exposure and use good sun protection while on this medication. Pregnant women should not take this medication.    Melanoma ABCDEs  Melanoma is the most dangerous type of skin cancer, and is the leading cause of death from skin disease.  You are more likely to develop melanoma if you: Have light-colored skin, light-colored eyes, or red or blond hair Spend a lot of time in the sun Tan regularly, either outdoors or in a tanning bed Have had blistering sunburns, especially during childhood Have a close family member who has had a melanoma Have atypical moles or large birthmarks  Early detection of melanoma is key since treatment is typically straightforward and cure rates are extremely high if we catch it early.   The first sign of melanoma is often a change in a mole or a new dark spot.  The ABCDE system is a way of remembering the signs of melanoma.  A for asymmetry:  The two halves do not match. B for border:  The edges of the growth are irregular. C for color:  A mixture of colors are present instead of an even brown color. D for diameter:  Melanomas are usually (but not always) greater than 73m - the size of a pencil eraser. E for evolution:  The spot keeps changing in size, shape, and color.  Please check your skin once per month between visits. You can use a small mirror in front and a large mirror behind you to keep an eye on the back side or your body.   If you see any new or changing lesions before your next follow-up, please call to schedule a  visit.  Please continue daily skin protection including broad spectrum sunscreen SPF 30+ to sun-exposed areas, reapplying every 2 hours as needed when you're outdoors.    Due to recent changes in healthcare laws, you may see results of your pathology and/or laboratory studies on MyChart before the doctors have had a chance to review them. We understand that in some cases there may be results that are confusing or concerning to you. Please understand that not all results are received at the same time and often the doctors may need to interpret multiple results in order to provide you with the best plan of care or course of treatment. Therefore, we ask that you please give u[place]2 business days to thoroughly review all your results before contacting the office for clarification. Should we see a critical lab result, you will be contacted sooner.   If You Need Anything After Your Visit  If you have any questions or concerns for your doctor, please call our main line at [phone] press option 4 to reach your doctor's medical assistant. If no one answers, please leave a voicemail as directed and we will return your call as soon as possible. Messages left after 4 pm will be answered the following business day.   You may also send u[place] message via [place] We typically respond to MyChart messages within 1-2 business days.  For prescription refills,  please ask your pharmacy to contact our office. Our fax number is [phone].  If you have an urgent issue when the clinic is closed that cannot wait until the next business day, you can page your doctor at the number below.    Please note that while we do our best to be available for urgent issues outside of office hours, we are not available 24/7.   If you have an urgent issue and are unable to reach [place], you may choose to seek medical care at your doctor's office, retail clinic, urgent care center, or emergency room.  If you have a medical emergency,  please immediately call 911 or go to the emergency department.  Pager Numbers  - Dr. Nehemiah Massed: [phone]  - Dr. Laurence Ferrari: [phone]  - Dr. Nicole Kindred: [phone]  In the event of inclement weather, please call our main line at [phone] for an update on the status of any delays or closures.  Dermatology Medication Tips: Please keep the boxes that topical medications come in in order to help keep track of the instructions about where and how to use these. Pharmacies typically print the medication instructions only on the boxes and not directly on the medication tubes.   If your medication is too expensive, please contact our office at [phone] option 4 or send [place] a message through [employer].   We are unable to tell what your co-pay for medications will be in advance as this is different depending on your insurance coverage. However, we may be able to find a substitute medication at lower cost or fill out paperwork to get insurance to cover a needed medication.   If a prior authorization is required to get your medication covered by your insurance company, please allow [place] 1-2 business days to complete this process.  Drug prices often vary depending on where the prescription is filled and some pharmacies may offer cheaper prices.  The website www.goodrx.com contains coupons for medications through different pharmacies. The prices here do not account for what the cost may be with help from insurance (it may be cheaper with your insurance), but the website can give you the price if you did not use any insurance.  - You can print the associated coupon and take it with your prescription to the pharmacy.  - You may also stop by our office during regular business hours and pick up a GoodRx coupon card.  - If you need your prescription sent electronically to a different pharmacy, notify our office through [facility] or by phone at [phone] option 4.     Si Usted Necesita Algo  Despus de Su Visita  Tambin puede enviarnos un mensaje a travs de Pharmacist, community. Por lo general respondemos a los mensajes de MyChart en el transcurso de 1 a 2 das hbiles.  Para renovar recetas, por favor pida a su farmacia que se ponga en contacto con nuestra oficina. Harland Dingwall de fax es [place] [phone].  Si tiene un asunto urgente cuando la clnica est cerrada y que no puede esperar hasta el siguiente da hbil, puede llamar/localizar a su doctor(a) al nmero que aparece a continuacin.   Por favor, tenga en cuenta que aunque hacemos todo lo posible para estar disponibles para asuntos urgentes fuera del horario de [place], no estamos disponibles las 24 horas del da, los 7 das de la [place].   Si tiene un problema urgente y no puede comunicarse con nosotros, puede optar por buscar atencin Warden/ranger de  su doctor(a), en una clnica privada, en un centro de atencin urgente o en una sala de emergencias.  Si tiene Engineering geologist, por favor llame inmediatamente al 911 o vaya a la sala de emergencias.  Nmeros de bper  - Dr. Nehemiah Massed: [phone]  - Dra. Moye: [phone]  - Dra. Nicole Kindred: [phone]  En caso de inclemencias del [place], por favor llame a Johnsie Kindred principal al [phone] para una actualizacin sobre el [place] de cualquier retraso o cierre.  Consejos para la medicacin en dermatologa: Por favor, guarde las cajas en las que vienen los medicamentos de uso tpico para ayudarle a seguir las instrucciones sobre dnde y cmo usarlos. Las farmacias generalmente imprimen las instrucciones del medicamento slo en las cajas y no directamente en los tubos del [place].   Si su medicamento es muy caro, por favor, pngase en contacto con Zigmund Daniel llamando al [phone] y presione la opcin 4 o envenos un mensaje a travs de Pharmacist, community.   No podemos decirle cul ser su copago por los medicamentos por adelantado ya que esto es diferente  dependiendo de la cobertura de su seguro. Sin embargo, es posible que podamos encontrar un medicamento sustituto a Electrical engineer un formulario para que el seguro cubra el medicamento que se considera necesario.   Si se requiere una autorizacin previa para que su compaa de seguros [place] su medicamento, por favor permtanos de 1 a 2 das hbiles para completar este proceso.  Los precios de los medicamentos varan con frecuencia dependiendo del Environmental consultant de dnde se surte la receta y alguna farmacias pueden ofrecer precios ms baratos.  El sitio web www.goodrx.com tiene cupones para medicamentos de Airline pilot. Los precios aqu no tienen en cuenta lo que podra costar con la ayuda del seguro (puede ser ms barato con su seguro), pero el sitio web puede darle el precio si no utiliz Research scientist (physical sciences).  - Puede imprimir el cupn correspondiente y llevarlo con su receta a la farmacia.  - Tambin puede pasar por nuestra oficina durante el horario de atencin regular y Charity fundraiser una tarjeta de cupones de GoodRx.  - Si necesita que su receta se enve electrnicamente a una farmacia diferente, informe a nuestra oficina a travs de MyChart de [place] o por telfono llamando al [phone] y presione la opcin 4.

## 2022-07-11 DIAGNOSIS — R0602 Shortness of breath: Secondary | ICD-10-CM | POA: Diagnosis not present

## 2022-07-11 DIAGNOSIS — G4733 Obstructive sleep apnea (adult) (pediatric): Secondary | ICD-10-CM | POA: Diagnosis not present

## 2022-07-20 DIAGNOSIS — L720 Epidermal cyst: Secondary | ICD-10-CM | POA: Diagnosis not present

## 2022-07-28 DIAGNOSIS — Z23 Encounter for immunization: Secondary | ICD-10-CM | POA: Diagnosis not present

## 2022-08-17 ENCOUNTER — Other Ambulatory Visit: Payer: Self-pay | Admitting: Dermatology

## 2022-08-17 DIAGNOSIS — L219 Seborrheic dermatitis, unspecified: Secondary | ICD-10-CM

## 2022-08-30 DIAGNOSIS — G4733 Obstructive sleep apnea (adult) (pediatric): Secondary | ICD-10-CM | POA: Diagnosis not present

## 2022-09-08 DIAGNOSIS — Z23 Encounter for immunization: Secondary | ICD-10-CM | POA: Diagnosis not present

## 2022-09-20 ENCOUNTER — Encounter: Payer: Self-pay | Admitting: Dermatology

## 2022-09-20 ENCOUNTER — Ambulatory Visit (INDEPENDENT_AMBULATORY_CARE_PROVIDER_SITE_OTHER): Payer: Medicare Other | Admitting: Dermatology

## 2022-09-20 DIAGNOSIS — D225 Melanocytic nevi of trunk: Secondary | ICD-10-CM

## 2022-09-20 DIAGNOSIS — L578 Other skin changes due to chronic exposure to nonionizing radiation: Secondary | ICD-10-CM

## 2022-09-20 DIAGNOSIS — L821 Other seborrheic keratosis: Secondary | ICD-10-CM

## 2022-09-20 DIAGNOSIS — Z1283 Encounter for screening for malignant neoplasm of skin: Secondary | ICD-10-CM

## 2022-09-20 DIAGNOSIS — L820 Inflamed seborrheic keratosis: Secondary | ICD-10-CM | POA: Diagnosis not present

## 2022-09-20 DIAGNOSIS — D229 Melanocytic nevi, unspecified: Secondary | ICD-10-CM

## 2022-09-20 DIAGNOSIS — L814 Other melanin hyperpigmentation: Secondary | ICD-10-CM | POA: Diagnosis not present

## 2022-09-20 NOTE — Progress Notes (Signed)
   Follow-Up Visit   Subjective  Jesus Peterson is a 76 y.o. male who presents for the following: Nevus (Getting darker. Left upper chest. Growing. Hx of MM). Bothersome.  The patient has spots, moles and lesions to be evaluated, some may be new or changing and the patient has concerns that these could be cancer.   The following portions of the chart were reviewed this encounter and updated as appropriate:      Review of Systems: No other skin or systemic complaints except as noted in HPI or Assessment and Plan.   Objective  Well appearing patient in no apparent distress; mood and affect are within normal limits.  All skin waist up examined.  Left Clavicle x1 Erythematous keratotic or waxy stuck-on papule  left nasal dorsum Flesh colored papule   Assessment & Plan   Lentigines - Scattered tan macules - Due to sun exposure - Benign-appearing, observe - Recommend daily broad spectrum sunscreen SPF 30+ to sun-exposed areas, reapply every 2 hours as needed. - Call for any changes  Seborrheic Keratoses - Stuck-on, waxy, tan-brown papules and/or plaques  - Benign-appearing - Discussed benign etiology and prognosis. - Observe - Call for any changes  Melanocytic Nevi - Tan-brown and/or pink-flesh-colored symmetric macules and papules - Benign appearing on exam today - Observation - Call clinic for new or changing moles - Recommend daily use of broad spectrum spf 30+ sunscreen to sun-exposed areas.   Hemangiomas - Red papules - Discussed benign nature - Observe - Call for any changes  Actinic Damage - Chronic condition, secondary to cumulative UV/sun exposure - diffuse scaly erythematous macules with underlying dyspigmentation - Recommend daily broad spectrum sunscreen SPF 30+ to sun-exposed areas, reapply every 2 hours as needed.  - Staying in the shade or wearing long sleeves, sun glasses (UVA+UVB protection) and wide brim hats (4-inch brim around the entire  circumference of the hat) are also recommended for sun protection.  - Call for new or changing lesions.  Skin cancer screening performed today.  Inflamed seborrheic keratosis Left Clavicle x1  Symptomatic, irritating, patient would like treated.  Destruction of lesion - Left Clavicle x1  Destruction method: cryotherapy   Informed consent: discussed and consent obtained   Lesion destroyed using liquid nitrogen: Yes   Region frozen until ice ball extended beyond lesion: Yes   Outcome: patient tolerated procedure well with no complications   Post-procedure details: wound care instructions given   Additional details:  Prior to procedure, discussed risks of blister formation, small wound, skin dyspigmentation, or rare scar following cryotherapy. Recommend Vaseline ointment to treated areas while healing.   Nevus left nasal dorsum  Benign-appearing.  Observation.  Call clinic for new or changing lesions.  Recommend daily use of broad spectrum spf 30+ sunscreen to sun-exposed areas.   Discussed shave removal options.    Return for UBSE As Scheduled.  I, Emelia Salisbury, CMA, am acting as scribe for [name], MD.  Documentation: I have reviewed the above documentation for accuracy and completeness, and I agree with the above.  [name] MD

## 2022-09-20 NOTE — Patient Instructions (Signed)
Cryotherapy Aftercare  Wash gently with soap and water everyday.   Apply Vaseline daily until healed.   Recommend daily broad spectrum sunscreen SPF 30+ to sun-exposed areas, reapply every 2 hours as needed. Call for new or changing lesions.  Staying in the shade or wearing long sleeves, sun glasses (UVA+UVB protection) and wide brim hats (4-inch brim around the entire circumference of the hat) are also recommended for sun protection.     Due to recent changes in healthcare laws, you may see results of your pathology and/or laboratory studies on MyChart before the doctors have had a chance to review them. We understand that in some cases there may be results that are confusing or concerning to you. Please understand that not all results are received at the same time and often the doctors may need to interpret multiple results in order to provide you with the best plan of care or course of treatment. Therefore, we ask that you please give us 2 business days to thoroughly review all your results before contacting the office for clarification. Should we see a critical lab result, you will be contacted sooner.   If You Need Anything After Your Visit  If you have any questions or concerns for your doctor, please call our main line at [phone] and press option 4 to reach your doctor's medical assistant. If no one answers, please leave a voicemail as directed and we will return your call as soon as possible. Messages left after 4 pm will be answered the following business day.   You may also send us a message via MyChart. We typically respond to MyChart messages within 1-2 business days.  For prescription refills, please ask your pharmacy to contact our office. Our fax number is [phone].  If you have an urgent issue when the clinic is closed that cannot wait until the next business day, you can page your doctor at the number below.    Please note that while we do our best to be available for  urgent issues outside of office hours, we are not available 24/7.   If you have an urgent issue and are unable to reach us, you may choose to seek medical care at your doctor's office, retail clinic, urgent care center, or emergency room.  If you have a medical emergency, please immediately call 911 or go to the emergency department.  Pager Numbers  - Dr. Kowalski: [phone]  - Dr. Moye: [phone]  - Dr. Stewart: [phone]  In the event of inclement weather, please call our main line at [phone] for an update on the status of any delays or closures.  Dermatology Medication Tips: Please keep the boxes that topical medications come in in order to help keep track of the instructions about where and how to use these. Pharmacies typically print the medication instructions only on the boxes and not directly on the medication tubes.   If your medication is too expensive, please contact our office at [phone] option 4 or send us a message through MyChart.   We are unable to tell what your co-pay for medications will be in advance as this is different depending on your insurance coverage. However, we may be able to find a substitute medication at lower cost or fill out paperwork to get insurance to cover a needed medication.   If a prior authorization is required to get your medication covered by your insurance company, please allow us 1-2 business days to complete this process.  Drug prices   often vary depending on where the prescription is filled and some pharmacies may offer cheaper prices.  The website www.goodrx.com contains coupons for medications through different pharmacies. The prices here do not account for what the cost may be with help from insurance (it may be cheaper with your insurance), but the website can give you the price if you did not use any insurance.  - You can print the associated coupon and take it with your prescription to the pharmacy.  - You may also  stop by our office during regular business hours and pick up a GoodRx coupon card.  - If you need your prescription sent electronically to a different pharmacy, notify our office through [place] MyChart or by phone at [phone] option 4.     Si Usted Necesita Algo Despus de Su Visita  Tambin puede enviarnos un mensaje a travs de MyChart. Por lo general respondemos a los mensajes de MyChart en el transcurso de 1 a 2 das hbiles.  Para renovar recetas, por favor pida a su farmacia que se ponga en contacto con nuestra oficina. Nuestro nmero de fax es el [phone].  Si tiene un asunto urgente cuando la clnica est cerrada y que no puede esperar hasta el siguiente da hbil, puede llamar/localizar a su doctor(a) al nmero que aparece a continuacin.   Por favor, tenga en cuenta que aunque hacemos todo lo posible para estar disponibles para asuntos urgentes fuera del horario de oficina, no estamos disponibles las 24 horas del da, los 7 das de la semana.   Si tiene un problema urgente y no puede comunicarse con nosotros, puede optar por buscar atencin mdica  en el consultorio de su doctor(a), en una clnica privada, en un centro de atencin urgente o en una sala de emergencias.  Si tiene una emergencia mdica, por favor llame inmediatamente al 911 o vaya a la sala de emergencias.  Nmeros de bper  - Dr. Kowalski: [phone]  - Dra. Moye: [phone]  - Dra. Stewart: [phone]  En caso de inclemencias del tiempo, por favor llame a nuestra lnea principal al [phone] para una actualizacin sobre el estado de cualquier retraso o cierre.  Consejos para la medicacin en dermatologa: Por favor, guarde las cajas en las que vienen los medicamentos de uso tpico para ayudarle a seguir las instrucciones sobre dnde y cmo usarlos. Las farmacias generalmente imprimen las instrucciones del medicamento slo en las cajas y no directamente en los tubos del medicamento.   Si  su medicamento es muy caro, por favor, pngase en contacto con nuestra oficina llamando al [phone] y presione la opcin 4 o envenos un mensaje a travs de MyChart.   No podemos decirle cul ser su copago por los medicamentos por adelantado ya que esto es diferente dependiendo de la cobertura de su seguro. Sin embargo, es posible que podamos encontrar un medicamento sustituto a menor costo o llenar un formulario para que el seguro cubra el medicamento que se considera necesario.   Si se requiere una autorizacin previa para que su compaa de seguros cubra su medicamento, por favor permtanos de 1 a 2 das hbiles para completar este proceso.  Los precios de los medicamentos varan con frecuencia dependiendo del lugar de dnde se surte la receta y alguna farmacias pueden ofrecer precios ms baratos.  El sitio web www.goodrx.com tiene cupones para medicamentos de diferentes farmacias. Los precios aqu no tienen en cuenta lo que podra costar con la ayuda del seguro (puede ser ms barato con   su seguro), pero el sitio web puede darle el precio si no utiliz ningn seguro.  - Puede imprimir el cupn correspondiente y llevarlo con su receta a la farmacia.  - Tambin puede pasar por nuestra oficina durante el horario de atencin regular y recoger una tarjeta de cupones de GoodRx.  - Si necesita que su receta se enve electrnicamente a una farmacia diferente, informe a nuestra oficina a travs de MyChart de [place] o por telfono llamando al [phone] y presione la opcin 4.  

## 2022-10-12 DIAGNOSIS — E1169 Type 2 diabetes mellitus with other specified complication: Secondary | ICD-10-CM | POA: Diagnosis not present

## 2022-10-12 DIAGNOSIS — E782 Mixed hyperlipidemia: Secondary | ICD-10-CM | POA: Diagnosis not present

## 2022-11-14 ENCOUNTER — Other Ambulatory Visit: Payer: Self-pay

## 2022-11-14 DIAGNOSIS — L219 Seborrheic dermatitis, unspecified: Secondary | ICD-10-CM

## 2022-11-14 MED ORDER — HYDROCORTISONE 2.5 % EX CREA: TOPICAL_CREAM | CUTANEOUS | 1 refills | Status: DC

## 2022-11-14 NOTE — Progress Notes (Signed)
Pharmacy change requested from [place] to [facility]. Escripted

## 2022-12-20 ENCOUNTER — Ambulatory Visit (INDEPENDENT_AMBULATORY_CARE_PROVIDER_SITE_OTHER): Payer: Medicare Other | Admitting: Dermatology

## 2022-12-20 VITALS — BP 157/80 | HR 66

## 2022-12-20 DIAGNOSIS — L821 Other seborrheic keratosis: Secondary | ICD-10-CM

## 2022-12-20 DIAGNOSIS — L820 Inflamed seborrheic keratosis: Secondary | ICD-10-CM | POA: Diagnosis not present

## 2022-12-20 DIAGNOSIS — L814 Other melanin hyperpigmentation: Secondary | ICD-10-CM | POA: Diagnosis not present

## 2022-12-20 NOTE — Progress Notes (Signed)
   Follow-Up Visit   Subjective  Jesus Peterson is a 77 y.o. male who presents for the following: Growth (Right posterior thigh x 4 months, no symptoms.).   The following portions of the chart were reviewed this encounter and updated as appropriate:       Review of Systems:  No other skin or systemic complaints except as noted in HPI or Assessment and Plan.  Objective  Well appearing patient in no apparent distress; mood and affect are within normal limits.  A focused examination was performed including face, legs. Relevant physical exam findings are noted in the Assessment and Plan.  Right Posterior Thigh Erythematous stuck-on, waxy papule    Assessment & Plan  Seborrheic Keratoses - Stuck-on, waxy, tan-brown papules and/or plaques  - Benign-appearing - Discussed benign etiology and prognosis. - Observe - Call for any changes  Lentigines - Scattered tan macules - Due to sun exposure - Benign-appearing, observe - Recommend daily broad spectrum sunscreen SPF 30+ to sun-exposed areas, reapply every 2 hours as needed. - Call for any changes  Inflamed seborrheic keratosis Right Posterior Thigh  Symptomatic, irritating, patient would like treated.  Destruction of lesion - Right Posterior Thigh  Destruction method: cryotherapy   Informed consent: discussed and consent obtained   Lesion destroyed using liquid nitrogen: Yes   Region frozen until ice ball extended beyond lesion: Yes   Outcome: patient tolerated procedure well with no complications   Post-procedure details: wound care instructions given   Additional details:  Prior to procedure, discussed risks of blister formation, small wound, skin dyspigmentation, or rare scar following cryotherapy. Recommend Vaseline ointment to treated areas while healing.    Return as scheduled.  IJamesetta Orleans, CMA, am acting as scribe for [name], MD .  Documentation: I have reviewed the above documentation for accuracy  and completeness, and I agree with the above.  [name] MD

## 2022-12-20 NOTE — Patient Instructions (Signed)

## 2023-02-12 ENCOUNTER — Ambulatory Visit (INDEPENDENT_AMBULATORY_CARE_PROVIDER_SITE_OTHER): Payer: Medicare Other | Admitting: Dermatology

## 2023-02-12 ENCOUNTER — Other Ambulatory Visit: Payer: Self-pay

## 2023-02-12 DIAGNOSIS — L578 Other skin changes due to chronic exposure to nonionizing radiation: Secondary | ICD-10-CM

## 2023-02-12 DIAGNOSIS — Z86018 Personal history of other benign neoplasm: Secondary | ICD-10-CM

## 2023-02-12 DIAGNOSIS — L219 Seborrheic dermatitis, unspecified: Secondary | ICD-10-CM | POA: Diagnosis not present

## 2023-02-12 DIAGNOSIS — I720 Aneurysm of carotid artery: Secondary | ICD-10-CM

## 2023-02-12 DIAGNOSIS — D2239 Melanocytic nevi of other parts of face: Secondary | ICD-10-CM | POA: Diagnosis not present

## 2023-02-12 DIAGNOSIS — D3610 Benign neoplasm of peripheral nerves and autonomic nervous system, unspecified: Secondary | ICD-10-CM

## 2023-02-12 DIAGNOSIS — D1801 Hemangioma of skin and subcutaneous tissue: Secondary | ICD-10-CM

## 2023-02-12 DIAGNOSIS — L820 Inflamed seborrheic keratosis: Secondary | ICD-10-CM

## 2023-02-12 DIAGNOSIS — D229 Melanocytic nevi, unspecified: Secondary | ICD-10-CM

## 2023-02-12 DIAGNOSIS — D239 Other benign neoplasm of skin, unspecified: Secondary | ICD-10-CM

## 2023-02-12 DIAGNOSIS — D3617 Benign neoplasm of peripheral nerves and autonomic nervous system of trunk, unspecified: Secondary | ICD-10-CM | POA: Diagnosis not present

## 2023-02-12 DIAGNOSIS — L570 Actinic keratosis: Secondary | ICD-10-CM | POA: Diagnosis not present

## 2023-02-12 DIAGNOSIS — L814 Other melanin hyperpigmentation: Secondary | ICD-10-CM

## 2023-02-12 DIAGNOSIS — Q825 Congenital non-neoplastic nevus: Secondary | ICD-10-CM

## 2023-02-12 DIAGNOSIS — Z86006 Personal history of melanoma in-situ: Secondary | ICD-10-CM

## 2023-02-12 DIAGNOSIS — Z1283 Encounter for screening for malignant neoplasm of skin: Secondary | ICD-10-CM

## 2023-02-12 DIAGNOSIS — Z85828 Personal history of other malignant neoplasm of skin: Secondary | ICD-10-CM

## 2023-02-12 DIAGNOSIS — L821 Other seborrheic keratosis: Secondary | ICD-10-CM

## 2023-02-12 DIAGNOSIS — D2339 Other benign neoplasm of skin of other parts of face: Secondary | ICD-10-CM

## 2023-02-12 MED ORDER — KETOCONAZOLE 2 % EX CREA: TOPICAL_CREAM | CUTANEOUS | 11 refills | Status: DC

## 2023-02-12 NOTE — Progress Notes (Signed)
Follow-Up Visit   Subjective  Jesus Peterson is a 77 y.o. male who presents for the following: Skin Cancer Screening and Upper Body Skin Exam  The patient presents for Upper Body Skin Exam (UBSE) for skin cancer screening and mole check. The patient has spots, moles and lesions to be evaluated, some may be new or changing and the patient has concerns that these could be cancer.  Hx BCC, SCC, MMis and dysplastic nevi. Patient with a spot at right forearm, not bothersome for patient but it does not go away. Patient had ISK treated at right posterior thigh, some residual.   The following portions of the chart were reviewed this encounter and updated as appropriate: medications, allergies, medical history  Review of Systems:  No other skin or systemic complaints except as noted in HPI or Assessment and Plan.  Objective  Well appearing patient in no apparent distress; mood and affect are within normal limits.  All skin waist up examined. Relevant physical exam findings are noted in the Assessment and Plan.  residual at right posterior thigh x 1 Erythematous stuck-on, waxy papule  right mid forearm x 1, right hand dorsum x 1 (2) Erythematous thin papules/macules with gritty scale.   left sacral area Soft flesh papule  left occipital scalp 0.6 cm violaceous papule  left nasal dorsum 0.6 cm flesh papule, present for years without changes per pt  eyebrows, ears, forehead Erythema with extensive scale  right upper temple 1.5 blue macule  Right Lower Hip 6.0 x 3.0 cm speckled brown patch with hypertrichosis     Assessment & Plan   Inflamed seborrheic keratosis residual at right posterior thigh x 1  Symptomatic, irritating, patient would like treated.   Destruction of lesion - residual at right posterior thigh x 1  Destruction method: cryotherapy   Informed consent: discussed and consent obtained   Lesion destroyed using liquid nitrogen: Yes   Region frozen until ice  ball extended beyond lesion: Yes   Outcome: patient tolerated procedure well with no complications   Post-procedure details: wound care instructions given   Additional details:  Prior to procedure, discussed risks of blister formation, small wound, skin dyspigmentation, or rare scar following cryotherapy. Recommend Vaseline ointment to treated areas while healing.   AK (actinic keratosis) (2) right mid forearm x 1, right hand dorsum x 1  Actinic keratoses are precancerous spots that appear secondary to cumulative UV radiation exposure/sun exposure over time. They are chronic with expected duration over 1 year. A portion of actinic keratoses will progress to squamous cell carcinoma of the skin. It is not possible to reliably predict which spots will progress to skin cancer and so treatment is recommended to prevent development of skin cancer.  Recommend daily broad spectrum sunscreen SPF 30+ to sun-exposed areas, reapply every 2 hours as needed.  Recommend staying in the shade or wearing long sleeves, sun glasses (UVA+UVB protection) and wide brim hats (4-inch brim around the entire circumference of the hat). Call for new or changing lesions.  Hypertrophic at right mid forearm, right hand dorsum  Destruction of lesion - right mid forearm x 1, right hand dorsum x 1  Destruction method: cryotherapy   Informed consent: discussed and consent obtained   Lesion destroyed using liquid nitrogen: Yes   Region frozen until ice ball extended beyond lesion: Yes   Outcome: patient tolerated procedure well with no complications   Post-procedure details: wound care instructions given   Additional details:  Prior to procedure,  discussed risks of blister formation, small wound, skin dyspigmentation, or rare scar following cryotherapy. Recommend Vaseline ointment to treated areas while healing.   Neurofibroma left sacral area  Benign-appearing.  Observation.  Call clinic for new or changing lesions.     Hemangioma of skin left occipital scalp  Benign-appearing.  Observation.  Call clinic for new or changing lesions.     Nevus left nasal dorsum  Benign-appearing.  Stable. Observation.  Call clinic for new or changing lesions.      Seborrheic dermatitis eyebrows, ears, forehead  Chronic and persistent condition with duration or expected duration over one year. Condition is symptomatic / bothersome to patient. Not to goal.  Seborrheic Dermatitis  -  is a chronic persistent rash characterized by pinkness and scaling most commonly of the mid face but also can occur on the scalp (dandruff), ears; mid chest, mid back and groin.  It tends to be exacerbated by stress and cooler weather.  People who have neurologic disease may experience new onset or exacerbation of existing seborrheic dermatitis.  The condition is not curable but treatable and can be controlled.   increase ketoconazole 2% cream to nightly to affected areas. Continue HC 2.5% cream daily as needed for flares.   Related Medications ketoconazole (NIZORAL) 2 % cream Apply to affected areas face and ears 1-2 times a day.  hydrocortisone 2.5 % cream Apply topically as directed daily up to 4 days per week to affected areas of face and under arms as needed for flare.  Blue nevus right upper temple  Congenital non-neoplastic nevus Right Lower Hip  Benign-appearing. Stable compared to previous visit. Observation.  Call clinic for new or changing moles.  Recommend daily use of broad spectrum spf 30+ sunscreen to sun-exposed areas.     Lentigines, Seborrheic Keratoses, Hemangiomas - Benign normal skin lesions - Benign-appearing - Call for any changes  Melanocytic Nevi - Tan-brown and/or pink-flesh-colored symmetric macules and papules - Benign appearing on exam today - Observation - Call clinic for new or changing moles - Recommend daily use of broad spectrum spf 30+ sunscreen to sun-exposed areas.   Actinic  Damage - Chronic condition, secondary to cumulative UV/sun exposure - diffuse scaly erythematous macules with underlying dyspigmentation - Recommend daily broad spectrum sunscreen SPF 30+ to sun-exposed areas, reapply every 2 hours as needed.  - Staying in the shade or wearing long sleeves, sun glasses (UVA+UVB protection) and wide brim hats (4-inch brim around the entire circumference of the hat) are also recommended for sun protection.  - Call for new or changing lesions.  Skin cancer screening performed today.  HISTORY OF BASAL CELL CARCINOMA OF THE SKIN - No evidence of recurrence today - Recommend regular full body skin exams - Recommend daily broad spectrum sunscreen SPF 30+ to sun-exposed areas, reapply every 2 hours as needed.  - Call if any new or changing lesions are noted between office visits  History of Dysplastic Nevi - No evidence of recurrence today - Recommend regular full body skin exams - Recommend daily broad spectrum sunscreen SPF 30+ to sun-exposed areas, reapply every 2 hours as needed.  - Call if any new or changing lesions are noted between office visits  HISTORY OF SQUAMOUS CELL CARCINOMA OF THE SKIN - No evidence of recurrence today - Recommend regular full body skin exams - Recommend daily broad spectrum sunscreen SPF 30+ to sun-exposed areas, reapply every 2 hours as needed.  - Call if any new or changing lesions are noted between  office visits   HISTORY OF MELANOMA IN SITU - Mohs 2019 [facility] - No evidence of recurrence today L infraorbital cheek - Recommend regular full body skin exams - Recommend daily broad spectrum sunscreen SPF 30+ to sun-exposed areas, reapply every 2 hours as needed.  - Call if any new or changing lesions are noted between office visits  Purpura - Chronic; persistent and recurrent.  Treatable, but not curable. - Violaceous macules and patches - Benign - Related to trauma, age, sun damage and/or use of blood thinners, chronic use  of topical and/or oral steroids - Observe - Can use OTC arnica containing moisturizer such as Dermend Bruise Formula if desired - Call for worsening or other concerns  EPIDERMAL INCLUSION CYST Exam: blue firm papule at right upper arm  Benign-appearing. Exam most consistent with an epidermal inclusion cyst. Discussed that a cyst is a benign growth that can grow over time and sometimes get irritated or inflamed. Recommend observation if it is not bothersome. Discussed option of surgical excision to remove it if it is growing, symptomatic, or other changes noted. Please call for new or changing lesions so they can be evaluated.    Return in about 6 months (around 08/14/2023) for UBSE, Hx MMis, Hx BCC, Hx SCCis, Hx Dysplastic Nevi, Hx AK.  Anise Salvo, RMA, am acting as scribe for Willeen Niece, MD .   Documentation: I have reviewed the above documentation for accuracy and completeness, and I agree with the above.  Willeen Niece, MD

## 2023-02-12 NOTE — Patient Instructions (Addendum)
Cryotherapy Aftercare  Wash gently with soap and water everyday.   Apply Vaseline and Band-Aid daily until healed.    Melanoma ABCDEs  Melanoma is the most dangerous type of skin cancer, and is the leading cause of death from skin disease.  You are more likely to develop melanoma if you: Have light-colored skin, light-colored eyes, or red or blond hair Spend a lot of time in the sun Tan regularly, either outdoors or in a tanning bed Have had blistering sunburns, especially during childhood Have a close family member who has had a melanoma Have atypical moles or large birthmarks  Early detection of melanoma is key since treatment is typically straightforward and cure rates are extremely high if we catch it early.   The first sign of melanoma is often a change in a mole or a new dark spot.  The ABCDE system is a way of remembering the signs of melanoma.  A for asymmetry:  The two halves do not match. B for border:  The edges of the growth are irregular. C for color:  A mixture of colors are present instead of an even brown color. D for diameter:  Melanomas are usually (but not always) greater than 6mm - the size of a pencil eraser. E for evolution:  The spot keeps changing in size, shape, and color.  Please check your skin once per month between visits. You can use a small mirror in front and a large mirror behind you to keep an eye on the back side or your body.   If you see any new or changing lesions before your next follow-up, please call to schedule a visit.  Please continue daily skin protection including broad spectrum sunscreen SPF 30+ to sun-exposed areas, reapplying every 2 hours as needed when you're outdoors.    Due to recent changes in healthcare laws, you may see results of your pathology and/or laboratory studies on MyChart before the doctors have had a chance to review them. We understand that in some cases there may be results that are confusing or concerning to you.  Please understand that not all results are received at the same time and often the doctors may need to interpret multiple results in order to provide you with the best plan of care or course of treatment. Therefore, we ask that you please give us 2 business days to thoroughly review all your results before contacting the office for clarification. Should we see a critical lab result, you will be contacted sooner.   If You Need Anything After Your Visit  If you have any questions or concerns for your doctor, please call our main line at [phone] and press option 4 to reach your doctor's medical assistant. If no one answers, please leave a voicemail as directed and we will return your call as soon as possible. Messages left after 4 pm will be answered the following business day.   You may also send us a message via MyChart. We typically respond to MyChart messages within 1-2 business days.  For prescription refills, please ask your pharmacy to contact our office. Our fax number is [phone].  If you have an urgent issue when the clinic is closed that cannot wait until the next business day, you can page your doctor at the number below.    Please note that while we do our best to be available for urgent issues outside of office hours, we are not available 24/7.   If you have an urgent   issue and are unable to reach us, you may choose to seek medical care at your doctor's office, retail clinic, urgent care center, or emergency room.  If you have a medical emergency, please immediately call 911 or go to the emergency department.  Pager Numbers  - Dr. Kowalski: [phone]  - Dr. Moye: [phone]  - Dr. Stewart: [phone]  In the event of inclement weather, please call our main line at [phone] for an update on the status of any delays or closures.  Dermatology Medication Tips: Please keep the boxes that topical medications come in in order to help keep track of the  instructions about where and how to use these. Pharmacies typically print the medication instructions only on the boxes and not directly on the medication tubes.   If your medication is too expensive, please contact our office at [phone] option 4 or send us a message through MyChart.   We are unable to tell what your co-pay for medications will be in advance as this is different depending on your insurance coverage. However, we may be able to find a substitute medication at lower cost or fill out paperwork to get insurance to cover a needed medication.   If a prior authorization is required to get your medication covered by your insurance company, please allow us 1-2 business days to complete this process.  Drug prices often vary depending on where the prescription is filled and some pharmacies may offer cheaper prices.  The website www.goodrx.com contains coupons for medications through different pharmacies. The prices here do not account for what the cost may be with help from insurance (it may be cheaper with your insurance), but the website can give you the price if you did not use any insurance.  - You can print the associated coupon and take it with your prescription to the pharmacy.  - You may also stop by our office during regular business hours and pick up a GoodRx coupon card.  - If you need your prescription sent electronically to a different pharmacy, notify our office through [facility] MyChart or by phone at [phone] option 4.  

## 2023-05-16 ENCOUNTER — Ambulatory Visit: Admit: 2023-05-16 | Payer: Medicare Other | Admitting: Internal Medicine

## 2023-05-16 SURGERY — COLONOSCOPY WITH PROPOFOL
Anesthesia: General

## 2023-08-02 ENCOUNTER — Encounter: Payer: Self-pay | Admitting: Pulmonary Disease

## 2023-08-02 ENCOUNTER — Ambulatory Visit: Payer: Medicare Other | Admitting: Pulmonary Disease

## 2023-08-02 VITALS — BP 118/82 | HR 53 | Temp 98.2°F | Ht 71.0 in | Wt 226.0 lb

## 2023-08-02 DIAGNOSIS — R0600 Dyspnea, unspecified: Secondary | ICD-10-CM

## 2023-08-02 DIAGNOSIS — R0789 Other chest pain: Secondary | ICD-10-CM | POA: Diagnosis not present

## 2023-08-02 DIAGNOSIS — G4733 Obstructive sleep apnea (adult) (pediatric): Secondary | ICD-10-CM

## 2023-08-02 NOTE — Progress Notes (Signed)
Subjective:    Patient ID: Jesus Peterson, male    DOB: [birth year], 77 y.o.   MRN: [id number]  Patient Care Team: Danella Penton, MD as PCP - General (Internal Medicine) Debbe Odea, MD as PCP - Cardiology (Cardiology) Lamar Blinks, MD as Referring Physician (Internal Medicine) Scot Jun, MD (Inactive) (Gastroenterology) Debbrah Alar, MD (Dermatology)  Chief Complaint  Patient presents with   Consult    Diaphragm soreness in the morning and when he coughs. He does wear a CPAP.   BACKGROUND: Jesus Peterson is a 77 y.o. male with a history of of HTN, HLD, type II DM, obesity, OSA.  Who presents as a self-referral for "diaphragm soreness" noted in the AM's.  Dry cough as well.  HPI Discussed the use of AI scribe software for clinical note transcription with the patient, who gave verbal consent to proceed.  History of Present Illness   The patient, with a history of sleep apnea managed with CPAP, presents with a complaint of upper chest soreness, localized to the diaphragm area, for the past four to five months. The soreness is not associated with deep breathing but is noticeable upon movement, particularly when getting up from a lying position or during a cough. The patient denies any productive cough, fever, chills, or sweats. The discomfort is most pronounced in the morning, upon waking and moving to the bathroom, and gradually subsides thereafter. The patient also notes a similar sensation of soreness when rising from a lying position on the couch during the day.  The patient has been using the same CPAP machine for nearly five years and suspects that the chest discomfort may be related to the CPAP use. The patient has had difficulty tolerating the CPAP machine, often forgetting to put it on before sleep or after waking to use the bathroom. The patient has had a titration study years ago but has not had a recent one. The patient does not endorse a history of  asthma.  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 The patient denies any leg swelling but occasionally notices minimal swelling while sitting and watching TV.     CPAP download was reviewed: Period covered was 03 July 2019 24 through 01 August 2023 77% usage days (23/30) with 18 days over 4 hours (60%) and 5 days less than 4 hours (17%).  Machine is an AirSense 10 AutoSet pressure set at 4 cm H2O through 20 cm H2O pressures fluctuate from a 10 cm H2O to 15 cm H2O residual AHI is still 5.1.  No significant leaks noted.   Review of Systems A 10 point review of systems was performed and it is as noted above otherwise negative.   Past Medical History:  Diagnosis Date   Actinic keratosis 08/18/2010   Left forearm. Hypertrophic.   Cancer (HCC)    MELANOMA OF SKIN   Chronic kidney disease    Dysplastic nevus 07/06/2010   Right back. Moderate atypia.    Erectile dysfunction    History of kidney stones    Hyperlipidemia    Hypertension    Melanoma (HCC) 10/17/2017   L infraorbitial cheek, Melanoma IS txted with M[facility] Dr. Adriana Simas, pathology in media   Seborrheic dermatitis    Squamous cell carcinoma of skin ?   R hand, txted by Dr. Cheree Ditto   Treadmill  stress test negative for angina pectoris 2013   Dr. Gwen Pounds, normal per pt   Valvular heart disease    Venous angioma of brain (HCC)    Pons, found 2/2 tinnitus, followed on MRI    Past Surgical History:  Procedure Laterality Date   COLONOSCOPY  08/24/2006,08/23/2012   COLONOSCOPY WITH PROPOFOL N/A 09/09/2018   Procedure: COLONOSCOPY WITH PROPOFOL;  Surgeon: Christena Deem, MD;  Location: [facility] ENDOSCOPY;  Service: Endoscopy;  Laterality: N/A;    Patient Active Problem List   Diagnosis Date Noted   Acute right-sided low back pain without sciatica 08/13/2020    Atypical chest pain 07/12/2020   Vertigo 04/07/2020   Chronic kidney disease 10/08/2019   Eczema 09/23/2019   Chronic cough 07/23/2019   Skin infection 07/23/2019   Seborrheic dermatitis 07/22/2018   Prostate cancer screening 04/08/2018   Melanoma in situ of cheek (HCC) 12/24/2017   History of colon polyps 12/24/2017   History of malignant melanoma of skin 12/11/2017   Right upper quadrant abdominal pain 09/21/2017   Toenail deformity 06/26/2017   Hearing difficulty of both ears 03/26/2017   Left foot pain 02/05/2017   Diarrhea 02/05/2017   Neck pain 10/18/2016   Osteoarthritis of left hand 06/30/2016   Angioma 06/30/2016   Obstructive sleep apnea 07/03/2014   Valvular heart disease 05/11/2014   Nonspecific abnormal electrocardiogram (ECG) (EKG) 02/06/2014   Diabetes type 2, controlled (HCC) 08/11/2013   GERD (gastroesophageal reflux disease) 04/05/2012   Hypertension 03/04/2012   Erectile dysfunction 03/04/2012   Hyperlipidemia 03/04/2012    Family History  Problem Relation Age of Onset   Stroke Mother    Heart disease Mother        s/p CABG   Heart disease Father    Heart attack Sister    Heart disease Brother        s/p CABG   Heart disease Brother        s/p angioplasty   Arthritis Brother     Social History   Tobacco Use   Smoking status: Never   Smokeless tobacco: Never  Substance Use Topics   Alcohol use: No    Allergies  Allergen Reactions   Contrast Media [Iodinated Contrast Media]    Metformin Nausea Only and Other (See Comments)   Penicillins     Current Meds  Medication Sig   amLODipine (NORVASC) 10 MG tablet TAKE 1 TABLET EVERY DAY   blood glucose meter kit and supplies KIT Dispense based on patient and insurance preference. Check once daily fasting, Dx Code E11.9   Blood Pressure Monitoring (ADULT BLOOD PRESSURE CUFF LG) KIT Use daily to check blood pressure   carvedilol (COREG) 12.5 MG tablet TAKE 1 AND 1/2 TABLETS TWICE DAILY WITH MEALS    empagliflozin (JARDIANCE) 25 MG TABS tablet Take 12.5 mg by mouth daily.   GARLIC OIL PO Take 1 capsule by mouth 4 (four) times daily.   glucose blood (CONTOUR NEXT TEST) test strip USE  STRIP TO CHECK GLUCOSE TWICE DAILY E11.9.   hydrochlorothiazide (HYDRODIURIL) 12.5 MG tablet TAKE 1 TABLET EVERY DAY   hydrocortisone 2.5 % cream Apply topically as directed daily up to 4 days per week to affected areas of face and under arms as needed for flare.   ketoconazole (NIZORAL) 2 % cream Apply to affected areas face and ears 1-2 times a day.   losartan (COZAAR) 100 MG tablet Take 1 tablet (100 mg total) by mouth daily.   meclizine (ANTIVERT) 25 MG  tablet Take 1 tablet (25 mg total) by mouth 3 (three) times daily as needed for dizziness.   Microlet Lancets MISC USE TO TEST BLOOD SUGAR TWICE DAILY   rosuvastatin (CRESTOR) 20 MG tablet TAKE 1 TABLET EVERY DAY    Immunization History  Administered Date(s) Administered   Fluad Quad(high Dose 65+) 07/12/2020   Influenza Split 03/05/2011, 08/02/2012, 07/30/2014   Influenza, Mdck, Trivalent,PF 6+ MOS(egg free) 07/05/2023   Influenza, Quadrivalent, Recombinant, Inj, Pf 07/17/2019   Influenza,inj,Quad PF,6+ Mos 07/20/2017   Influenza-Unspecified 08/06/2015, 08/16/2016, 07/19/2018, 07/26/2019   Moderna Sars-Covid-2 Vaccination 02/25/2020, 03/24/2020, 09/23/2020   Pneumococcal Conjugate-13 11/07/2013   Pneumococcal Polysaccharide-23 03/05/2011        Objective:     BP 118/82 (BP Location: Right Arm, Cuff Size: Large)   Pulse (!) 53   Temp 98.2 F (36.8 C)   Ht 5\' 11"  (1.803 m)   Wt 226 lb (102.5 kg)   SpO2 97%   BMI 31.52 kg/m   SpO2: 97 % O2 Device: None (Room air)  GENERAL: Obese gentleman, no acute distress, fully ambulatory.  No conversational dyspnea. HEAD: Normocephalic, atraumatic.  EYES: Pupils equal, round, reactive to light.  No scleral icterus.  MOUTH: Nose/mouth/throat not examined due to institutional masking  requirements. NECK: Supple. No thyromegaly. Trachea midline. No JVD.  No adenopathy. PULMONARY: Good air entry bilaterally.  No adventitious sounds. CARDIOVASCULAR: S1 and S2. Regular rate and rhythm.  No rubs, murmurs or gallops heard. ABDOMEN: Obese, otherwise benign. MUSCULOSKELETAL: No joint deformity, no clubbing, no edema.  NEUROLOGIC: No overt focal deficit, no gait disturbance, speech is fluent. SKIN: Intact,warm,dry.  Mild stasis dermatitis changes lower extremities. PSYCH: Mood and behavior normal.  Chest x-ray obtained 05 July 2023 through [facility] reportedly no acute cardiopulmonary abnormality.   Assessment & Plan:     ICD-10-CM   1. Obstructive sleep apnea  G47.33 Cpap titration    2. Chest wall discomfort  R07.89 CBC w/Diff    Sedimentation rate    C-reactive protein    Renal Function Panel    3. Dyspnea, unspecified type  R06.00 Pulmonary Function Test [facility] Only    CBC w/Diff    Renal Function Panel      Orders Placed This Encounter  Procedures   CBC w/Diff    Standing Status:   Future    Standing Expiration Date:   08/01/2024   Sedimentation rate    Standing Status:   Future    Standing Expiration Date:   08/01/2024   C-reactive protein    Standing Status:   Future    Standing Expiration Date:   08/01/2024   Renal Function Panel    Standing Status:   Future    Standing Expiration Date:   08/01/2024   Pulmonary Function Test [facility] Only    Standing Status:   Future    Standing Expiration Date:   08/01/2024    Order Specific Question:   Full PFT: includes the following: basic spirometry, spirometry pre & post bronchodilator, diffusion capacity (DLCO), lung volumes    Answer:   Full PFT    Order Specific Question:   This test can only be performed at    Answer:   [facility]   Cpap titration    Titrate to BiPAP if needed.    Standing Status:   Future    Standing Expiration Date:   08/01/2024    Order Specific Question:   Where should  this test be performed:  Answer:   [place]   Assessment and Plan    Chest Wall Soreness Morning soreness in the upper chest area for the past 4-5 months. No associated dyspnea. Soreness also occurs with movement and coughing. No productive cough, fever, chills, or sweats. Suspected to be related to CPAP use. -Order complete blood count, ESR, CRP, and basic laboratory parameters to exclude arthritis and other inflammatory conditions. -Recommend formal titration study to assess need for BiPAP instead of CPAP due to high pressures.  CPAP Compliance Inconsistent use of CPAP machine, possibly due to discomfort from high pressures. Machine is set to auto mode. -Contact [facility] to obtain download of CPAP data to assess current settings. -Consider BiPAP if titration study indicates high pressures are causing discomfort and leading to non-compliance.  General Health Maintenance -Flu shot administered in September 2024.     Will see the patient in follow-up in 6 to 8 weeks time he is to contact us prior to that time should any new difficulties arise.  Gailen Shelter, MD Advanced Bronchoscopy PCCM [facility]    *This note was dictated using voice recognition software/Dragon.  Despite best efforts to proofread, errors can occur which can change the meaning. Any transcriptional errors that result from this process are unintentional and may not be fully corrected at the time of dictation.

## 2023-08-02 NOTE — Patient Instructions (Signed)
VISIT SUMMARY:  During your visit, we discussed your ongoing upper chest soreness, which you've been experiencing for the past four to five months. You suspect this discomfort may be related to your use of a CPAP machine for sleep apnea. We also discussed your inconsistent use of the CPAP machine, which may be due to discomfort from high pressures.  YOUR PLAN:  -CHEST WALL SORENESS: Your chest soreness might be due to inflammation or possibly related to your CPAP machine. We will conduct some blood tests to rule out arthritis and other inflammatory conditions. We also recommend a study to see if a different type of machine (BiPAP) might be more comfortable for you.  -CPAP COMPLIANCE: Your inconsistent use of the CPAP machine might be due to discomfort from high pressures. We will obtain data from your CPAP machine to assess its current settings. If the study shows that high pressures are causing discomfort, we might consider switching you to a BiPAP machine.  INSTRUCTIONS:  We will be ordering some blood tests to check for inflammation. We will also be contacting [facility] to obtain data from your CPAP machine. Please continue to use your CPAP machine as directed until we have more information. We will contact you with the results of these tests and next steps. Will see you in follow-up in 6 to 8 weeks time call sooner should any new problems arise. Once acute issues are resolved we will transition your care to one of our sleep providers.

## 2023-08-06 ENCOUNTER — Other Ambulatory Visit
Admission: RE | Admit: 2023-08-06 | Discharge: 2023-08-06 | Disposition: A | Discharge status: Home / Self Care | Payer: Medicare Other | Source: Ambulatory Visit | Attending: Pulmonary Disease | Admitting: Pulmonary Disease

## 2023-08-06 DIAGNOSIS — R0789 Other chest pain: Secondary | ICD-10-CM | POA: Insufficient documentation

## 2023-08-06 DIAGNOSIS — R0600 Dyspnea, unspecified: Secondary | ICD-10-CM | POA: Diagnosis present

## 2023-08-06 LAB — CBC WITH DIFFERENTIAL/PLATELET
Abs Immature Granulocytes: 0.03 10*3/uL (ref 0.00–0.07)
Basophils Absolute: 0 10*3/uL (ref 0.0–0.1)
Basophils Relative: 0 %
Eosinophils Absolute: 0.2 10*3/uL (ref 0.0–0.5)
Eosinophils Relative: 3 %
HCT: 47.9 % (ref 39.0–52.0)
Hemoglobin: 15.9 g/dL (ref 13.0–17.0)
Immature Granulocytes: 0 %
Lymphocytes Relative: 19 %
Lymphs Abs: 1.3 10*3/uL (ref 0.7–4.0)
MCH: 28.7 pg (ref 26.0–34.0)
MCHC: 33.2 g/dL (ref 30.0–36.0)
MCV: 86.5 fL (ref 80.0–100.0)
Monocytes Absolute: 0.6 10*3/uL (ref 0.1–1.0)
Monocytes Relative: 9 %
Neutro Abs: 4.7 10*3/uL (ref 1.7–7.7)
Neutrophils Relative %: 69 %
Platelets: 131 10*3/uL — ABNORMAL LOW (ref 150–400)
RBC: 5.54 MIL/uL (ref 4.22–5.81)
RDW: 14.3 % (ref 11.5–15.5)
WBC: 6.9 10*3/uL (ref 4.0–10.5)
nRBC: 0 % (ref 0.0–0.2)

## 2023-08-06 LAB — SEDIMENTATION RATE: Sed Rate: 2 mm/h (ref 0–20)

## 2023-08-06 LAB — RENAL FUNCTION PANEL
Albumin: 4.1 g/dL (ref 3.5–5.0)
Anion gap: 8 (ref 5–15)
BUN: 27 mg/dL — ABNORMAL HIGH (ref 8–23)
CO2: 24 mmol/L (ref 22–32)
Calcium: 9 mg/dL (ref 8.9–10.3)
Chloride: 106 mmol/L (ref 98–111)
Creatinine, Ser: 1.12 mg/dL (ref 0.61–1.24)
GFR, Estimated: >60 mL/min (ref >60)
Glucose, Bld: 158 mg/dL — ABNORMAL HIGH (ref 70–99)
Phosphorus: 3.4 mg/dL (ref 2.5–4.6)
Potassium: 3.9 mmol/L (ref 3.5–5.1)
Sodium: 138 mmol/L (ref 135–145)

## 2023-08-06 LAB — C-REACTIVE PROTEIN: CRP: 1.4 mg/dL — ABNORMAL HIGH (ref <1.0)

## 2023-08-06 NOTE — Addendum Note (Signed)
Addended by: Lonell Face C on: 08/06/2023 12:30 PM   Modules accepted: Orders

## 2023-08-07 ENCOUNTER — Telehealth: Payer: Self-pay | Admitting: Pulmonary Disease

## 2023-08-07 NOTE — Telephone Encounter (Signed)
We received a note from [facility] stating the patient has refused to schedule sleep study

## 2023-08-07 NOTE — Telephone Encounter (Signed)
Noted.  My ability to help him will be limited due to this.

## 2023-08-07 NOTE — Telephone Encounter (Signed)
Dr. Jayme Cloud will address at next appt on 09/20/23

## 2023-08-14 ENCOUNTER — Ambulatory Visit: Payer: Medicare Other | Admitting: Dermatology

## 2023-08-22 ENCOUNTER — Ambulatory Visit: Payer: Medicare Other | Admitting: Dermatology

## 2023-08-22 ENCOUNTER — Other Ambulatory Visit: Payer: Self-pay

## 2023-08-22 DIAGNOSIS — W908XXA Exposure to other nonionizing radiation, initial encounter: Secondary | ICD-10-CM | POA: Diagnosis not present

## 2023-08-22 DIAGNOSIS — D2261 Melanocytic nevi of right upper limb, including shoulder: Secondary | ICD-10-CM

## 2023-08-22 DIAGNOSIS — Q825 Congenital non-neoplastic nevus: Secondary | ICD-10-CM

## 2023-08-22 DIAGNOSIS — D3610 Benign neoplasm of peripheral nerves and autonomic nervous system, unspecified: Secondary | ICD-10-CM

## 2023-08-22 DIAGNOSIS — Z1283 Encounter for screening for malignant neoplasm of skin: Secondary | ICD-10-CM | POA: Diagnosis not present

## 2023-08-22 DIAGNOSIS — Z85828 Personal history of other malignant neoplasm of skin: Secondary | ICD-10-CM

## 2023-08-22 DIAGNOSIS — L814 Other melanin hyperpigmentation: Secondary | ICD-10-CM | POA: Diagnosis not present

## 2023-08-22 DIAGNOSIS — L821 Other seborrheic keratosis: Secondary | ICD-10-CM

## 2023-08-22 DIAGNOSIS — D2239 Melanocytic nevi of other parts of face: Secondary | ICD-10-CM

## 2023-08-22 DIAGNOSIS — L578 Other skin changes due to chronic exposure to nonionizing radiation: Secondary | ICD-10-CM

## 2023-08-22 DIAGNOSIS — D1801 Hemangioma of skin and subcutaneous tissue: Secondary | ICD-10-CM

## 2023-08-22 DIAGNOSIS — Z86018 Personal history of other benign neoplasm: Secondary | ICD-10-CM

## 2023-08-22 DIAGNOSIS — D3616 Benign neoplasm of peripheral nerves and autonomic nervous system of pelvis: Secondary | ICD-10-CM

## 2023-08-22 DIAGNOSIS — Z86006 Personal history of melanoma in-situ: Secondary | ICD-10-CM

## 2023-08-22 DIAGNOSIS — D229 Melanocytic nevi, unspecified: Secondary | ICD-10-CM

## 2023-08-22 DIAGNOSIS — L219 Seborrheic dermatitis, unspecified: Secondary | ICD-10-CM

## 2023-08-22 MED ORDER — PIMECROLIMUS 1 % EX CREA: TOPICAL_CREAM | CUTANEOUS | 3 refills | Status: AC

## 2023-08-22 MED ORDER — FLUOCINONIDE 0.05 % EX SOLN
CUTANEOUS | 2 refills | Status: DC
Start: 2023-08-22 — End: 2024-09-01

## 2023-08-22 MED ORDER — PIMECROLIMUS 1 % EX CREA
TOPICAL_CREAM | CUTANEOUS | 3 refills | Status: DC
Start: 2023-08-22 — End: 2023-08-22

## 2023-08-22 NOTE — Progress Notes (Signed)
Follow-Up Visit   Subjective  Jesus Peterson is a 77 y.o. male who presents for the following: Skin Cancer Screening and Upper Body Skin Exam  The patient presents for Upper Body Skin Exam (UBSE) for skin cancer screening and mole check. The patient has spots, moles and lesions to be evaluated, some may be new or changing and the patient may have concern these could be cancer. He has a couple spots to check on his chest and scalp. History of melanoma in situ of the left infraorbital cheek 2019. History of SCC of the right hand.    The following portions of the chart were reviewed this encounter and updated as appropriate: medications, allergies, medical history  Review of Systems:  No other skin or systemic complaints except as noted in HPI or Assessment and Plan.  Objective  Well appearing patient in no apparent distress; mood and affect are within normal limits.  All skin waist up examined. Relevant physical exam findings are noted in the Assessment and Plan.    Assessment & Plan    Skin cancer screening performed today.  Actinic Damage - Chronic condition, secondary to cumulative UV/sun exposure - diffuse scaly erythematous macules with underlying dyspigmentation - Recommend daily broad spectrum sunscreen SPF 30+ to sun-exposed areas, reapply every 2 hours as needed.  - Staying in the shade or wearing long sleeves, sun glasses (UVA+UVB protection) and wide brim hats (4-inch brim around the entire circumference of the hat) are also recommended for sun protection.  - Call for new or changing lesions.  Lentigines - Benign normal skin lesions - Benign-appearing - Call for any changes  SEBORRHEIC KERATOSIS - Stuck-on, waxy, tan-brown papules and/or plaques, including left chest  - Benign-appearing - Discussed benign etiology and prognosis. - Observe - Call for any changes  Melanocytic Nevi - Tan-brown and/or pink-flesh-colored symmetric macules and papules, including  right antecubitum - 0.6 cm flesh papule, present for years without changes per pt - Left nasal dorsum - 1.5 mm blue macule - Right upper temple - Benign appearing on exam today - Observation - Call clinic for new or changing moles - Recommend daily use of broad spectrum spf 30+ sunscreen to sun-exposed areas.   History of Dysplastic Nevi - No evidence of recurrence today - Recommend regular full body skin exams - Recommend daily broad spectrum sunscreen SPF 30+ to sun-exposed areas, reapply every 2 hours as needed.  - Call if any new or changing lesions are noted between office visits   HISTORY OF SQUAMOUS CELL CARCINOMA OF THE SKIN - No evidence of recurrence today - Recommend regular full body skin exams - Recommend daily broad spectrum sunscreen SPF 30+ to sun-exposed areas, reapply every 2 hours as needed.  - Call if any new or changing lesions are noted between office visits   HISTORY OF MELANOMA IN SITU - Mohs 2019 [facility] - No evidence of recurrence today L infraorbital cheek - Recommend regular full body skin exams - Recommend daily broad spectrum sunscreen SPF 30+ to sun-exposed areas, reapply every 2 hours as needed.  - Call if any new or changing lesions are noted between office visits   NEUROFIBROMA Soft flesh papule of the left sacral area. Benign, Observe.  HEMANGIOMA Exam: red papule(s); 0.6 cm violaceous papule of the left occipital scalp Discussed benign nature. Recommend observation. Call for changes.  CONGENITAL NEVUS Exam: 6.0 x 3.0 cm speckled brown patch with hypertrichosis of the right lower hip  Treatment Plan: Benign appearing on  exam today. Recommend observation. Call clinic for new or changing moles. Recommend daily use of broad spectrum spf 30+ sunscreen to sun-exposed areas.   SEBORRHEIC DERMATITIS Exam: Pink patches with greasy scale at glabella, eyebrows, alar creases, left parietal scalp, bilateral axilla  Chronic and persistent condition with  duration or expected duration over one year. Condition is bothersome/symptomatic for patient. Currently flared.   Seborrheic Dermatitis is a chronic persistent rash characterized by pinkness and scaling most commonly of the mid face but also can occur on the scalp (dandruff), ears; mid chest, mid back and groin.  It tends to be exacerbated by stress and cooler weather.  People who have neurologic disease may experience new onset or exacerbation of existing seborrheic dermatitis.  The condition is not curable but treatable and can be controlled.  Treatment Plan: Start Pimecrolimus Cream Apply to scaly areas face once to twice daily as needed dsp 60g 3Rf. (Can d/c hydrocortisone 2.5% cream) Continue Ketoconazole 2% Cream Apply to scaly areas face once to twice daily as needed. Pt has.  Continue OTC Head & Shoulders shampoo 2-3x per week, massage into scalp and let sit 3-5 minutes before rinsing. Start fluocinonide solution spot treat AA scalp once to twice daily as needed for itch. Avoid applying to face, groin, and axilla dsp 60mL 2Rf.. Use as directed. Long-term use can cause thinning of the skin. Samples of Zoryve cream and Vtama cream given, apply daily.  Not covered by insurance, so patient will see if [facility] will cover one of them.    Return in about 6 months (around 02/19/2024) for Hx melanoma IS, UBSE. Pt prefers to continue every 6 months. Wendee Beavers, CMA, am acting as scribe for Willeen Niece, MD .   Documentation: I have reviewed the above documentation for accuracy and completeness, and I agree with the above.  Willeen Niece, MD

## 2023-08-22 NOTE — Patient Instructions (Addendum)
Start Pimecrolimus Cream - Apply to scaly areas face and underarms once to twice daily as needed. Continue Ketoconazole 2% Cream - Apply to scaly areas face once to twice daily as needed.  Continue OTC Head & Shoulders shampoo 2-3x per week, massage into scalp and let sit 3-5 minutes before rinsing. Start fluocinonide solution - spot treat itchy areas on scalp once to twice daily as needed. Avoid applying to face, groin, and axilla. Use as directed. Long-term use can cause thinning of the skin.  Seborrheic Keratosis  What causes seborrheic keratoses? Seborrheic keratoses are harmless, common skin growths that first appear during adult life.  As time goes by, more growths appear.  Some people may develop a large number of them.  Seborrheic keratoses appear on both covered and uncovered body parts.  They are not caused by sunlight.  The tendency to develop seborrheic keratoses can be inherited.  They vary in color from skin-colored to gray, brown, or even black.  They can be either smooth or have a rough, warty surface.   Seborrheic keratoses are superficial and look as if they were stuck on the skin.  Under the microscope this type of keratosis looks like layers upon layers of skin.  That is why at times the top layer may seem to fall off, but the rest of the growth remains and re-grows.    Treatment Seborrheic keratoses do not need to be treated, but can easily be removed in the office.  Seborrheic keratoses often cause symptoms when they rub on clothing or jewelry.  Lesions can be in the way of shaving.  If they become inflamed, they can cause itching, soreness, or burning.  Removal of a seborrheic keratosis can be accomplished by freezing, burning, or surgery. If any spot bleeds, scabs, or grows rapidly, please return to have it checked, as these can be an indication of a skin cancer.  Melanoma ABCDEs  Melanoma is the most dangerous type of skin cancer, and is the leading cause of death from skin  disease.  You are more likely to develop melanoma if you: Have light-colored skin, light-colored eyes, or red or blond hair Spend a lot of time in the sun Tan regularly, either outdoors or in a tanning bed Have had blistering sunburns, especially during childhood Have a close family member who has had a melanoma Have atypical moles or large birthmarks  Early detection of melanoma is key since treatment is typically straightforward and cure rates are extremely high if we catch it early.   The first sign of melanoma is often a change in a mole or a new dark spot.  The ABCDE system is a way of remembering the signs of melanoma.  A for asymmetry:  The two halves do not match. B for border:  The edges of the growth are irregular. C for color:  A mixture of colors are present instead of an even brown color. D for diameter:  Melanomas are usually (but not always) greater than 6mm - the size of a pencil eraser. E for evolution:  The spot keeps changing in size, shape, and color.  Please check your skin once per month between visits. You can use a small mirror in front and a large mirror behind you to keep an eye on the back side or your body.   If you see any new or changing lesions before your next follow-up, please call to schedule a visit.  Please continue daily skin protection including broad spectrum sunscreen  SPF 30+ to sun-exposed areas, reapplying every 2 hours as needed when you're outdoors.   Staying in the shade or wearing long sleeves, sun glasses (UVA+UVB protection) and wide brim hats (4-inch brim around the entire circumference of the hat) are also recommended for sun protection.    Due to recent changes in healthcare laws, you may see results of your pathology and/or laboratory studies on MyChart before the doctors have had a chance to review them. We understand that in some cases there may be results that are confusing or concerning to you. Please understand that not all results  are received at the same time and often the doctors may need to interpret multiple results in order to provide you with the best plan of care or course of treatment. Therefore, we ask that you please give [place] 2 business days to thoroughly review all your results before contacting the office for clarification. Should we see a critical lab result, you will be contacted sooner.   If You Need Anything After Your Visit  If you have any questions or concerns for your doctor, please call our main line at [phone] and press option 4 to reach your doctor's medical assistant. If no one answers, please leave a voicemail as directed and we will return your call as soon as possible. Messages left after 4 pm will be answered the following business day.   You may also send [place] a message via MyChart. We typically respond to MyChart messages within 1-2 business days.  For prescription refills, please ask your pharmacy to contact our office. Our fax number is [phone].  If you have an urgent issue when the clinic is closed that cannot wait until the next business day, you can page your doctor at the number below.    Please note that while we do our best to be available for urgent issues outside of office hours, we are not available 24/7.   If you have an urgent issue and are unable to reach [place], you may choose to seek medical care at your doctor's office, retail clinic, urgent care center, or emergency room.  If you have a medical emergency, please immediately call 911 or go to the emergency department.  Pager Numbers  - Dr. Gwen Pounds: [phone]  - Dr. Roseanne Reno: [phone]  - Dr. Katrinka Blazing: [phone]   In the event of inclement weather, please call our main line at [phone] for an update on the status of any delays or closures.  Dermatology Medication Tips: Please keep the boxes that topical medications come in in order to help keep track of the instructions about where and how to use these.  Pharmacies typically print the medication instructions only on the boxes and not directly on the medication tubes.   If your medication is too expensive, please contact our office at [phone] option 4 or send [place] a message through MyChart.   We are unable to tell what your co-pay for medications will be in advance as this is different depending on your insurance coverage. However, we may be able to find a substitute medication at lower cost or fill out paperwork to get insurance to cover a needed medication.   If a prior authorization is required to get your medication covered by your insurance company, please allow [place] 1-2 business days to complete this process.  Drug prices often vary depending on where the prescription is filled and some pharmacies may offer cheaper prices.  The website www.goodrx.com contains coupons for medications through  different pharmacies. The prices here do not account for what the cost may be with help from insurance (it may be cheaper with your insurance), but the website can give you the price if you did not use any insurance.  - You can print the associated coupon and take it with your prescription to the pharmacy.  - You may also stop by our office during regular business hours and pick up a GoodRx coupon card.  - If you need your prescription sent electronically to a different pharmacy, notify our office through [facility] or by phone at [phone] option 4.     Si Usted Necesita Algo Despus de Su Visita  Tambin puede enviarnos un mensaje a travs de Clinical cytogeneticist. Por lo general respondemos a los mensajes de MyChart en el transcurso de 1 a 2 das hbiles.  Para renovar recetas, por favor pida a su farmacia que se ponga en contacto con nuestra oficina. Annie Sable de fax es [place] [phone].  Si tiene un asunto urgente cuando la clnica est cerrada y que no puede esperar hasta el siguiente da hbil, puede llamar/localizar a su doctor(a) al nmero  que aparece a continuacin.   Por favor, tenga en cuenta que aunque hacemos todo lo posible para estar disponibles para asuntos urgentes fuera del horario de [place], no estamos disponibles las 24 horas del da, los 7 [address]  [address] de la [place].   Si tiene un problema urgente y no puede comunicarse con nosotros, puede optar por buscar atencin mdica  en el consultorio de su doctor(a), en una clnica privada, en un centro de atencin urgente o en una sala de emergencias.  Si tiene Engineer, drilling, por favor llame inmediatamente al 911 o vaya a la sala de emergencias.  Nmeros de bper  - Dr. Gwen Pounds: [phone]  - Dra. Roseanne Reno: [phone]  - Dr. Katrinka Blazing: [phone]   En caso de inclemencias del tiempo, por favor llame a Lacy Duverney principal al [phone] para una actualizacin sobre el [place] de cualquier retraso o cierre.  Consejos para la medicacin en dermatologa: Por favor, guarde las cajas en las que vienen los medicamentos de uso tpico para ayudarle a seguir las instrucciones sobre dnde y cmo usarlos. Las farmacias generalmente imprimen las instrucciones del medicamento slo en las cajas y no directamente en los tubos del [place].   Si su medicamento es muy caro, por favor, pngase en contacto con Rolm Gala llamando al [phone] y presione la opcin 4 o envenos un mensaje a travs de Clinical cytogeneticist.   No podemos decirle cul ser su copago por los medicamentos por adelantado ya que esto es diferente dependiendo de la cobertura de su seguro. Sin embargo, es posible que podamos encontrar un medicamento sustituto a Audiological scientist un formulario para que el seguro cubra el medicamento que se considera necesario.   Si se requiere una autorizacin previa para que su compaa de seguros [place] su medicamento, por favor permtanos de 1 a 2 das hbiles para completar [address]  Los precios de los medicamentos varan con frecuencia dependiendo del Environmental consultant de dnde se  surte la receta y alguna farmacias pueden ofrecer precios ms baratos.  El sitio web www.goodrx.com tiene cupones para medicamentos de Health and safety inspector. Los precios aqu no tienen en cuenta lo que podra costar con la ayuda del seguro (puede ser ms barato con su seguro), pero el sitio web puede darle el precio si no utiliz Tourist information centre manager.  - Puede imprimir el cupn correspondiente y llevarlo  con su receta a la farmacia.  - Tambin puede pasar por nuestra oficina durante el horario de atencin regular y Education officer, museum una tarjeta de cupones de GoodRx.  - Si necesita que su receta se enve electrnicamente a una farmacia diferente, informe a nuestra oficina a travs de MyChart de [place] o por telfono llamando al [phone] y presione la opcin 4.

## 2023-09-11 ENCOUNTER — Ambulatory Visit: Payer: Medicare Other | Attending: Pulmonary Disease

## 2023-09-11 DIAGNOSIS — R0600 Dyspnea, unspecified: Secondary | ICD-10-CM | POA: Insufficient documentation

## 2023-09-11 LAB — PULMONARY FUNCTION TEST ARMC ONLY
DL/VA % pred: 102 %
DL/VA: 3.99 ml/min/mmHg/L
DLCO unc % pred: 105 %
DLCO unc: 26.93 ml/min/mmHg
FEF 25-75 Post: 2.99 L/s
FEF 25-75 Pre: 3.55 L/s
FEF2575-%Change-Post: -15 %
FEF2575-%Pred-Post: 137 %
FEF2575-%Pred-Pre: 163 %
FEV1-%Change-Post: -1 %
FEV1-%Pred-Post: 105 %
FEV1-%Pred-Pre: 107 %
FEV1-Post: 3.25 L
FEV1-Pre: 3.31 L
FEV1FVC-%Change-Post: -1 %
FEV1FVC-%Pred-Pre: 112 %
FEV6-%Change-Post: 0 %
FEV6-%Pred-Post: 100 %
FEV6-%Pred-Pre: 100 %
FEV6-Post: 4.03 L
FEV6-Pre: 4.04 L
FEV6FVC-%Change-Post: 0 %
FEV6FVC-%Pred-Post: 106 %
FEV6FVC-%Pred-Pre: 105 %
FVC-%Change-Post: 0 %
FVC-%Pred-Post: 94 %
FVC-%Pred-Pre: 95 %
FVC-Post: 4.05 L
FVC-Pre: 4.07 L
Post FEV1/FVC ratio: 80 %
Post FEV6/FVC ratio: 100 %
Pre FEV1/FVC ratio: 81 %
Pre FEV6/FVC Ratio: 99 %

## 2023-09-11 MED ORDER — ALBUTEROL SULFATE (2.5 MG/3ML) 0.083% IN NEBU
2.5000 mg | INHALATION_SOLUTION | Freq: Once | RESPIRATORY_TRACT | Status: AC
  Administered 2023-09-11: 2.5 mg via RESPIRATORY_TRACT
  Filled 2023-09-11: qty 3

## 2023-09-20 ENCOUNTER — Ambulatory Visit: Payer: Medicare Other | Admitting: Pulmonary Disease

## 2023-09-26 ENCOUNTER — Other Ambulatory Visit
Admission: RE | Admit: 2023-09-26 | Discharge: 2023-09-26 | Disposition: A | Discharge status: Home / Self Care | Payer: Medicare Other | Source: Ambulatory Visit | Attending: Specialist | Admitting: Specialist

## 2023-09-26 DIAGNOSIS — R0789 Other chest pain: Secondary | ICD-10-CM | POA: Insufficient documentation

## 2023-09-26 LAB — D-DIMER, QUANTITATIVE: D-Dimer, Quant: 0.32 ug{FEU}/mL (ref 0.00–0.50)

## 2023-10-30 ENCOUNTER — Ambulatory Visit (INDEPENDENT_AMBULATORY_CARE_PROVIDER_SITE_OTHER): Payer: Medicare Other | Admitting: Dermatology

## 2023-10-30 DIAGNOSIS — L814 Other melanin hyperpigmentation: Secondary | ICD-10-CM | POA: Diagnosis not present

## 2023-10-30 DIAGNOSIS — L820 Inflamed seborrheic keratosis: Secondary | ICD-10-CM | POA: Diagnosis not present

## 2023-10-30 DIAGNOSIS — W908XXA Exposure to other nonionizing radiation, initial encounter: Secondary | ICD-10-CM | POA: Diagnosis not present

## 2023-10-30 DIAGNOSIS — L578 Other skin changes due to chronic exposure to nonionizing radiation: Secondary | ICD-10-CM | POA: Diagnosis not present

## 2023-10-30 DIAGNOSIS — L821 Other seborrheic keratosis: Secondary | ICD-10-CM

## 2023-10-30 DIAGNOSIS — R2232 Localized swelling, mass and lump, left upper limb: Secondary | ICD-10-CM

## 2023-10-30 DIAGNOSIS — Z86006 Personal history of melanoma in-situ: Secondary | ICD-10-CM

## 2023-10-30 NOTE — Patient Instructions (Addendum)

## 2023-10-30 NOTE — Progress Notes (Signed)
   Follow-Up Visit   Subjective  Jesus Peterson is a 78 y.o. male who presents for the following: spot on the back, present for many months, more scaly, has gotten darker. He also has a nodule on the left elbow, present for years with no changes.   The patient has spots, moles and lesions to be evaluated, some may be new or changing and the patient may have concern these could be cancer.   The following portions of the chart were reviewed this encounter and updated as appropriate: medications, allergies, medical history  Review of Systems:  No other skin or systemic complaints except as noted in HPI or Assessment and Plan.  Objective  Well appearing patient in no apparent distress; mood and affect are within normal limits.  A focused examination was performed of the following areas: All skin waist up  Relevant physical exam findings are noted in the Assessment and Plan.  Upper Back x 2 (2) Erythematous stuck-on, waxy papule or plaque  Assessment & Plan  ACTINIC DAMAGE - chronic, secondary to cumulative UV radiation exposure/sun exposure over time - diffuse scaly erythematous macules with underlying dyspigmentation - Recommend daily broad spectrum sunscreen SPF 30+ to sun-exposed areas, reapply every 2 hours as needed.  - Recommend staying in the shade or wearing long sleeves, sun glasses (UVA+UVB protection) and wide brim hats (4-inch brim around the entire circumference of the hat). - Call for new or changing lesions.  HISTORY OF MELANOMA IN SITU Left infraorbital cheek, 2019 - No evidence of recurrence today - Recommend regular full body skin exams - Recommend daily broad spectrum sunscreen SPF 30+ to sun-exposed areas, reapply every 2 hours as needed.  - Call if any new or changing lesions are noted between office visits  LENTIGINES Exam: scattered tan macules Due to sun exposure Treatment Plan: Benign-appearing, observe. Recommend daily broad spectrum sunscreen SPF 30+  to sun-exposed areas, reapply every 2 hours as needed.  Call for any changes  SEBORRHEIC KERATOSIS - Stuck-on, waxy, tan-brown papules and/or plaques  - Benign-appearing - Discussed benign etiology and prognosis. - Observe - Call for any changes  Lipoma vs Bursa vs Ganglion Cyst Exam: Adherent subcutaneous rubbery nodule, 3.0 cm  Location: left elbow  Benign-appearing. Stable per patient. Occasionally symptomatic. Observation.  If patient wants removed, recommend orthopedic surgeon.   INFLAMED SEBORRHEIC KERATOSIS (2) Upper Back x 2 (2) Symptomatic, irritating, patient would like treated. Destruction of lesion - Upper Back x 2 (2)  Destruction method: cryotherapy   Informed consent: discussed and consent obtained   Lesion destroyed using liquid nitrogen: Yes   Region frozen until ice ball extended beyond lesion: Yes   Outcome: patient tolerated procedure well with no complications   Post-procedure details: wound care instructions given   Additional details:  Prior to procedure, discussed risks of blister formation, small wound, skin dyspigmentation, or rare scar following cryotherapy. Recommend Vaseline ointment to treated areas while healing.    Return as scheduled.  IAndrea Kerns, CMA, am acting as scribe for Rexene Rattler, MD .   Documentation: I have reviewed the above documentation for accuracy and completeness, and I agree with the above.  Rexene Rattler, MD

## 2024-01-02 ENCOUNTER — Encounter: Payer: Self-pay | Admitting: Dermatology

## 2024-01-02 ENCOUNTER — Encounter: Payer: Self-pay | Admitting: Internal Medicine

## 2024-01-09 ENCOUNTER — Encounter: Payer: Self-pay | Admitting: Internal Medicine

## 2024-01-09 ENCOUNTER — Encounter
Admission: RE | Disposition: A | Discharge status: Home / Self Care | Payer: Self-pay | Source: Ambulatory Visit | Attending: Internal Medicine

## 2024-01-09 ENCOUNTER — Ambulatory Visit: Admitting: Anesthesiology

## 2024-01-09 ENCOUNTER — Ambulatory Visit
Admission: RE | Admit: 2024-01-09 | Discharge: 2024-01-09 | Disposition: A | Discharge status: Home / Self Care | Payer: Medicare Other | Source: Ambulatory Visit | Attending: Internal Medicine | Admitting: Internal Medicine

## 2024-01-09 DIAGNOSIS — I129 Hypertensive chronic kidney disease with stage 1 through stage 4 chronic kidney disease, or unspecified chronic kidney disease: Secondary | ICD-10-CM | POA: Insufficient documentation

## 2024-01-09 DIAGNOSIS — G4730 Sleep apnea, unspecified: Secondary | ICD-10-CM | POA: Insufficient documentation

## 2024-01-09 DIAGNOSIS — I071 Rheumatic tricuspid insufficiency: Secondary | ICD-10-CM | POA: Insufficient documentation

## 2024-01-09 DIAGNOSIS — D122 Benign neoplasm of ascending colon: Secondary | ICD-10-CM | POA: Diagnosis not present

## 2024-01-09 DIAGNOSIS — I739 Peripheral vascular disease, unspecified: Secondary | ICD-10-CM | POA: Insufficient documentation

## 2024-01-09 DIAGNOSIS — K640 First degree hemorrhoids: Secondary | ICD-10-CM | POA: Diagnosis not present

## 2024-01-09 DIAGNOSIS — N189 Chronic kidney disease, unspecified: Secondary | ICD-10-CM | POA: Diagnosis not present

## 2024-01-09 DIAGNOSIS — K5730 Diverticulosis of large intestine without perforation or abscess without bleeding: Secondary | ICD-10-CM | POA: Insufficient documentation

## 2024-01-09 DIAGNOSIS — Z1211 Encounter for screening for malignant neoplasm of colon: Secondary | ICD-10-CM | POA: Insufficient documentation

## 2024-01-09 HISTORY — PX: POLYPECTOMY: SHX5525

## 2024-01-09 HISTORY — PX: COLONOSCOPY WITH PROPOFOL: SHX5780

## 2024-01-09 SURGERY — COLONOSCOPY WITH PROPOFOL
Anesthesia: General

## 2024-01-09 MED ORDER — PROPOFOL 500 MG/50ML IV EMUL
INTRAVENOUS | Status: DC | PRN
  Administered 2024-01-09: 100 ug/kg/min via INTRAVENOUS

## 2024-01-09 MED ORDER — STERILE WATER FOR IRRIGATION IR SOLN
Status: DC | PRN
  Administered 2024-01-09: 60 mL

## 2024-01-09 MED ORDER — SODIUM CHLORIDE 0.9 % IV SOLN
INTRAVENOUS | Status: DC
  Administered 2024-01-09: 500 mL via INTRAVENOUS

## 2024-01-09 MED ORDER — PROPOFOL 10 MG/ML IV BOLUS
INTRAVENOUS | Status: DC | PRN
  Administered 2024-01-09: 60 mg via INTRAVENOUS
  Administered 2024-01-09 (×2): 20 mg via INTRAVENOUS

## 2024-01-09 MED ORDER — LIDOCAINE HCL (CARDIAC) PF 100 MG/5ML IV SOSY
PREFILLED_SYRINGE | INTRAVENOUS | Status: DC | PRN
  Administered 2024-01-09: 60 mg via INTRAVENOUS

## 2024-01-09 MED ORDER — LIDOCAINE HCL (PF) 2 % IJ SOLN
INTRAMUSCULAR | Status: AC
  Filled 2024-01-09: qty 5

## 2024-01-09 NOTE — Anesthesia Preprocedure Evaluation (Signed)
 Anesthesia Evaluation  Patient identified by MRN, date of birth, ID band Patient awake    Reviewed: Allergy & Precautions, NPO status , Patient's Chart, lab work & pertinent test results  History of Anesthesia Complications Negative for: history of anesthetic complications  Airway Mallampati: III  TM Distance: >3 FB Neck ROM: full    Dental  (+) Chipped   Pulmonary sleep apnea and Continuous Positive Airway Pressure Ventilation    Pulmonary exam normal        Cardiovascular hypertension, + Peripheral Vascular Disease  Normal cardiovascular exam(-) dysrhythmias + Valvular Problems/Murmurs (TR)      Neuro/Psych negative neurological ROS  negative psych ROS   GI/Hepatic Neg liver ROS,GERD  ,,  Endo/Other  negative endocrine ROS    Renal/GU CRFRenal disease  negative genitourinary   Musculoskeletal   Abdominal   Peds  Hematology negative hematology ROS (+)   Anesthesia Other Findings Past Medical History: 08/18/2010: Actinic keratosis     Comment:  Left forearm. Hypertrophic. No date: Cancer [place])     Comment:  MELANOMA OF SKIN No date: Chronic kidney disease 07/06/2010: Dysplastic nevus     Comment:  Right back. Moderate atypia.  No date: Erectile dysfunction No date: History of kidney stones No date: Hyperlipidemia No date: Hypertension 10/17/2017: Melanoma (HCC)     Comment:  L infraorbitial cheek, Melanoma IS txted with [facility] Dr.               Adriana Simas, pathology in media No date: Seborrheic dermatitis ?: Squamous cell carcinoma of skin     Comment:  R hand, txted by Dr. Cheree Ditto 2013: Treadmill stress test negative for angina pectoris     Comment:  Dr. Gwen Pounds, normal per pt No date: Valvular heart disease No date: Venous angioma of brain (HCC)     Comment:  Pons, found 2/2 tinnitus, followed on MRI  Past Surgical History: 08/24/2006,08/23/2012: COLONOSCOPY 09/09/2018: COLONOSCOPY WITH PROPOFOL; N/A      Comment:  Procedure: COLONOSCOPY WITH PROPOFOL;  Surgeon:               Christena Deem, MD;  Location: [facility] ENDOSCOPY;                Service: Endoscopy;  Laterality: N/A;     Reproductive/Obstetrics negative OB ROS                              Anesthesia Physical Anesthesia Plan  ASA: 3  Anesthesia Plan: General   Post-op Pain Management: Minimal or no pain anticipated   Induction: Intravenous  PONV Risk Score and Plan: 1 and Propofol infusion and TIVA  Airway Management Planned: Natural Airway and Nasal Cannula  Additional Equipment:   Intra-op Plan:   Post-operative Plan:   Informed Consent: I have reviewed the patients History and Physical, chart, labs and discussed the procedure including the risks, benefits and alternatives for the proposed anesthesia with the patient or authorized representative who has indicated his/her understanding and acceptance.     Dental Advisory Given  Plan Discussed with: Anesthesiologist, CRNA and Surgeon  Anesthesia Plan Comments: (Patient consented for risks of anesthesia including but not limited to:  - adverse reactions to medications - risk of airway placement if required - damage to eyes, teeth, lips or other oral mucosa - nerve damage due to positioning  - sore throat or hoarseness - Damage to heart, brain, nerves, lungs, other parts of body or  loss of life  Patient voiced understanding and assent.)         Anesthesia Quick Evaluation

## 2024-01-09 NOTE — Anesthesia Postprocedure Evaluation (Signed)
 Anesthesia Post Note  Patient: Jesus Peterson  Procedure(s) Performed: COLONOSCOPY WITH PROPOFOL POLYPECTOMY  Patient location during evaluation: Endoscopy Anesthesia Type: General Level of consciousness: awake and alert Pain management: pain level controlled Vital Signs Assessment: post-procedure vital signs reviewed and stable Respiratory status: spontaneous breathing, nonlabored ventilation, respiratory function stable and patient connected to nasal cannula oxygen Cardiovascular status: blood pressure returned to baseline and stable Postop Assessment: no apparent nausea or vomiting Anesthetic complications: no   No notable events documented.   Last Vitals:  Vitals:   01/09/24 0913 01/09/24 1006  BP: (!) 156/94 102/66  Pulse: 92 63  Resp: 18 19  Temp: (!) 35.8 C (!) 35.8 C  SpO2: 100% 98%    Last Pain:  Vitals:   01/09/24 1006  TempSrc: Temporal  PainSc: Asleep                 Louie Boston

## 2024-01-09 NOTE — Op Note (Signed)
 [facility] Gastroenterology Patient Name: Jesus Peterson Procedure Date: 01/09/2024 9:43 AM MRN: [id number] Account #: [id number] Date of Birth: [birth year] Admit Type: Outpatient Age: 78 Room: [facility] ENDO ROOM 2 Gender: Male Note Status: Finalized Instrument Name: Prentice Docker [id number] Procedure:             Colonoscopy Indications:           High risk colon cancer surveillance: Personal history                         of non-advanced adenoma Providers:             Boykin Nearing. Norma Fredrickson MD, MD Referring MD:          Danella Penton, MD (Referring MD) Medicines:             Propofol per Anesthesia Complications:         No immediate complications. Estimated blood loss:                         Minimal. Procedure:             Pre-Anesthesia Assessment:                        - The risks and benefits of the procedure and the                         sedation options and risks were discussed with the                         patient. All questions were answered and informed                         consent was obtained.                        - Patient identification and proposed procedure were                         verified prior to the procedure by the nurse. The                         procedure was verified in the procedure room.                        - ASA Grade Assessment: III - A patient with severe                         systemic disease.                        - After reviewing the risks and benefits, the patient                         was deemed in satisfactory condition to undergo the                         procedure.                        After obtaining informed consent, the  colonoscope was                         passed under direct vision. Throughout the procedure,                         the patient's blood pressure, pulse, and oxygen                         saturations were monitored continuously. The                         Colonoscope was introduced  through the anus and                         advanced to the the cecum, identified by appendiceal                         orifice and ileocecal valve. The colonoscopy was                         performed without difficulty. The patient tolerated                         the procedure well. The quality of the bowel                         preparation was adequate. The ileocecal valve,                         appendiceal orifice, and rectum were photographed. Findings:      The perianal and digital rectal examinations were normal. Pertinent       negatives include normal sphincter tone and no palpable rectal lesions.      Many medium-mouthed and small-mouthed diverticula were found in the       sigmoid colon and descending colon.      A 6 mm polyp was found in the ascending colon. The polyp was sessile.       The polyp was removed with a cold snare. Resection and retrieval were       complete.      Internal hemorrhoids were found during retroflexion. The hemorrhoids       were Grade I (internal hemorrhoids that do not prolapse).      The exam was otherwise without abnormality. Impression:            - Diverticulosis in the sigmoid colon and in the                         descending colon.                        - One 6 mm polyp in the ascending colon, removed with                         a cold snare. Resected and retrieved.                        - Internal hemorrhoids.                        -  The examination was otherwise normal. Recommendation:        - Patient has a contact number available for                         emergencies. The signs and symptoms of potential                         delayed complications were discussed with the patient.                         Return to normal activities tomorrow. Written                         discharge instructions were provided to the patient.                        - Resume previous diet.                        - Continue present  medications.                        - Await pathology results.                        - If polyps are benign or adenomatous without                         dysplasia, I will advise NO further colonoscopy due to                         advanced age and/or severe comorbidity.                        - Return to GI office PRN.                        - The findings and recommendations were discussed with                         the patient. Procedure Code(s):     --- Professional ---                        [phone], Colonoscopy, flexible; with removal of                         tumor(s), polyp(s), or other lesion(s) by snare                         technique Diagnosis Code(s):     --- Professional ---                        K57.30, Diverticulosis of large intestine without                         perforation or abscess without bleeding                        D12.2, Benign neoplasm of ascending colon  K64.0, First degree hemorrhoids                        Z86.010, Personal history of colonic polyps CPT copyright 2022 American Medical Association. All rights reserved. The codes documented in this report are preliminary and upon coder review may  be revised to meet current compliance requirements. Stanton Kidney MD, MD 01/09/2024 10:12:27 AM This report has been signed electronically. Number of Addenda: 0 Note Initiated On: 01/09/2024 9:43 AM Scope Withdrawal Time: 0 hours 8 minutes 43 seconds  Total Procedure Duration: 0 hours 11 minutes 54 seconds  Estimated Blood Loss:  Estimated blood loss was minimal.      [facility]

## 2024-01-09 NOTE — Interval H&P Note (Signed)
 History and Physical Interval Note:  01/09/2024 9:30 AM  Jesus Peterson  has presented today for surgery, with the diagnosis of Z86.0100 (ICD-10-CM) - History of colon polyps.  The various methods of treatment have been discussed with the patient and family. After consideration of risks, benefits and other options for treatment, the patient has consented to  Procedure(s): COLONOSCOPY WITH PROPOFOL (N/A) as a surgical intervention.  The patient's history has been reviewed, patient examined, no change in status, stable for surgery.  I have reviewed the patient's chart and labs.  Questions were answered to the patient's satisfaction.     Chandler, Chrisman

## 2024-01-09 NOTE — Transfer of Care (Signed)
 Immediate Anesthesia Transfer of Care Note  Patient: Jesus Peterson  Procedure(s) Performed: COLONOSCOPY WITH PROPOFOL POLYPECTOMY  Patient Location: PACU and Endoscopy Unit  Anesthesia Type:General  Level of Consciousness: sedated  Airway & Oxygen Therapy: Patient Spontanous Breathing and Patient connected to nasal cannula oxygen  Post-op Assessment: Report given to RN and Post -op Vital signs reviewed and stable  Post vital signs: Reviewed and stable  Last Vitals:  Vitals Value Taken Time  BP 102/66 01/09/24 1006  Temp 35.8 C 01/09/24 1006  Pulse 63 01/09/24 1006  Resp 19 01/09/24 1006  SpO2 98 % 01/09/24 1006    Last Pain:  Vitals:   01/09/24 1006  TempSrc: Temporal  PainSc: Asleep         Complications: No notable events documented.

## 2024-01-09 NOTE — H&P (Signed)
 Outpatient short stay form Pre-procedure 01/09/2024 9:29 AM Trannie Bardales K. Norma Fredrickson, M.D.  Primary Physician: Bethann Punches, M.D.    Reason for visit:  Personal history of colon polyps  History of present illness:  Personal history of colon polyps-due for repeat colonoscopy based on history of tubular adenomas. Given age we will plan to schedule this colonoscopy but likely will be last colonoscopy. He denies family history of colon cancer or any GI symptoms.     Current Facility-Administered Medications:    0.9 %  sodium chloride infusion, , Intravenous, Continuous, Clydean Posas, Boykin Nearing, MD  Medications Prior to Admission  Medication Sig Dispense Refill Last Dose/Taking   amLODipine (NORVASC) 10 MG tablet TAKE 1 TABLET EVERY DAY 90 tablet 2 Past Week   blood glucose meter kit and supplies KIT Dispense based on patient and insurance preference. Check once daily fasting, Dx Code E11.9 1 each 0 01/08/2024   Blood Pressure Monitoring (ADULT BLOOD PRESSURE CUFF LG) KIT Use daily to check blood pressure 1 each 0 01/08/2024   carvedilol (COREG) 12.5 MG tablet TAKE 1 AND 1/2 TABLETS TWICE DAILY WITH MEALS 270 tablet 0 Past Week   empagliflozin (JARDIANCE) 25 MG TABS tablet Take 12.5 mg by mouth daily.   Past Week   fluocinonide (LIDEX) 0.05 % external solution Spot treat affected areas scalp once to twice daily as needed for itch. Avoid applying to face, groin, and axilla. Use as directed. Long-term use can cause thinning of the skin. 60 mL 2 Past Month   GARLIC OIL PO Take 1 capsule by mouth 4 (four) times daily.   Past Week   glucose blood (CONTOUR NEXT TEST) test strip USE  STRIP TO CHECK GLUCOSE TWICE DAILY E11.9. 100 each 2 01/08/2024   hydrochlorothiazide (HYDRODIURIL) 12.5 MG tablet TAKE 1 TABLET EVERY DAY 90 tablet 1 Past Week   hydrocortisone 2.5 % cream Apply topically as directed daily up to 4 days per week to affected areas of face and under arms as needed for flare. 84 g 1 01/08/2024   ketoconazole  (NIZORAL) 2 % cream Apply to affected areas face and ears 1-2 times a day. 60 g 11 Past Week   losartan (COZAAR) 100 MG tablet Take 1 tablet (100 mg total) by mouth daily. 90 tablet 2 Past Week   meclizine (ANTIVERT) 25 MG tablet Take 1 tablet (25 mg total) by mouth 3 (three) times daily as needed for dizziness. 20 tablet 0 Past Week   Microlet Lancets MISC USE TO TEST BLOOD SUGAR TWICE DAILY 100 each 0 01/08/2024   pimecrolimus (ELIDEL) 1 % cream Apply to scaly areas on face once to twice a day, as needed. 60 g 3 Past Week   rosuvastatin (CRESTOR) 20 MG tablet TAKE 1 TABLET EVERY DAY 90 tablet 1 Past Week   doxycycline (MONODOX) 100 MG capsule Take 1 capsule (100 mg total) by mouth 2 (two) times daily. Take with food (Patient not taking: Reported on 08/02/2023) 60 capsule 0      Allergies  Allergen Reactions   Contrast Media [Iodinated Contrast Media]    Metformin Nausea Only and Other (See Comments)   Penicillins      Past Medical History:  Diagnosis Date   Actinic keratosis 08/18/2010   Left forearm. Hypertrophic.   Cancer (HCC)    MELANOMA OF SKIN   Chronic kidney disease    Dysplastic nevus 07/06/2010   Right back. Moderate atypia.    Erectile dysfunction    History of kidney stones  Hyperlipidemia    Hypertension    Melanoma (HCC) 10/17/2017   L infraorbitial cheek, Melanoma IS txted with MOHs Dr. Adriana Simas, pathology in media   Seborrheic dermatitis    Squamous cell carcinoma of skin ?   R hand, txted by Dr. Cheree Ditto   Treadmill stress test negative for angina pectoris 2013   Dr. Gwen Pounds, normal per pt   Valvular heart disease    Venous angioma of brain (HCC)    Pons, found 2/2 tinnitus, followed on MRI    Review of systems:  Otherwise negative.    Physical Exam  Gen: Alert, oriented. Appears stated age.  HEENT: [place]/AT. PERRLA. Lungs: CTA, no wheezes. CV: RR nl S1, S2. Abd: soft, benign, no masses. BS+ Ext: No edema. Pulses 2+    Planned procedures: Proceed  with colonoscopy. The patient understands the nature of the planned procedure, indications, risks, alternatives and potential complications including but not limited to bleeding, infection, perforation, damage to internal organs and possible oversedation/side effects from anesthesia. The patient agrees and gives consent to proceed.  Please refer to procedure notes for findings, recommendations and patient disposition/instructions.     Almas Rake K. Norma Fredrickson, M.D. Gastroenterology 01/09/2024  9:29 AM

## 2024-01-10 ENCOUNTER — Encounter: Payer: Self-pay | Admitting: Internal Medicine

## 2024-01-10 LAB — SURGICAL PATHOLOGY

## 2024-01-29 ENCOUNTER — Ambulatory Visit (INDEPENDENT_AMBULATORY_CARE_PROVIDER_SITE_OTHER): Admitting: Dermatology

## 2024-01-29 DIAGNOSIS — L2389 Allergic contact dermatitis due to other agents: Secondary | ICD-10-CM

## 2024-01-29 DIAGNOSIS — T63791A Toxic effect of contact with other venomous plant, accidental (unintentional), initial encounter: Secondary | ICD-10-CM | POA: Diagnosis not present

## 2024-01-29 MED ORDER — CLOBETASOL PROPIONATE 0.05 % EX CREA: 1.0000 | TOPICAL_CREAM | Freq: Two times a day (BID) | CUTANEOUS | 0 refills | Status: AC

## 2024-01-29 NOTE — Progress Notes (Signed)
   Follow-Up Visit   Subjective  Jesus Peterson is a 78 y.o. male who presents for the following: Patient reports he was pulling some vines and got into some poison ivy, patient is also on Jardiance  and has been on that for a long long time but states he did read you can develop a rash from being on it. Patient has been applying Ivarest, and 2% cortisone, blue-emu 1% hydrocortisone . No tick bites   The following portions of the chart were reviewed this encounter and updated as appropriate: medications, allergies, medical history  Review of Systems:  No other skin or systemic complaints except as noted in HPI or Assessment and Plan.  Objective  Well appearing patient in no apparent distress; mood and affect are within normal limits.   A focused examination was performed of the following areas: Torso  Relevant exam findings are noted in the Assessment and Plan.      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 Patient reports prior lesions on forearms that have cleared (none on exam)  Patient denies any headaches myalgia arthralgia.   Treatment Plan: Use Clobetasol  0.05% cream BID to aa, until smooth or flat then discontinue. If still present after two weeks return to clinic and consider biopsy. Avoid applying to face, groin, and axilla. Use as directed. Long-term use can cause thinning of the skin.  Topical steroids (such as triamcinolone , fluocinolone, fluocinonide , mometasone, clobetasol , halobetasol, betamethasone, hydrocortisone ) can cause thinning and lightening of the skin if they are used for too long in the same area. Your physician has selected the right strength medicine for your problem and area affected on the body. Please use your medication only as directed by your physician to prevent side effects.    ALLERGIC CONTACT DERMATITIS DUE TO OTHER  AGENTS    Return in about 2 weeks (around 02/12/2024) for w/ Dr. Felipe Horton.  I, Jacquelynn Vera, CMA, am acting as scribe for Harris Liming, MD .   Documentation: I have reviewed the above documentation for accuracy and completeness, and I agree with the above.  Harris Liming, MD

## 2024-01-29 NOTE — Patient Instructions (Addendum)
 Use Clobetasol 0.05% cream BID to aa, until smooth or flat then discontinue. If still present after two weeks return to clinic and consider biopsy. Avoid applying to face, groin, and axilla. Use as directed. Long-term use can cause thinning of the skin.   Due to recent changes in healthcare laws, you may see results of your pathology and/or laboratory studies on MyChart before the doctors have had a chance to review them. We understand that in some cases there may be results that are confusing or concerning to you. Please understand that not all results are received at the same time and often the doctors may need to interpret multiple results in order to provide you with the best plan of care or course of treatment. Therefore, we ask that you please give [place] 2 business days to thoroughly review all your results before contacting the office for clarification. Should we see a critical lab result, you will be contacted sooner.   If You Need Anything After Your Visit  If you have any questions or concerns for your doctor, please call our main line at [phone] and press option 4 to reach your doctor's medical assistant. If no one answers, please leave a voicemail as directed and we will return your call as soon as possible. Messages left after 4 pm will be answered the following business day.   You may also send [place] a message via MyChart. We typically respond to MyChart messages within 1-2 business days.  For prescription refills, please ask your pharmacy to contact our office. Our fax number is [phone].  If you have an urgent issue when the clinic is closed that cannot wait until the next business day, you can page your doctor at the number below.    Please note that while we do our best to be available for urgent issues outside of office hours, we are not available 24/7.   If you have an urgent issue and are unable to reach [place], you may choose to seek medical care at your doctor's office, retail  clinic, urgent care center, or emergency room.  If you have a medical emergency, please immediately call 911 or go to the emergency department.  Pager Numbers  - Dr. Gwen Pounds: [phone]  - Dr. Roseanne Reno: [phone]  - Dr. Katrinka Blazing: [phone]   In the event of inclement weather, please call our main line at [phone] for an update on the status of any delays or closures.  Dermatology Medication Tips: Please keep the boxes that topical medications come in in order to help keep track of the instructions about where and how to use these. Pharmacies typically print the medication instructions only on the boxes and not directly on the medication tubes.   If your medication is too expensive, please contact our office at [phone] option 4 or send [place] a message through MyChart.   We are unable to tell what your co-pay for medications will be in advance as this is different depending on your insurance coverage. However, we may be able to find a substitute medication at lower cost or fill out paperwork to get insurance to cover a needed medication.   If a prior authorization is required to get your medication covered by your insurance company, please allow [place] 1-2 business days to complete this process.  Drug prices often vary depending on where the prescription is filled and some pharmacies may offer cheaper prices.  The website www.goodrx.com contains coupons for medications through different pharmacies. The prices here do  not account for what the cost may be with help from insurance (it may be cheaper with your insurance), but the website can give you the price if you did not use any insurance.  - You can print the associated coupon and take it with your prescription to the pharmacy.  - You may also stop by our office during regular business hours and pick up a GoodRx coupon card.  - If you need your prescription sent electronically to a different pharmacy, notify our office through [facility] or by phone at [phone] option 4.     Si Usted Necesita Algo Despus de Su Visita  Tambin puede enviarnos un mensaje a travs de Clinical cytogeneticist. Por lo general respondemos a los mensajes de MyChart en el transcurso de 1 a 2 das hbiles.  Para renovar recetas, por favor pida a su farmacia que se ponga en contacto con nuestra oficina. Annie Sable de fax es [place] [phone].  Si tiene un asunto urgente cuando la clnica est cerrada y que no puede esperar hasta el siguiente da hbil, puede llamar/localizar a su doctor(a) al nmero que aparece a continuacin.   Por favor, tenga en cuenta que aunque hacemos todo lo posible para estar disponibles para asuntos urgentes fuera del horario de [place], no estamos disponibles las 24 horas del da, los 7 [address]  [address] de la [place].   Si tiene un problema urgente y no puede comunicarse con nosotros, puede optar por buscar atencin mdica  en el consultorio de su doctor(a), en una clnica privada, en un centro de atencin urgente o en una sala de emergencias.  Si tiene Engineer, drilling, por favor llame inmediatamente al 911 o vaya a la sala de emergencias.  Nmeros de bper  - Dr. Gwen Pounds: [phone]  - Dra. Roseanne Reno: [phone]  - Dr. Katrinka Blazing: [phone]   En caso de inclemencias del tiempo, por favor llame a Lacy Duverney principal al [phone] para una actualizacin sobre el [place] de cualquier retraso o cierre.  Consejos para la medicacin en dermatologa: Por favor, guarde las cajas en las que vienen los medicamentos de uso tpico para ayudarle a seguir las instrucciones sobre dnde y cmo usarlos. Las farmacias generalmente imprimen las instrucciones del medicamento slo en las cajas y no directamente en los tubos del [place].   Si su medicamento es muy caro, por favor, pngase en contacto con Rolm Gala llamando al [phone] y presione la opcin 4 o envenos un mensaje a travs de Clinical cytogeneticist.   No podemos  decirle cul ser su copago por los medicamentos por adelantado ya que esto es diferente dependiendo de la cobertura de su seguro. Sin embargo, es posible que podamos encontrar un medicamento sustituto a Audiological scientist un formulario para que el seguro cubra el medicamento que se considera necesario.   Si se requiere una autorizacin previa para que su compaa de seguros [place] su medicamento, por favor permtanos de 1 a 2 das hbiles para completar [address]  Los precios de los medicamentos varan con frecuencia dependiendo del Environmental consultant de dnde se surte la receta y alguna farmacias pueden ofrecer precios ms baratos.  El sitio web www.goodrx.com tiene cupones para medicamentos de Health and safety inspector. Los precios aqu no tienen en cuenta lo que podra costar con la ayuda del seguro (puede ser ms barato con su seguro), pero el sitio web puede darle el precio si no utiliz Tourist information centre manager.  - Puede imprimir el cupn correspondiente y llevarlo con su receta a la farmacia.  -  Tambin puede pasar por nuestra oficina durante el horario de atencin regular y Education officer, museum una tarjeta de cupones de GoodRx.  - Si necesita que su receta se enve electrnicamente a una farmacia diferente, informe a nuestra oficina a travs de MyChart de [place] o por telfono llamando al [phone] y presione la opcin 4.

## 2024-02-02 ENCOUNTER — Encounter: Payer: Self-pay | Admitting: Dermatology

## 2024-02-14 ENCOUNTER — Ambulatory Visit: Admitting: Dermatology

## 2024-02-14 ENCOUNTER — Encounter: Payer: Self-pay | Admitting: Dermatology

## 2024-02-14 DIAGNOSIS — T63791D Toxic effect of contact with other venomous plant, accidental (unintentional), subsequent encounter: Secondary | ICD-10-CM | POA: Diagnosis not present

## 2024-02-14 DIAGNOSIS — L2389 Allergic contact dermatitis due to other agents: Secondary | ICD-10-CM

## 2024-02-14 NOTE — Patient Instructions (Signed)

## 2024-02-14 NOTE — Progress Notes (Signed)
   Follow-Up Visit   Subjective  Jesus Peterson is a 78 y.o. 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   The following portions of the chart were reviewed this encounter and updated as appropriate: medications, allergies, medical history  Review of Systems:  No other skin or systemic complaints except as noted in HPI or Assessment and Plan.  Objective  Well appearing patient in no apparent distress; mood and affect are within normal limits.   A focused examination was performed of the following areas: abdomen  Relevant exam findings are noted in the Assessment and Plan.    Assessment & Plan   ALLERGIC CONTACT DERMATITIS Secondary to toxicodendron L abdomen Exam: L abdomen clear  Chronic and persistent condition with duration or expected duration over one year. Condition is improving with treatment but not currently at goal.   Treatment Plan: Resolved with Clobetasol . RTC if it recurs  ALLERGIC CONTACT DERMATITIS DUE TO OTHER AGENTS    Return for as scheduled with Dr. Annette Barters for TBSE, hxof Melanoma IS, SCC, Dysplastic Nevus, AKs.  I, Sonya Hupman, RMA, am acting as scribe for Harris Liming, MD .   Documentation: I have reviewed the above documentation for accuracy and completeness, and I agree with the above.  Harris Liming, MD

## 2024-02-19 ENCOUNTER — Ambulatory Visit: Payer: Medicare Other | Admitting: Dermatology

## 2024-02-19 ENCOUNTER — Other Ambulatory Visit: Payer: Self-pay | Admitting: Dermatology

## 2024-02-19 DIAGNOSIS — D2239 Melanocytic nevi of other parts of face: Secondary | ICD-10-CM

## 2024-02-19 DIAGNOSIS — Z1283 Encounter for screening for malignant neoplasm of skin: Secondary | ICD-10-CM

## 2024-02-19 DIAGNOSIS — L820 Inflamed seborrheic keratosis: Secondary | ICD-10-CM | POA: Diagnosis not present

## 2024-02-19 DIAGNOSIS — W908XXA Exposure to other nonionizing radiation, initial encounter: Secondary | ICD-10-CM | POA: Diagnosis not present

## 2024-02-19 DIAGNOSIS — L814 Other melanin hyperpigmentation: Secondary | ICD-10-CM

## 2024-02-19 DIAGNOSIS — L219 Seborrheic dermatitis, unspecified: Secondary | ICD-10-CM

## 2024-02-19 DIAGNOSIS — Z85828 Personal history of other malignant neoplasm of skin: Secondary | ICD-10-CM

## 2024-02-19 DIAGNOSIS — Z86006 Personal history of melanoma in-situ: Secondary | ICD-10-CM

## 2024-02-19 DIAGNOSIS — D229 Melanocytic nevi, unspecified: Secondary | ICD-10-CM

## 2024-02-19 DIAGNOSIS — D1801 Hemangioma of skin and subcutaneous tissue: Secondary | ICD-10-CM

## 2024-02-19 DIAGNOSIS — Q825 Congenital non-neoplastic nevus: Secondary | ICD-10-CM

## 2024-02-19 DIAGNOSIS — Z86018 Personal history of other benign neoplasm: Secondary | ICD-10-CM

## 2024-02-19 DIAGNOSIS — L821 Other seborrheic keratosis: Secondary | ICD-10-CM

## 2024-02-19 DIAGNOSIS — L578 Other skin changes due to chronic exposure to nonionizing radiation: Secondary | ICD-10-CM | POA: Diagnosis not present

## 2024-02-19 MED ORDER — HYDROCORTISONE 2.5 % EX CREA: TOPICAL_CREAM | CUTANEOUS | 6 refills | Status: DC

## 2024-02-19 MED ORDER — KETOCONAZOLE 2 % EX CREA: TOPICAL_CREAM | CUTANEOUS | 11 refills | Status: DC

## 2024-02-19 NOTE — Progress Notes (Signed)
 Follow-Up Visit   Subjective  Jesus Peterson is a 78 y.o. male who presents for the following: Skin Cancer Screening and Full Body Skin Exam  The patient presents for Total-Body Skin Exam (TBSE) for skin cancer screening and mole check. The patient has spots, moles and lesions to be evaluated, some may be new or changing. History of melanoma in situ of the left infraorbital cheek 2019 treated with Mohs. History of SCC of the right hand.     The following portions of the chart were reviewed this encounter and updated as appropriate: medications, allergies, medical history  Review of Systems:  No other skin or systemic complaints except as noted in HPI or Assessment and Plan.  Objective  Well appearing patient in no apparent distress; mood and affect are within normal limits.  A full examination was performed including scalp, head, eyes, ears, nose, lips, neck, chest, axillae, abdomen, back, bilateral upper extremities, bilateral lower extremities, hands, fingers, and fingernails. All findings within normal limits unless otherwise noted below.   Relevant physical exam findings are noted in the Assessment and Plan.  Mid upper back x 3 (3) Erythematous stuck-on, waxy papule  Assessment & Plan   SKIN CANCER SCREENING PERFORMED TODAY.  ACTINIC DAMAGE - Chronic condition, secondary to cumulative UV/sun exposure - diffuse scaly erythematous macules with underlying dyspigmentation - Recommend daily broad spectrum sunscreen SPF 30+ to sun-exposed areas, reapply every 2 hours as needed.  - Staying in the shade or wearing long sleeves, sun glasses (UVA+UVB protection) and wide brim hats (4-inch brim around the entire circumference of the hat) are also recommended for sun protection.  - Call for new or changing lesions.  LENTIGINES, SEBORRHEIC KERATOSES, HEMANGIOMAS - Benign normal skin lesions - Benign-appearing - Call for any changes  MELANOCYTIC NEVI - Tan-brown and/or  pink-flesh-colored symmetric macules and papules - 0.6 cm flesh papule, present for years without changes per pt - Left nasal dorsum - 1.5 mm blue macule - Right upper temple c/w blue nevus - Benign appearing on exam today - Observation - Call clinic for new or changing moles - Recommend daily use of broad spectrum spf 30+ sunscreen to sun-exposed areas.   History of Dysplastic Nevus R back, 2011 - No evidence of recurrence today - Recommend regular full body skin exams - Recommend daily broad spectrum sunscreen SPF 30+ to sun-exposed areas, reapply every 2 hours as needed.  - Call if any new or changing lesions are noted between office visits   HISTORY OF SQUAMOUS CELL CARCINOMA OF THE SKIN R hand, tx by Dr. Tyrone Gallop - No evidence of recurrence today - Recommend regular full body skin exams - Recommend daily broad spectrum sunscreen SPF 30+ to sun-exposed areas, reapply every 2 hours as needed.  - Call if any new or changing lesions are noted between office visits   HISTORY OF MELANOMA IN SITU - Mohs 2019 [facility] - No evidence of recurrence today L infraorbital cheek - Recommend regular full body skin exams - Recommend daily broad spectrum sunscreen SPF 30+ to sun-exposed areas, reapply every 2 hours as needed.  - Call if any new or changing lesions are noted between office visits   HEMANGIOMA Exam:  0.6 cm violaceous papule of the left occipital scalp  Discussed benign nature. Recommend observation. Call for changes.   CONGENITAL NEVUS Exam: 5.5 x 3.0 cm speckled brown patch with hypertrichosis of the right lower hip   Treatment Plan: Benign appearing on exam today. Recommend observation. Call  clinic for new or changing moles. Recommend daily use of broad spectrum spf 30+ sunscreen to sun-exposed areas.   SEBORRHEIC DERMATITIS Exam: Pink patches with greasy scale at scalp, face  Chronic and persistent condition with duration or expected duration over one year. Condition is  symptomatic/ bothersome to patient. Not currently at goal.   Seborrheic Dermatitis is a chronic persistent rash characterized by pinkness and scaling most commonly of the mid face but also can occur on the scalp (dandruff), ears; mid chest, mid back and groin.  It tends to be exacerbated by stress and cooler weather.  People who have neurologic disease may experience new onset or exacerbation of existing seborrheic dermatitis.  The condition is not curable but treatable and can be controlled.  Treatment Plan: Continue hydrocortisone  2.5% cream once to twice daily to aa as directed. Continue Ketoconazole  2% Cream Apply to scaly areas face once to twice daily as needed. Pt has.  Continue OTC Head & Shoulders shampoo 2-3x per week, massage into scalp and let sit 3-5 minutes before rinsing. Continue fluocinonide  solution spot treat AA scalp once to twice daily as needed for itch. Avoid applying to face, groin, and axilla dsp 60mL 2Rf.. Use as directed. Long-term use can cause thinning of the skin.   INFLAMED SEBORRHEIC KERATOSIS (3) Mid upper back x 3 (3) Symptomatic, irritating, patient would like treated. Destruction of lesion - Mid upper back x 3 (3)  Destruction method: cryotherapy   Informed consent: discussed and consent obtained   Lesion destroyed using liquid nitrogen: Yes   Region frozen until ice ball extended beyond lesion: Yes   Outcome: patient tolerated procedure well with no complications   Post-procedure details: wound care instructions given   Additional details:  Prior to procedure, discussed risks of blister formation, small wound, skin dyspigmentation, or rare scar following cryotherapy. Recommend Vaseline ointment to treated areas while healing.  SEBORRHEIC DERMATITIS   Related Medications ketoconazole  (NIZORAL ) 2 % cream Apply to affected areas face and ears 1-2 times a day. hydrocortisone  2.5 % cream Apply topically as directed to affected areas face once to twice  daily. Return in about 6 months (around 08/21/2024) for UBSE, Hx melanoma in situ.  IBernardine Bridegroom, CMA, am acting as scribe for Artemio Larry, MD .   Documentation: I have reviewed the above documentation for accuracy and completeness, and I agree with the above.  Artemio Larry, MD

## 2024-02-19 NOTE — Patient Instructions (Addendum)

## 2024-08-13 DIAGNOSIS — L570 Actinic keratosis: Secondary | ICD-10-CM

## 2024-08-13 HISTORY — DX: Actinic keratosis: L57.0

## 2024-09-01 ENCOUNTER — Ambulatory Visit: Admitting: Dermatology

## 2024-09-01 ENCOUNTER — Encounter: Payer: Self-pay | Admitting: Dermatology

## 2024-09-01 DIAGNOSIS — Z1283 Encounter for screening for malignant neoplasm of skin: Secondary | ICD-10-CM | POA: Diagnosis not present

## 2024-09-01 DIAGNOSIS — L820 Inflamed seborrheic keratosis: Secondary | ICD-10-CM | POA: Diagnosis not present

## 2024-09-01 DIAGNOSIS — W908XXA Exposure to other nonionizing radiation, initial encounter: Secondary | ICD-10-CM | POA: Diagnosis not present

## 2024-09-01 DIAGNOSIS — D2239 Melanocytic nevi of other parts of face: Secondary | ICD-10-CM

## 2024-09-01 DIAGNOSIS — L219 Seborrheic dermatitis, unspecified: Secondary | ICD-10-CM | POA: Diagnosis not present

## 2024-09-01 DIAGNOSIS — Z85828 Personal history of other malignant neoplasm of skin: Secondary | ICD-10-CM

## 2024-09-01 DIAGNOSIS — L578 Other skin changes due to chronic exposure to nonionizing radiation: Secondary | ICD-10-CM | POA: Diagnosis not present

## 2024-09-01 DIAGNOSIS — Z86006 Personal history of melanoma in-situ: Secondary | ICD-10-CM

## 2024-09-01 DIAGNOSIS — D3610 Benign neoplasm of peripheral nerves and autonomic nervous system, unspecified: Secondary | ICD-10-CM

## 2024-09-01 DIAGNOSIS — L814 Other melanin hyperpigmentation: Secondary | ICD-10-CM

## 2024-09-01 DIAGNOSIS — L821 Other seborrheic keratosis: Secondary | ICD-10-CM

## 2024-09-01 DIAGNOSIS — Z86018 Personal history of other benign neoplasm: Secondary | ICD-10-CM

## 2024-09-01 DIAGNOSIS — D229 Melanocytic nevi, unspecified: Secondary | ICD-10-CM

## 2024-09-01 DIAGNOSIS — D1801 Hemangioma of skin and subcutaneous tissue: Secondary | ICD-10-CM

## 2024-09-01 DIAGNOSIS — Q825 Congenital non-neoplastic nevus: Secondary | ICD-10-CM

## 2024-09-01 MED ORDER — FLUOCINONIDE 0.05 % EX SOLN: CUTANEOUS | 2 refills | Status: AC

## 2024-09-01 MED ORDER — HYDROCORTISONE 2.5 % EX CREA: TOPICAL_CREAM | CUTANEOUS | 6 refills | Status: AC

## 2024-09-01 NOTE — Progress Notes (Signed)
 Follow-Up Visit   Subjective  Jesus Peterson is a 78 y.o. male who presents for the following: Skin Cancer Screening and Upper Body Skin Exam. History of melanoma in situ, L infraorbital cheek,  10/2017. Hx of SCC, [place], [place].  The patient presents for Upper Body Skin Exam (UBSE) for skin cancer screening and mole check. The patient has spots, moles and lesions to be evaluated, some may be new or changing. The patient had a spot on his right forearm that came up. He was not able to get appointment here, so he saw Dr. Arlyss for biopsy. He has appointment tomorrow to freeze precancerous area. He has several other irritated growths he will point out.   The following portions of the chart were reviewed this encounter and updated as appropriate: medications, allergies, medical history  Review of Systems:  No other skin or systemic complaints except as noted in HPI or Assessment and Plan.  Objective  Well appearing patient in no apparent distress; mood and affect are within normal limits.  All skin waist up examined. Relevant physical exam findings are noted in the Assessment and Plan.  L chest x 1, L forearm x 1, R lower neck x 6, R post shoulder x 1 (9) Erythematous stuck-on, waxy papule  Assessment & Plan   SEBORRHEIC DERMATITIS   Related Medications ketoconazole  (NIZORAL ) 2 % cream APPLY 2 GRAMS TO AFFECTED AREAS (FACE AND EARS 1-2 TIMES A DAY) hydrocortisone  2.5 % cream Apply topically as directed to affected areas face once to twice daily. fluocinonide  (LIDEX ) 0.05 % external solution Spot treat affected areas scalp once to twice daily as needed for itch. Avoid applying to face, groin, and axilla. Use as directed. Long-term use can cause thinning of the skin. INFLAMED SEBORRHEIC KERATOSIS (9) L chest x 1, L forearm x 1, R lower neck x 6, R post shoulder x 1 (9) Symptomatic, irritating, patient would like treated. Destruction of lesion - L chest x 1, L forearm x 1, R lower neck x  6, R post shoulder x 1 (9)  Destruction method: cryotherapy   Informed consent: discussed and consent obtained   Lesion destroyed using liquid nitrogen: Yes   Region frozen until ice ball extended beyond lesion: Yes   Outcome: patient tolerated procedure well with no complications   Post-procedure details: wound care instructions given   Additional details:  Prior to procedure, discussed risks of blister formation, small wound, skin dyspigmentation, or rare scar following cryotherapy. Recommend Vaseline ointment to treated areas while healing.   Skin cancer screening performed today.  Actinic Damage - Chronic condition, secondary to cumulative UV/sun exposure - diffuse scaly erythematous macules with underlying dyspigmentation - Recommend daily broad spectrum sunscreen SPF 30+ to sun-exposed areas, reapply every 2 hours as needed.  - Staying in the shade or wearing long sleeves, sun glasses (UVA+UVB protection) and wide brim hats (4-inch brim around the entire circumference of the hat) are also recommended for sun protection.  - Call for new or changing lesions.  Lentigines, Seborrheic Keratoses, Hemangiomas - Benign normal skin lesions - Benign-appearing - Call for any changes  Melanocytic Nevi - Tan-brown and/or pink-flesh-colored symmetric macules and papules - 0.6 cm flesh papule, present for years without changes per pt - Left nasal dorsum - 1.5 mm blue macule - Right upper temple c/w blue nevus - Benign appearing on exam today, Stable. - Observation - Call clinic for new or changing moles - Recommend daily use of broad spectrum spf 30+  sunscreen to sun-exposed areas.   History of Dysplastic Nevus R back, 2011 - No evidence of recurrence today - Recommend regular full body skin exams - Recommend daily broad spectrum sunscreen SPF 30+ to sun-exposed areas, reapply every 2 hours as needed.  - Call if any new or changing lesions are noted between office visits   HISTORY OF  SQUAMOUS CELL CARCINOMA OF THE SKIN R hand, tx by Dr. Arlyss - No evidence of recurrence today - Recommend regular full body skin exams - Recommend daily broad spectrum sunscreen SPF 30+ to sun-exposed areas, reapply every 2 hours as needed.  - Call if any new or changing lesions are noted between office visits   HISTORY OF MELANOMA IN SITU - Mohs 2019 [facility] - No evidence of recurrence today L infraorbital cheek - Recommend regular full body skin exams - Recommend daily broad spectrum sunscreen SPF 30+ to sun-exposed areas, reapply every 2 hours as needed.  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 Recommend observation. Call clinic for new or changing moles. Recommend daily use of broad spectrum spf 30+ sunscreen to sun-exposed areas.   SEBORRHEIC DERMATITIS Exam: Pink patches with greasy scale at scalp, face   Chronic and persistent condition with duration or expected duration over one year. Condition is symptomatic/ bothersome to patient. Not currently at goal.     Seborrheic Dermatitis is a chronic persistent rash characterized by pinkness and scaling most commonly of the mid face but also can occur on the scalp (dandruff), ears; mid chest, mid back and groin.  It tends to be exacerbated by stress and cooler weather.  People who have neurologic disease may experience new onset or exacerbation of existing seborrheic dermatitis.  The condition is not curable but treatable and can be controlled.   Treatment Plan: Sample of CeraVe SA Cream given.  Continue hydrocortisone  2.5% cream once to twice daily to aa as directed. Continue Ketoconazole  2% Cream Apply to scaly areas face once to twice daily as needed. Pt has.  Continue OTC Head & Shoulders shampoo 2-3x per week, massage into scalp and let sit 3-5 minutes  before rinsing. Continue fluocinonide  solution spot treat AA scalp once to twice daily as needed for itch. Avoid applying to face, groin, and axilla dsp 60mL 2Rf.. Use as directed. Long-term use can cause thinning of the skin.   NEUROFIBROMA Exam: Soft flesh papule at the right lower back.  Benign, observe.    Return in about 6 months (around 03/01/2025) for Hx melanoma IS, UBSE.  IAndrea Kerns, CMA, am acting as scribe for Rexene Rattler, MD .   Documentation: I have reviewed the above documentation for accuracy and completeness, and I agree with the above.  Rexene Rattler, MD

## 2024-09-01 NOTE — Patient Instructions (Addendum)

## 2024-09-02 NOTE — Telephone Encounter (Signed)
 open in error

## 2024-09-09 ENCOUNTER — Ambulatory Visit: Payer: Self-pay | Admitting: Dermatology

## 2025-03-02 ENCOUNTER — Ambulatory Visit: Admitting: Dermatology
